# Patient Record
Sex: Female | Born: 1975 | Race: White | Hispanic: No | Marital: Married | Smoking: Current every day smoker
Health system: Southern US, Community
[De-identification: ages and names within clinical notes are randomized; demographics above are authoritative.]

## PROBLEM LIST (undated history)

## (undated) ENCOUNTER — Ambulatory Visit: Admission: EM | Payer: Medicaid Other | Source: Home / Self Care

## (undated) DIAGNOSIS — F329 Major depressive disorder, single episode, unspecified: Secondary | ICD-10-CM

## (undated) DIAGNOSIS — F32A Depression, unspecified: Secondary | ICD-10-CM

## (undated) DIAGNOSIS — D72829 Elevated white blood cell count, unspecified: Secondary | ICD-10-CM

## (undated) DIAGNOSIS — Z720 Tobacco use: Secondary | ICD-10-CM

## (undated) DIAGNOSIS — K7689 Other specified diseases of liver: Secondary | ICD-10-CM

## (undated) HISTORY — DX: Elevated white blood cell count, unspecified: D72.829

## (undated) HISTORY — PX: LAPAROSCOPIC TUBAL LIGATION: SUR803

## (undated) HISTORY — PX: ABDOMINAL HYSTERECTOMY: SHX81

## (undated) HISTORY — PX: INTRAUTERINE DEVICE INSERTION: SHX323

## (undated) NOTE — *Deleted (*Deleted)
Pt. is a 3 year old female with R knee pain. Pt. states she was getting out of the bathtub about 2 weeks ago and felt a snapping sensation in the knee 8/10 knee pain felt. Pt. states the pain has gotten steadily worse, but the brace has helped with pain. Pt. states pain on average 6-7/10 pain. Pain can get down to a 3/10 after prolonged resting. Pt. states that ice makes it worse, has not tried heat. IcyHot does help pain. Pt. has 5 stairs that she needs to use at home, with brace it helps (which was a baseline problem prior after last surgery, slightly more painful now). Pt. states that she noticed swelling in the R knee next day.    Pt. is a 50 y.o. woman with R knee pain after standing up from the ground and feeling a snapping sensation. Pt. went to MD and was given a knee brace that she wears at work and sometimes at home. Pt. was unable to have strength fully formally assessed on R LE due to pain felt in R knee with holding positions, strength tests on R LE are as follows: hip abd: 4/5, knee ext: 3/5, knee flex: 3+/5. Dorsiflexion, plantar flexion, and hip adduction are 5/5 bilat. L LE strength scores are as follows: hip flex: 4+/5, hip abd: 4/5, knee ext: 5/5, knee flex: 4+/5. Pt. demonstrates antalgic gait on R with ambulation with knee brace off. Pt. able to demonstrate full PROM and AROM of bilat LE except demonstrates pain limitation in R knee flex to 95 deg AROM. Pt. tender to palpation around entire knee joint, PT felt palpable snapping at R lateral knee. PT unable to fully assess structural stability of knee due to significant muscle guarding and pt's inability to relax. Pt. positive to R knee Thessalay's in both positions. Pt. given basic motion HEP to begin improving strength and maintain motion in R knee. Pt. will benefit from skilled PT to decrease pain and improve pain free AROM of R knee and to return to work with no pain.    [id number] Supine Heel Slides - 1 x daily - 7 x weekly - 2 sets -  10 reps Seated Isometric Knee Flexion - 1 x daily - 7 x weekly - 2 sets - 10 reps - 10 sec hold Seated Hamstring Set - 1 x daily - 7 x weekly - 2 sets - 10 reps - 10 sec hold Seated Long Arc Quad - 1 x daily - 7 x weekly - 2 sets - 10 reps

---

## 2004-10-21 ENCOUNTER — Emergency Department: Payer: Self-pay | Admitting: Emergency Medicine

## 2004-12-27 ENCOUNTER — Emergency Department: Payer: Self-pay | Admitting: Emergency Medicine

## 2005-03-08 ENCOUNTER — Emergency Department: Payer: Self-pay | Admitting: Unknown Physician Specialty

## 2005-04-09 ENCOUNTER — Emergency Department: Payer: Self-pay | Admitting: Emergency Medicine

## 2005-08-04 ENCOUNTER — Emergency Department: Payer: Self-pay | Admitting: Unknown Physician Specialty

## 2005-09-23 ENCOUNTER — Emergency Department: Payer: Self-pay | Admitting: Emergency Medicine

## 2006-11-20 ENCOUNTER — Emergency Department: Payer: Self-pay | Admitting: Emergency Medicine

## 2007-07-26 ENCOUNTER — Emergency Department: Payer: Self-pay | Admitting: Emergency Medicine

## 2008-02-07 ENCOUNTER — Emergency Department: Payer: Self-pay | Admitting: Internal Medicine

## 2008-03-09 ENCOUNTER — Emergency Department: Payer: Self-pay | Admitting: Emergency Medicine

## 2008-05-06 ENCOUNTER — Emergency Department: Payer: Self-pay | Admitting: Emergency Medicine

## 2008-07-12 ENCOUNTER — Inpatient Hospital Stay: Payer: Self-pay | Admitting: Internal Medicine

## 2008-10-03 DIAGNOSIS — D72829 Elevated white blood cell count, unspecified: Secondary | ICD-10-CM | POA: Insufficient documentation

## 2008-10-03 DIAGNOSIS — J309 Allergic rhinitis, unspecified: Secondary | ICD-10-CM | POA: Insufficient documentation

## 2009-03-03 ENCOUNTER — Emergency Department: Payer: Self-pay | Admitting: Unknown Physician Specialty

## 2009-04-02 ENCOUNTER — Emergency Department: Payer: Self-pay | Admitting: Emergency Medicine

## 2009-07-25 ENCOUNTER — Emergency Department: Payer: Self-pay | Admitting: Emergency Medicine

## 2010-01-08 DIAGNOSIS — Z0000 Encounter for general adult medical examination without abnormal findings: Secondary | ICD-10-CM | POA: Insufficient documentation

## 2010-05-06 ENCOUNTER — Emergency Department: Payer: Self-pay | Admitting: Unknown Physician Specialty

## 2010-08-06 ENCOUNTER — Emergency Department: Payer: Self-pay | Admitting: Emergency Medicine

## 2010-08-22 ENCOUNTER — Emergency Department: Payer: Self-pay | Admitting: Emergency Medicine

## 2011-01-17 ENCOUNTER — Emergency Department: Payer: Self-pay | Admitting: Emergency Medicine

## 2012-06-23 ENCOUNTER — Emergency Department: Payer: Self-pay | Admitting: Emergency Medicine

## 2012-06-23 LAB — URINALYSIS, COMPLETE
Bilirubin,UR: NEGATIVE
Blood: NEGATIVE
Glucose,UR: NEGATIVE mg/dL (ref 0–75)
Ketone: NEGATIVE
Ph: 8 (ref 4.5–8.0)
Squamous Epithelial: 5

## 2012-08-24 ENCOUNTER — Emergency Department: Payer: Self-pay | Admitting: Emergency Medicine

## 2012-09-01 ENCOUNTER — Emergency Department: Payer: Self-pay | Admitting: Emergency Medicine

## 2012-09-03 ENCOUNTER — Ambulatory Visit: Payer: Self-pay | Admitting: Internal Medicine

## 2013-04-25 ENCOUNTER — Emergency Department: Payer: Self-pay | Admitting: Unknown Physician Specialty

## 2013-07-12 ENCOUNTER — Emergency Department: Payer: Self-pay | Admitting: Emergency Medicine

## 2013-10-14 ENCOUNTER — Emergency Department: Payer: Self-pay | Admitting: Emergency Medicine

## 2013-10-14 LAB — COMPREHENSIVE METABOLIC PANEL
ALT: 38 U/L (ref 12–78)
AST: 24 U/L (ref 15–37)
Albumin: 4.2 g/dL (ref 3.4–5.0)
Alkaline Phosphatase: 94 U/L
Anion Gap: 5 — ABNORMAL LOW (ref 7–16)
BILIRUBIN TOTAL: 1.2 mg/dL — AB (ref 0.2–1.0)
BUN: 6 mg/dL — AB (ref 7–18)
CALCIUM: 9 mg/dL (ref 8.5–10.1)
Chloride: 106 mmol/L (ref 98–107)
Co2: 28 mmol/L (ref 21–32)
Creatinine: 0.66 mg/dL (ref 0.60–1.30)
EGFR (Non-African Amer.): >60
GLUCOSE: 86 mg/dL (ref 65–99)
Osmolality: 274 (ref 275–301)
Potassium: 3.9 mmol/L (ref 3.5–5.1)
SODIUM: 139 mmol/L (ref 136–145)
Total Protein: 7.8 g/dL (ref 6.4–8.2)

## 2013-10-14 LAB — URINALYSIS, COMPLETE
BACTERIA: NONE SEEN
BILIRUBIN, UR: NEGATIVE
Glucose,UR: NEGATIVE mg/dL (ref 0–75)
Ketone: NEGATIVE
LEUKOCYTE ESTERASE: NEGATIVE
Nitrite: NEGATIVE
PH: 7 (ref 4.5–8.0)
Protein: NEGATIVE
RBC,UR: 1 /HPF (ref 0–5)
SPECIFIC GRAVITY: 1.004 (ref 1.003–1.030)
Squamous Epithelial: 2

## 2013-10-14 LAB — CBC WITH DIFFERENTIAL/PLATELET
Basophil #: 0.1 10*3/uL (ref 0.0–0.1)
Basophil %: 0.4 %
Eosinophil #: 0.4 10*3/uL (ref 0.0–0.7)
Eosinophil %: 2.5 %
HCT: 46.2 % (ref 35.0–47.0)
HGB: 15.5 g/dL (ref 12.0–16.0)
Lymphocyte #: 2.8 10*3/uL (ref 1.0–3.6)
Lymphocyte %: 17.1 %
MCH: 30.4 pg (ref 26.0–34.0)
MCHC: 33.5 g/dL (ref 32.0–36.0)
MCV: 91 fL (ref 80–100)
Monocyte #: 0.8 x10 3/mm (ref 0.2–0.9)
Monocyte %: 4.8 %
Neutrophil #: 12.4 10*3/uL — ABNORMAL HIGH (ref 1.4–6.5)
Neutrophil %: 75.2 %
Platelet: 404 10*3/uL (ref 150–440)
RBC: 5.1 10*6/uL (ref 3.80–5.20)
RDW: 13.1 % (ref 11.5–14.5)
WBC: 16.5 10*3/uL — ABNORMAL HIGH (ref 3.6–11.0)

## 2013-10-14 LAB — LIPASE, BLOOD: Lipase: 180 U/L (ref 73–393)

## 2013-10-17 DIAGNOSIS — D135 Benign neoplasm of extrahepatic bile ducts: Secondary | ICD-10-CM | POA: Insufficient documentation

## 2013-11-01 ENCOUNTER — Ambulatory Visit: Payer: Self-pay | Admitting: Internal Medicine

## 2014-03-31 ENCOUNTER — Emergency Department: Payer: Self-pay | Admitting: Emergency Medicine

## 2014-03-31 DIAGNOSIS — F17200 Nicotine dependence, unspecified, uncomplicated: Secondary | ICD-10-CM | POA: Insufficient documentation

## 2014-04-28 ENCOUNTER — Emergency Department: Payer: Self-pay | Admitting: Emergency Medicine

## 2014-04-28 LAB — URINALYSIS, COMPLETE
BILIRUBIN, UR: NEGATIVE
GLUCOSE, UR: NEGATIVE mg/dL (ref 0–75)
Ketone: NEGATIVE
Leukocyte Esterase: NEGATIVE
Nitrite: NEGATIVE
PH: 8 (ref 4.5–8.0)
PROTEIN: NEGATIVE
RBC,UR: 1 /HPF (ref 0–5)
Specific Gravity: 1.001 (ref 1.003–1.030)
Squamous Epithelial: 1
WBC UR: NONE SEEN /HPF (ref 0–5)

## 2014-04-28 LAB — CBC
HCT: 45.7 % (ref 35.0–47.0)
HGB: 15.5 g/dL (ref 12.0–16.0)
MCH: 30.9 pg (ref 26.0–34.0)
MCHC: 33.9 g/dL (ref 32.0–36.0)
MCV: 91 fL (ref 80–100)
Platelet: 437 10*3/uL (ref 150–440)
RBC: 5.02 10*6/uL (ref 3.80–5.20)
RDW: 13.2 % (ref 11.5–14.5)
WBC: 19.5 10*3/uL — ABNORMAL HIGH (ref 3.6–11.0)

## 2014-04-28 LAB — DRUG SCREEN, URINE

## 2014-04-29 LAB — COMPREHENSIVE METABOLIC PANEL
AST: 18 U/L (ref 15–37)
Albumin: 4.2 g/dL (ref 3.4–5.0)
Alkaline Phosphatase: 89 U/L
Anion Gap: 6 — ABNORMAL LOW (ref 7–16)
BILIRUBIN TOTAL: 0.7 mg/dL (ref 0.2–1.0)
BUN: 5 mg/dL — ABNORMAL LOW (ref 7–18)
CALCIUM: 8.9 mg/dL (ref 8.5–10.1)
CO2: 29 mmol/L (ref 21–32)
CREATININE: 0.74 mg/dL (ref 0.60–1.30)
Chloride: 105 mmol/L (ref 98–107)
EGFR (African American): >60
EGFR (Non-African Amer.): >60
GLUCOSE: 94 mg/dL (ref 65–99)
Osmolality: 276 (ref 275–301)
POTASSIUM: 3.7 mmol/L (ref 3.5–5.1)
SGPT (ALT): 40 U/L
SODIUM: 140 mmol/L (ref 136–145)
TOTAL PROTEIN: 8 g/dL (ref 6.4–8.2)

## 2014-04-29 LAB — ETHANOL
Ethanol %: <0.003 % (ref 0.000–0.080)
Ethanol: <3 mg/dL

## 2014-04-29 LAB — SALICYLATE LEVEL: Salicylates, Serum: 2.6 mg/dL

## 2014-04-29 LAB — ACETAMINOPHEN LEVEL: Acetaminophen: <2 ug/mL

## 2014-04-29 LAB — SEDIMENTATION RATE: Erythrocyte Sed Rate: 2 mm/hr (ref 0–20)

## 2014-05-26 ENCOUNTER — Emergency Department: Payer: Self-pay | Admitting: Emergency Medicine

## 2014-05-26 LAB — COMPREHENSIVE METABOLIC PANEL
ANION GAP: 6 — AB (ref 7–16)
Albumin: 4 g/dL (ref 3.4–5.0)
Alkaline Phosphatase: 85 U/L
BILIRUBIN TOTAL: 0.5 mg/dL (ref 0.2–1.0)
BUN: 5 mg/dL — ABNORMAL LOW (ref 7–18)
CALCIUM: 9 mg/dL (ref 8.5–10.1)
CREATININE: 0.79 mg/dL (ref 0.60–1.30)
Chloride: 105 mmol/L (ref 98–107)
Co2: 27 mmol/L (ref 21–32)
EGFR (African American): >60
EGFR (Non-African Amer.): >60
GLUCOSE: 90 mg/dL (ref 65–99)
Osmolality: 272 (ref 275–301)
Potassium: 3.5 mmol/L (ref 3.5–5.1)
SGOT(AST): 12 U/L — ABNORMAL LOW (ref 15–37)
SGPT (ALT): 22 U/L
Sodium: 138 mmol/L (ref 136–145)
TOTAL PROTEIN: 7.5 g/dL (ref 6.4–8.2)

## 2014-05-26 LAB — URINALYSIS, COMPLETE
BACTERIA: NONE SEEN
Bilirubin,UR: NEGATIVE
GLUCOSE, UR: NEGATIVE mg/dL (ref 0–75)
KETONE: NEGATIVE
Leukocyte Esterase: NEGATIVE
Nitrite: NEGATIVE
PH: 6 (ref 4.5–8.0)
Protein: NEGATIVE
SPECIFIC GRAVITY: 1.001 (ref 1.003–1.030)
WBC UR: NONE SEEN /HPF (ref 0–5)

## 2014-05-26 LAB — CBC WITH DIFFERENTIAL/PLATELET
Basophil #: 0 10*3/uL (ref 0.0–0.1)
Basophil %: 0.3 %
EOS ABS: 0.1 10*3/uL (ref 0.0–0.7)
Eosinophil %: 0.9 %
HCT: 43.2 % (ref 35.0–47.0)
HGB: 14.1 g/dL (ref 12.0–16.0)
Lymphocyte #: 1.1 10*3/uL (ref 1.0–3.6)
Lymphocyte %: 7.6 %
MCH: 30.3 pg (ref 26.0–34.0)
MCHC: 32.7 g/dL (ref 32.0–36.0)
MCV: 93 fL (ref 80–100)
Monocyte #: 0.8 x10 3/mm (ref 0.2–0.9)
Monocyte %: 5.3 %
NEUTROS ABS: 12.2 10*3/uL — AB (ref 1.4–6.5)
Neutrophil %: 85.9 %
PLATELETS: 343 10*3/uL (ref 150–440)
RBC: 4.65 10*6/uL (ref 3.80–5.20)
RDW: 13.4 % (ref 11.5–14.5)
WBC: 14.2 10*3/uL — ABNORMAL HIGH (ref 3.6–11.0)

## 2014-05-26 LAB — LIPASE, BLOOD: Lipase: 199 U/L (ref 73–393)

## 2014-07-13 ENCOUNTER — Emergency Department: Payer: Self-pay | Admitting: Emergency Medicine

## 2014-09-22 HISTORY — PX: BREAST BIOPSY: SHX20

## 2014-09-24 ENCOUNTER — Ambulatory Visit: Payer: Self-pay | Admitting: Physician Assistant

## 2014-11-01 ENCOUNTER — Ambulatory Visit: Payer: Self-pay | Admitting: Emergency Medicine

## 2014-11-11 ENCOUNTER — Emergency Department: Payer: Self-pay | Admitting: Emergency Medicine

## 2014-11-25 ENCOUNTER — Emergency Department: Payer: Self-pay | Admitting: Emergency Medicine

## 2014-11-29 ENCOUNTER — Ambulatory Visit: Payer: Self-pay | Admitting: Internal Medicine
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
## 2015-02-14 ENCOUNTER — Other Ambulatory Visit: Payer: Self-pay | Admitting: Primary Care

## 2015-02-14 DIAGNOSIS — D134 Benign neoplasm of liver: Secondary | ICD-10-CM

## 2015-02-14 DIAGNOSIS — D135 Benign neoplasm of extrahepatic bile ducts: Principal | ICD-10-CM

## 2015-02-15 ENCOUNTER — Other Ambulatory Visit: Payer: Self-pay | Admitting: Primary Care

## 2015-02-15 DIAGNOSIS — N63 Unspecified lump in unspecified breast: Secondary | ICD-10-CM

## 2015-02-21 ENCOUNTER — Ambulatory Visit
Admission: RE | Admit: 2015-02-21 | Discharge: 2015-02-21 | Disposition: A | Discharge status: Home / Self Care | Payer: Medicaid Other | Source: Ambulatory Visit | Attending: Primary Care | Admitting: Primary Care

## 2015-02-21 DIAGNOSIS — D134 Benign neoplasm of liver: Secondary | ICD-10-CM

## 2015-02-21 DIAGNOSIS — R160 Hepatomegaly, not elsewhere classified: Secondary | ICD-10-CM | POA: Insufficient documentation

## 2015-02-21 DIAGNOSIS — D135 Benign neoplasm of extrahepatic bile ducts: Secondary | ICD-10-CM

## 2015-02-21 HISTORY — PX: KNEE CARTILAGE SURGERY: SHX688

## 2015-02-22 ENCOUNTER — Ambulatory Visit
Admission: RE | Admit: 2015-02-22 | Discharge: 2015-02-22 | Disposition: A | Discharge status: Home / Self Care | Payer: Medicaid Other | Source: Ambulatory Visit | Attending: Primary Care | Admitting: Primary Care

## 2015-02-22 DIAGNOSIS — N63 Unspecified lump in unspecified breast: Secondary | ICD-10-CM

## 2015-02-22 DIAGNOSIS — N641 Fat necrosis of breast: Secondary | ICD-10-CM | POA: Diagnosis not present

## 2015-02-26 ENCOUNTER — Other Ambulatory Visit: Payer: Self-pay | Admitting: Primary Care

## 2015-02-26 DIAGNOSIS — N63 Unspecified lump in unspecified breast: Secondary | ICD-10-CM

## 2015-02-26 DIAGNOSIS — R928 Other abnormal and inconclusive findings on diagnostic imaging of breast: Secondary | ICD-10-CM

## 2015-02-27 ENCOUNTER — Other Ambulatory Visit: Payer: Self-pay | Admitting: Primary Care

## 2015-02-27 DIAGNOSIS — R928 Other abnormal and inconclusive findings on diagnostic imaging of breast: Secondary | ICD-10-CM

## 2015-02-27 DIAGNOSIS — N63 Unspecified lump in unspecified breast: Secondary | ICD-10-CM

## 2015-02-28 ENCOUNTER — Ambulatory Visit
Admission: RE | Admit: 2015-02-28 | Discharge: 2015-02-28 | Disposition: A | Discharge status: Home / Self Care | Payer: Medicaid Other | Source: Ambulatory Visit | Attending: Primary Care | Admitting: Primary Care

## 2015-02-28 ENCOUNTER — Other Ambulatory Visit: Payer: Self-pay | Admitting: Diagnostic Radiology

## 2015-02-28 ENCOUNTER — Ambulatory Visit: Payer: Medicaid Other

## 2015-02-28 DIAGNOSIS — R921 Mammographic calcification found on diagnostic imaging of breast: Secondary | ICD-10-CM | POA: Diagnosis not present

## 2015-02-28 DIAGNOSIS — R928 Other abnormal and inconclusive findings on diagnostic imaging of breast: Secondary | ICD-10-CM

## 2015-02-28 DIAGNOSIS — N63 Unspecified lump in unspecified breast: Secondary | ICD-10-CM

## 2015-02-28 DIAGNOSIS — N641 Fat necrosis of breast: Secondary | ICD-10-CM | POA: Insufficient documentation

## 2015-02-28 DIAGNOSIS — N6031 Fibrosclerosis of right breast: Secondary | ICD-10-CM | POA: Diagnosis not present

## 2015-03-01 LAB — SURGICAL PATHOLOGY

## 2015-05-30 ENCOUNTER — Ambulatory Visit
Admission: EM | Admit: 2015-05-30 | Discharge: 2015-05-30 | Disposition: A | Discharge status: Home / Self Care | Payer: Medicaid Other | Attending: Family Medicine | Admitting: Family Medicine

## 2015-05-30 DIAGNOSIS — B349 Viral infection, unspecified: Secondary | ICD-10-CM | POA: Insufficient documentation

## 2015-05-30 DIAGNOSIS — Z79899 Other long term (current) drug therapy: Secondary | ICD-10-CM | POA: Diagnosis not present

## 2015-05-30 DIAGNOSIS — F17210 Nicotine dependence, cigarettes, uncomplicated: Secondary | ICD-10-CM | POA: Insufficient documentation

## 2015-05-30 DIAGNOSIS — J029 Acute pharyngitis, unspecified: Secondary | ICD-10-CM | POA: Diagnosis present

## 2015-05-30 LAB — RAPID STREP SCREEN (MED CTR MEBANE ONLY): Streptococcus, Group A Screen (Direct): NEGATIVE

## 2015-05-30 MED ORDER — LORATADINE 10 MG PO TABS: 10.0000 mg | ORAL_TABLET | Freq: Every day | ORAL | Status: DC

## 2015-05-30 MED ORDER — FLUTICASONE PROPIONATE 50 MCG/ACT NA SUSP: 2.0000 | Freq: Every day | NASAL | Status: DC

## 2015-05-30 MED ORDER — KETOROLAC TROMETHAMINE 60 MG/2ML IM SOLN
60.0000 mg | Freq: Once | INTRAMUSCULAR | Status: AC
  Administered 2015-05-30: 60 mg via INTRAMUSCULAR

## 2015-05-30 MED ORDER — MAGIC MOUTHWASH W/LIDOCAINE: 5.0000 mL | Freq: Three times a day (TID) | ORAL | Status: DC | PRN

## 2015-05-30 NOTE — ED Notes (Signed)
Patient states that she is having mouth pain that started Monday. She has redness in gums. She states that she has redness around one tooth so she is not sure if she could be having an abscess. She states that she was also exposed to hand foot and mouth. She states that she has been having a headache, facial pain, side of neck and ear pain on right side. She states that she is having a hard time drinking something that is cold in temp.

## 2015-05-30 NOTE — Discharge Instructions (Signed)
Ibuprofen 600 mg up to 3 times a day with meals as needed for pain Sore throat lozenges Salt water gargles Claritin  1 tablet daily  Flonase 1 spray per nostril  per day  Oral Ulcers Oral ulcers are painful, shallow sores around the lining of the mouth. They can affect the gums, the inside of the lips, and the cheeks. (Sores on the outside of the lips and on the face are different.) They typically first occur in school-aged children and teenagers. Oral ulcers may also be called canker sores or cold sores. CAUSES  Canker sores and cold sores can be caused by many factors including:  Infection.  Injury.  Sun exposure.  Medications.  Emotional stress.  Food allergies.  Vitamin deficiencies.  Toothpastes containing sodium lauryl sulfate. The herpes virus can be the cause of mouth ulcers. The first infection can be severe and cause 10 or more ulcers on the gums, tongue, and lips with fever and difficulty in swallowing. This infection usually occurs between the ages of 31 and 3 years.  SYMPTOMS  The typical sore is about  inch (6 mm) in size and is an oval or round ulcer with red borders. DIAGNOSIS  Your caregiver can diagnose simple oral ulcers by examination. Additional testing is usually not required.  TREATMENT  Treatment is aimed at pain relief. Generally, oral ulcers resolve by themselves within 1 to 2 weeks without medication and are not contagious unless caused by herpes (and other viruses). Antibiotics are not effective with mouth sores. Avoid direct contact with others until the ulcer is completely healed. See your caregiver for follow-up care as recommended. Also:  Offer a soft diet.  Encourage plenty of fluids to prevent dehydration. Popsicles and milk shakes can be helpful.  Avoid acidic and salty foods and drinks such as orange juice.  Infants and young children will often refuse to drink because of pain. Using a teaspoon, cup, or syringe to give small amounts of  fluids frequently can help prevent dehydration.  Cold compresses on the face may help reduce pain.  Pain medication can help control soreness.  A solution of diphenhydramine mixed with a liquid antacid can be useful to decrease the soreness of ulcers. Consult a caregiver for the dosing.  Liquids or ointments with a numbing ingredient may be helpful when used as recommended.  Older children and teenagers can rinse their mouth with a salt-water mixture (1/2 teaspoon of salt in 8 ounces of water) four times a day. This treatment is uncomfortable but may reduce the time the ulcers are present.  There are many over-the-counter throat lozenges and medications available for oral ulcers. Their effectiveness has not been studied.  Consult your medical caregiver prior to using homeopathic treatments for oral ulcers. SEEK MEDICAL CARE IF:   You think your child needs to be seen.  The pain worsens and you cannot control it.  There are 4 or more ulcers.  The lips and gums begin to bleed and crust.  A single mouth ulcer is near a tooth that is causing a toothache or pain.  Your child has a fever, swollen face, or swollen glands.  The ulcers began after starting a medication.  Mouth ulcers keep reoccurring or last more than 2 weeks.  You think your child is not taking adequate fluids. SEEK IMMEDIATE MEDICAL CARE IF:   Your child has a high fever.  Your child is unable to swallow or becomes dehydrated.  Your child looks or acts very ill.  An ulcer caused by a chemical your child accidentally put in their mouth. Document Released: 10/16/2004 Document Revised: 01/23/2014 Document Reviewed: 05/31/2009 [facility] Patient Information 2015 [place], [place]. This information is not intended to replace advice given to you by your health care provider. Make sure you discuss any questions you have with your health care provider.

## 2015-05-30 NOTE — ED Provider Notes (Signed)
CSN: [id number]     Arrival date & time 05/30/15  1748 History   First MD Initiated Contact with Patient 05/30/15 1900     Chief Complaint  Patient presents with  . Oral Pain   (Consider location/radiation/quality/duration/timing/severity/associated sxs/prior Treatment) HPI  39 yo F with 3 day history of sore throat, right ear fulness and now raw sores in mouth.Neighbors have had  Hand foot mouth. She has had post nasal drip and scratchy throat-now ears feel "clogged". Low grade temp.  Aching muscles.Smoker-continues- discouraged- irritating to mouth lesions No past medical history on file. Past Surgical History  Procedure Laterality Date  . Knee cartilage surgery Right 02/2015   Family History  Problem Relation Age of Onset  . Breast cancer Maternal Grandmother    Social History  Substance Use Topics  . Smoking status: Smoker, Current Status Unknown -- 1.00 packs/day for 7 years    Types: Cigarettes  . Smokeless tobacco: None  . Alcohol Use: No   OB History    No data available     Review of Systems Review of 10 systems negative for acute change except as referenced in HPI Allergies  Review of patient's allergies indicates no known allergies.  Home Medications   Prior to Admission medications   Medication Sig Start Date End Date Taking? Authorizing Provider  fluticasone (FLONASE) 50 MCG/ACT nasal spray Place 2 sprays into both nostrils daily. 05/30/15   Jan Fireman, PA-C  loratadine (CLARITIN) 10 MG tablet Take 1 tablet (10 mg total) by mouth daily. 05/30/15   Jan Fireman, PA-C  magic mouthwash w/lidocaine SOLN Take 5 mLs by mouth 3 (three) times daily as needed for mouth pain. Hold, swish ,spit - rinse with water and spit out 05/30/15   Jan Fireman, PA-C   Meds Ordered and Administered this Visit   Medications  ketorolac (TORADOL) injection 60 mg (60 mg Intramuscular Given 05/30/15 1918)  Feels much better !!  LMP 05/30/2015 No data found.   Physical Exam     Constitutional -alert and oriented,ill appearing, mild distress Head-atraumatic, normocephalic, transient mild headache last few days-not now Eyes- conjunctiva normal, EOMI ,conjugate gaze Ears- right TM retracted, dull -left grossly wnl Nose- no congestion, clear rhinorrhea Mouth/throat- mucous membranes moist ,oropharynx erythematous, Rapid Strep neg-,oral ulcers on soft palate, right upper gumline and tongue Neck- supple with 1-2+ glandular enlargement bilat CV- regular rate, grossly normal heart sounds,  Resp-no distress, normal respiratory effort,clear to auscultation bilaterally GI- soft,non-tender,no distention GU-  not examined MSK- no tender, normal ROM, all extremities, ambulatory, self-care- no evidence of lesions on palms/hands or soles/feet at this time; Well healed surgical sites right knee from recent laparoscopic exploratory procedure Neuro- normal speech and language, no gross focal neurological deficit appreciated,  Skin-warm,dry ,intact; no rash noted Psych-mood and affect grossly normal; speech and behavior grossly normal  ED Course  Procedures (including critical care time)  Labs Review Labs Reviewed  RAPID STREP SCREEN (NOT AT [facility])  CULTURE, GROUP A STREP ([place])   Results for orders placed or performed during the hospital encounter of 05/30/15  Rapid strep screen  Result Value Ref Range   Streptococcus, Group A Screen (Direct) NEGATIVE NEGATIVE  Culture, group A strep ([facility] only)  Result Value Ref Range   Specimen Description THROAT    Special Requests NONE    Culture NO BETA HEMOLYTIC STREPTOCOCCI ISOLATED    Report Status 06/01/2015 FINAL    Imaging Review No results found.  Patient familiar  with [facility] and reports the local neighborhood is having an outbreak. Discussed virus transmission. Tincture of time for recovery- treat symptoms. Ibuprofen works for pain and inflammation if OK with her Ortho to use it. Mouthwash ordered-keep away from  kids. Have encouraged her to start seasonal allergy Rx trial with Claritin and Flonase-- for ETD and she has some general  Symptoms c/w allergy diagnosis. If feel improved continue until [place] has a hard freeze.    MDM   1. Viral syndrome    Diagnosis and treatment discussed. Viral syndrome- Hand,Foot ,Mouth common right now.  Treat symptoms-drink fluids-get rest.Mouthwash ordered- keep away from children. Should pass in 7-10 days .  Questions fielded, expectations and recommendations reviewed. Patient expresses understanding.  Will return to [facility] with questions, concern or exacerbation or prolonged course.   Discharge Medication List as of 05/30/2015  7:54 PM    START taking these medications   Details  fluticasone (FLONASE) 50 MCG/ACT nasal spray Place 2 sprays into both nostrils daily., Starting 05/30/2015, Until Discontinued, Normal    loratadine (CLARITIN) 10 MG tablet Take 1 tablet (10 mg total) by mouth daily., Starting 05/30/2015, Until Discontinued, Normal    !! magic mouthwash w/lidocaine SOLN Take 5 mLs by mouth 3 (three) times daily as needed for mouth pain. Hold, swish ,spit - rinse with water and spit out, Starting 05/30/2015, Until Discontinued, Print    !! magic mouthwash w/lidocaine SOLN Take 5 mLs by mouth 3 (three) times daily as needed for mouth pain. Hold swish and spit  - rinse mouth with water and spit again., Starting 05/30/2015, Until Discontinued, Print     !! - Potential duplicate medications found. Please discuss with provider.      Jan Fireman, PA-C 06/02/15 1256

## 2015-06-01 LAB — CULTURE, GROUP A STREP (THRC)

## 2015-06-02 ENCOUNTER — Encounter: Payer: Self-pay | Admitting: Physician Assistant

## 2015-09-17 ENCOUNTER — Encounter: Payer: Self-pay | Admitting: Emergency Medicine

## 2015-09-17 ENCOUNTER — Emergency Department
Admission: EM | Admit: 2015-09-17 | Discharge: 2015-09-17 | Disposition: A | Discharge status: Home / Self Care | Payer: Medicaid Other | Attending: Emergency Medicine | Admitting: Emergency Medicine

## 2015-09-17 ENCOUNTER — Emergency Department: Payer: Medicaid Other

## 2015-09-17 DIAGNOSIS — F17210 Nicotine dependence, cigarettes, uncomplicated: Secondary | ICD-10-CM | POA: Insufficient documentation

## 2015-09-17 DIAGNOSIS — J40 Bronchitis, not specified as acute or chronic: Secondary | ICD-10-CM | POA: Insufficient documentation

## 2015-09-17 DIAGNOSIS — R05 Cough: Secondary | ICD-10-CM | POA: Diagnosis present

## 2015-09-17 MED ORDER — IPRATROPIUM-ALBUTEROL 0.5-2.5 (3) MG/3ML IN SOLN
3.0000 mL | Freq: Once | RESPIRATORY_TRACT | Status: AC
  Administered 2015-09-17: 3 mL via RESPIRATORY_TRACT
  Filled 2015-09-17 (×2): qty 3

## 2015-09-17 MED ORDER — PREDNISONE 10 MG (21) PO TBPK: 10.0000 mg | ORAL_TABLET | Freq: Every day | ORAL | Status: DC

## 2015-09-17 MED ORDER — ALBUTEROL SULFATE HFA 108 (90 BASE) MCG/ACT IN AERS: 2.0000 | INHALATION_SPRAY | Freq: Four times a day (QID) | RESPIRATORY_TRACT | Status: DC | PRN

## 2015-09-17 MED ORDER — BENZONATATE 200 MG PO CAPS: 200.0000 mg | ORAL_CAPSULE | Freq: Three times a day (TID) | ORAL | Status: DC | PRN

## 2015-09-17 MED ORDER — PREDNISONE 20 MG PO TABS
60.0000 mg | ORAL_TABLET | Freq: Once | ORAL | Status: AC
  Administered 2015-09-17: 60 mg via ORAL
  Filled 2015-09-17: qty 3

## 2015-09-17 NOTE — ED Provider Notes (Signed)
[facility] Emergency Department Provider Note     Time seen: ----------------------------------------- 7:10 AM on 09/17/2015 -----------------------------------------    I have reviewed the triage vital signs and the nursing notes.   HISTORY  Chief Complaint Cough and Sinus Problem    HPI [name] is a 39 y.o. female presents ER with intermittent reactive cough of green and yellow sinus drainage. Patient states she's daily smoker but has had cigarette days she's been feeling bad. Her main complaint is feeling like she can't breathe very well. Coughing seems to make her symptoms worse. She states she's had intermittent fever. History reviewed. No pertinent past medical history.  There are no active problems to display for this patient.   Past Surgical History  Procedure Laterality Date  . Knee cartilage surgery Right 02/2015    Allergies Review of patient's allergies indicates no known allergies.  Social History Social History  Substance Use Topics  . Smoking status: Current Every Day Smoker -- 1.00 packs/day for 7 years    Types: Cigarettes  . Smokeless tobacco: None  . Alcohol Use: No    Review of Systems Constitutional: Positive for fever Eyes: Negative for visual changes. ENT: Negative for sore throat. Cardiovascular: Negative for chest pain. Respiratory: Positive for shortness of breath and cough Gastrointestinal: Negative for abdominal pain, vomiting and diarrhea. Genitourinary: Negative for dysuria. Musculoskeletal: Negative for back pain. Skin: Negative for rash. Neurological: Negative for headaches, focal weakness or numbness.  10-point ROS otherwise negative.  ____________________________________________   PHYSICAL EXAM:  VITAL SIGNS: ED Triage Vitals  Enc Vitals Group     BP 09/17/15 0536 142/84 mmHg     Pulse Rate 09/17/15 0536 110     Resp 09/17/15 0536 18     Temp 09/17/15 0536 99 F (37.2 C)   Temp Source 09/17/15 0536 Oral     SpO2 09/17/15 0536 96 %     Weight 09/17/15 0536 154 lb (69.854 kg)     Height 09/17/15 0536 5\' 2"  (1.575 m)     Head Cir --      Peak Flow --      Pain Score 09/17/15 0537 0     Pain Loc --      Pain Edu? --      Excl. in [place]? --     Constitutional: Alert and oriented. Well appearing and in no distress. Eyes: Conjunctivae are normal. PERRL. Normal extraocular movements. ENT   Head: Normocephalic and atraumatic.   Nose: No congestion/rhinnorhea.   Mouth/Throat: Mucous membranes are moist.   Neck: No stridor. Cardiovascular: Normal rate, regular rhythm. Normal and symmetric distal pulses are present in all extremities. No murmurs, rubs, or gallops. Respiratory: Normal respiratory effort without tachypnea nor retractions. Bilateral rhonchi Gastrointestinal: Soft and nontender. No distention. No abdominal bruits.  Musculoskeletal: Nontender with normal range of motion in all extremities. No joint effusions.  No lower extremity tenderness nor edema. Neurologic:  Normal speech and language. No gross focal neurologic deficits are appreciated. Speech is normal. No gait instability. Skin:  Skin is warm, dry and intact. No rash noted. Psychiatric: Mood and affect are normal. Speech and behavior are normal. Patient exhibits appropriate insight and judgment.  ____________________________________________  ED COURSE:  Pertinent labs & imaging results that were available during my care of the patient were reviewed by me and considered in my medical decision making (see chart for details). Patient is in no acute distress, did have some wheezing and was given a DuoNeb  here. Chest x-ray will be ordered ____________________________________________  RADIOLOGY  Chest x-ray is unremarkable  ____________________________________________  FINAL ASSESSMENT AND PLAN  Bronchitis  Plan: Patient with labs and imaging as dictated above. Patient with some  bronchitis, she'll be given albuterol and prednisone as well cough medication. This point she does not need antibiotics but will need close follow-up with her primary care doctor.   Earleen Newport, MD   Earleen Newport, MD 09/17/15 [phone]

## 2015-09-17 NOTE — ED Notes (Addendum)
Pt c/o 3 days of intermittent productive cough with green and yellow sinus drainage; ambulatory with steady gait; talking in complete coherent sentences; pt says she is a daily smoker but hasn't had a cigarette in several days as she's been feeling bad

## 2015-09-17 NOTE — ED Notes (Signed)
Pharmacy called for meds.  

## 2015-09-17 NOTE — Discharge Instructions (Signed)

## 2015-10-11 ENCOUNTER — Ambulatory Visit: Payer: Medicaid Other

## 2015-10-16 ENCOUNTER — Encounter: Payer: Self-pay | Admitting: Internal Medicine

## 2015-10-16 ENCOUNTER — Inpatient Hospital Stay: Payer: Medicaid Other

## 2015-10-16 ENCOUNTER — Inpatient Hospital Stay: Payer: Medicaid Other | Attending: Internal Medicine | Admitting: Internal Medicine

## 2015-10-16 VITALS — BP 122/82 | HR 94 | Temp 98.4°F | Ht 62.0 in | Wt 153.0 lb

## 2015-10-16 DIAGNOSIS — D72829 Elevated white blood cell count, unspecified: Secondary | ICD-10-CM

## 2015-10-16 DIAGNOSIS — Z79899 Other long term (current) drug therapy: Secondary | ICD-10-CM | POA: Diagnosis not present

## 2015-10-16 DIAGNOSIS — F17210 Nicotine dependence, cigarettes, uncomplicated: Secondary | ICD-10-CM | POA: Diagnosis not present

## 2015-10-16 LAB — COMPREHENSIVE METABOLIC PANEL
ALT: 20 U/L (ref 14–54)
AST: 19 U/L (ref 15–41)
Albumin: 4.9 g/dL (ref 3.5–5.0)
Alkaline Phosphatase: 91 U/L (ref 38–126)
Anion gap: 11 (ref 5–15)
BILIRUBIN TOTAL: 1.1 mg/dL (ref 0.3–1.2)
BUN: 10 mg/dL (ref 6–20)
CO2: 27 mmol/L (ref 22–32)
CREATININE: 0.58 mg/dL (ref 0.44–1.00)
Calcium: 9.7 mg/dL (ref 8.9–10.3)
Chloride: 99 mmol/L — ABNORMAL LOW (ref 101–111)
GFR calc Af Amer: >60 mL/min (ref >60)
GFR calc non Af Amer: >60 mL/min (ref >60)
GLUCOSE: 75 mg/dL (ref 65–99)
Potassium: 3.7 mmol/L (ref 3.5–5.1)
Sodium: 137 mmol/L (ref 135–145)
TOTAL PROTEIN: 8.4 g/dL — AB (ref 6.5–8.1)

## 2015-10-16 LAB — CBC WITH DIFFERENTIAL/PLATELET
BASOS ABS: 0.1 10*3/uL (ref 0–0.1)
Basophils Relative: 1 %
Eosinophils Absolute: 0.5 10*3/uL (ref 0–0.7)
Eosinophils Relative: 4 %
HEMATOCRIT: 47.6 % — AB (ref 35.0–47.0)
Hemoglobin: 16.1 g/dL — ABNORMAL HIGH (ref 12.0–16.0)
LYMPHS PCT: 23 %
Lymphs Abs: 3.2 10*3/uL (ref 1.0–3.6)
MCH: 30.5 pg (ref 26.0–34.0)
MCHC: 33.9 g/dL (ref 32.0–36.0)
MCV: 90.1 fL (ref 80.0–100.0)
MONO ABS: 0.7 10*3/uL (ref 0.2–0.9)
Monocytes Relative: 5 %
NEUTROS ABS: 9.7 10*3/uL — AB (ref 1.4–6.5)
NEUTROS PCT: 69 %
Platelets: 494 10*3/uL — ABNORMAL HIGH (ref 150–440)
RBC: 5.28 MIL/uL — AB (ref 3.80–5.20)
RDW: 13.7 % (ref 11.5–14.5)
WBC: 14.2 10*3/uL — ABNORMAL HIGH (ref 3.6–11.0)

## 2015-10-16 LAB — LACTATE DEHYDROGENASE: LDH: 111 U/L (ref 98–192)

## 2015-10-16 NOTE — Progress Notes (Signed)
[facility] NOTE  Patient Care Team: [name], NP as PCP - General (Nurse Practitioner)  CHIEF COMPLAINTS/PURPOSE OF CONSULTATION:   # Leucocytosis/ mild 14-19k   # ? Right lobe of liver lesion- 10.2 x 5.3x 5.7 cm echogenic lesion [US- Jan 2015; increase from 4.3cm from 2009]; worked at [employer]- neg per pt- No bx.] HISTORY OF PRESENTING ILLNESS:  [name] 40 y.o.  female long-standing history of smoking has been referred was for evaluation of leukocytosis. Patient states that she had elevated white count on the way back 2004 when she was admitted to the hospital for shortness of breath.   Intermittently she continues to have elevated white count; but she never had any hematology referral. Patient denies any significant weight loss. Denies any significant night sweats. No continuous fevers. She had episode of bronchitis in December 2016 when she was on steroid taper. Otherwise she is not on any long-term steroids. She does not take any long-term medications.    ROS: A complete 10 point review of system is done which is negative except mentioned above in history of present illness  MEDICAL HISTORY:  No past medical history on file. reflux disease  SURGICAL HISTORY: Breast biopsy negative. Past Surgical History  Procedure Laterality Date  . Knee cartilage surgery Right 02/2015    SOCIAL HISTORY: Smokes pack of cigarettes a day; occasional alcohol. She works at [employer] History  . Marital Status: Married    Spouse Name: N/A  . Number of Children: N/A  . Years of Education: N/A   Occupational History  . Not on file.   Social History Main Topics  . Smoking status: Current Every Day Smoker -- 1.00 packs/day for 7 years    Types: Cigarettes  . Smokeless tobacco: Not on file  . Alcohol Use: No  . Drug Use: No  . Sexual Activity: Not on file   Other Topics Concern  . Not on file   Social History Narrative     FAMILY HISTORY: Family History  Problem Relation Age of Onset  . Breast cancer Maternal Grandmother     ALLERGIES:  has No Known Allergies.  MEDICATIONS:  Current Outpatient Prescriptions  Medication Sig Dispense Refill  . albuterol (PROVENTIL HFA;VENTOLIN HFA) 108 (90 BASE) MCG/ACT inhaler Inhale 2 puffs into the lungs every 6 (six) hours as needed for wheezing or shortness of breath. 1 Inhaler 2  . benzonatate (TESSALON) 200 MG capsule Take 1 capsule (200 mg total) by mouth 3 (three) times daily as needed for cough. 20 capsule 0  . omeprazole (PRILOSEC) 20 MG capsule Take 20 mg by mouth daily.    . predniSONE (STERAPRED UNI-PAK 21 TAB) 10 MG (21) TBPK tablet Take 1 tablet (10 mg total) by mouth daily. Take one steroid taper pak as directed 21 tablet 0   No current facility-administered medications for this visit.      Marland Kitchen  PHYSICAL EXAMINATION:   Filed Vitals:   10/16/15 1352  BP: 122/82  Pulse: 94  Temp: 98.4 F (36.9 C)   Filed Weights   10/16/15 1352  Weight: 153 lb (69.4 kg)    GENERAL: Well-nourished well-developed; Alert, no distress and accompanied by family. ES: no pallor or icterus OROPHARYNX: no thrush or ulceration; good dentition  NECK: supple, no masses felt LYMPH:  no palpable lymphadenopathy in the cervical, axillary or inguinal regions LUNGS: clear to auscultation and  No wheeze or crackles HEART/CVS: regular rate &  rhythm and no murmurs; No lower extremity edema ABDOMEN: abdomen soft, non-tender and normal bowel sounds Musculoskeletal:no cyanosis of digits and no clubbing  PSYCH: alert & oriented x 3 with fluent speech NEURO: no focal motor/sensory deficits SKIN:  no rashes or significant lesions  LABORATORY DATA:  I have reviewed the data as listed Lab Results  Component Value Date   WBC 14.2* 05/26/2014   HGB 14.1 05/26/2014   HCT 43.2 05/26/2014   MCV 93 05/26/2014   PLT 343 05/26/2014   No results for input(s): NA, K, CL, CO2,  GLUCOSE, BUN, CREATININE, CALCIUM, GFRNONAA, GFRAA, PROT, ALBUMIN, AST, ALT, ALKPHOS, BILITOT, BILIDIR, IBILI in the last 8760 hours.  RADIOGRAPHIC STUDIES: I have personally reviewed the radiological images as listed and agreed with the findings in the report. Dg Chest 2 View  09/17/2015  CLINICAL DATA:  Productive cough with sinus drainage for 3 days. Smoker. EXAM: CHEST  2 VIEW COMPARISON:  07/26/2007 radiographs. FINDINGS: The heart size and mediastinal contours are stable. There is mild central airway thickening, but no hyperinflation, confluent airspace opacity or pleural effusion. The bones appear unchanged. IMPRESSION: No acute cardiopulmonary process. Mild chronic central airway thickening. Electronically Signed   By: Richardean Sale M.D.   On: 09/17/2015 07:08    ASSESSMENT & PLAN:   # Mild Leukocytosis- predominant neutrophilia; likely secondary from smoking/intermittent infections.  Recommend checking for malignancies - flow cytometry of peripheral blood; also BCR ABL. Check peripheral smear.   Patient follow-up with me in approximately 3 weeks or so to discuss the above blood work.   All questions were answered. The patient knows to call the clinic with any problems, questions or concerns.  # 30 minutes face-to-face with the patient discussing the above plan of care; more than 50% of time spent on counseling and coordination of care.      Cammie Sickle, MD 10/16/2015 2:01 PM

## 2015-10-16 NOTE — Patient Instructions (Addendum)
You Can Quit Smoking If you are ready to quit smoking or are thinking about it, congratulations! You have chosen to help yourself be healthier and live longer! There are lots of different ways to quit smoking. Nicotine gum, nicotine patches, a nicotine inhaler, or nicotine nasal spray can help with physical craving. Hypnosis, support groups, and medicines help break the habit of smoking. TIPS TO GET OFF AND STAY OFF CIGARETTES  Learn to predict your moods. Do not let a bad situation be your excuse to have a cigarette. Some situations in your life might tempt you to have a cigarette.  Ask friends and co-workers not to smoke around you.  Make your home smoke-free.  Never have "just one" cigarette. It leads to wanting another and another. Remind yourself of your decision to quit.  On a card, make a list of your reasons for not smoking. Read it at least the same number of times a day as you have a cigarette. Tell yourself everyday, "I do not want to smoke. I choose not to smoke."  Ask someone at home or work to help you with your plan to quit smoking.  Have something planned after you eat or have a cup of coffee. Take a walk or get other exercise to perk you up. This will help to keep you from overeating.  Try a relaxation exercise to calm you down and decrease your stress. Remember, you may be tense and nervous the first two weeks after you quit. This will pass.  Find new activities to keep your hands busy. Play with a pen, coin, or rubber band. Doodle or draw things on paper.  Brush your teeth right after eating. This will help cut down the craving for the taste of tobacco after meals. You can try mouthwash too.  Try gum, breath mints, or diet candy to keep something in your mouth. IF YOU SMOKE AND WANT TO QUIT:  Do not stock up on cigarettes. Never buy a carton. Wait until one pack is finished before you buy another.  Never carry cigarettes with you at work or at home.  Keep cigarettes  as far away from you as possible. Leave them with someone else.  Never carry matches or a lighter with you.  Ask yourself, "Do I need this cigarette or is this just a reflex?"  Bet with someone that you can quit. Put cigarette money in a piggy bank every morning. If you smoke, you give up the money. If you do not smoke, by the end of the week, you keep the money.  Keep trying. It takes 21 days to change a habit!  Talk to your doctor about using medicines to help you quit. These include nicotine replacement gum, lozenges, or skin patches.   This information is not intended to replace advice given to you by your health care provider. Make sure you discuss any questions you have with your health care provider.   Document Released: 07/05/2009 Document Revised: 12/01/2011 Document Reviewed: 07/05/2009 Elsevier Interactive Patient Education 2016 Elsevier Inc.     Leukocytosis Leukocytosis means you have more white blood cells than normal. White blood cells are made in your bone marrow. The main job of white blood cells is to fight infection. Having too many white blood cells is a common condition. It can develop as a result of many types of medical problems. CAUSES  In some cases, your bone marrow may be normal, but it is still making too many white blood cells. This  could be the result of:  Infection.  Injury.  Physical stress.  Emotional stress.  Surgery.  Allergic reactions.  Tumors that do not start in the blood or bone marrow.  An inherited disease.  Certain medicines.  Pregnancy and labor. In other cases, you may have a bone marrow disorder that is causing your body to make too many white blood cells. Bone marrow disorders include:  Leukemia. This is a type of blood cancer.  Myeloproliferative disorders. These disorders cause blood cells to grow abnormally. SYMPTOMS  Some people have no symptoms. Others have symptoms due to the medical problem that is causing their  leukocytosis. These symptoms may include:  Bleeding.  Bruising.  Fever.  Night sweats.  Repeated infections.  Weakness.  Weight loss. DIAGNOSIS  Leukocytosis is often found during blood tests that are done as part of a normal physical exam. Your caregiver will probably order other tests to help determine why you have too many white blood cells. These tests may include:  A complete blood count (CBC). This test measures all the types of blood cells in your body.  Chest X-rays, urine tests (urinalysis), or other tests to look for signs of infection.  Bone marrow aspiration. For this test, a needle is put into your bone. Cells from the bone marrow are removed through the needle. The cells are then examined under a microscope. TREATMENT  Treatment is usually not needed for leukocytosis. However, if a disorder is causing your leukocytosis, it will need to be treated. Treatment may include:  Antibiotic medicines if you have a bacterial infection.  Bone marrow transplant. Your diseased bone marrow is replaced with healthy cells that will grow new bone marrow.  Chemotherapy. This is the use of drugs to kill cancer cells. HOME CARE INSTRUCTIONS  Only take over-the-counter or prescription medicines as directed by your caregiver.  Maintain a healthy weight. Ask your caregiver what weight is best for you.  Eat foods that are low in saturated fats and high in fiber. Eat plenty of fruits and vegetables.  Drink enough fluids to keep your urine clear or pale yellow.  Get 30 minutes of exercise at least 5 times a week. Check with your caregiver before starting a new exercise routine.  Limit caffeine and alcohol.  Do not smoke.  Keep all follow-up appointments as directed by your caregiver. SEEK MEDICAL CARE IF:  You feel weak or more tired than usual.  You develop chills, a cough, or nasal congestion.  You lose weight without trying.  You have night sweats.  You bruise  easily. SEEK IMMEDIATE MEDICAL CARE IF:  You bleed more than normal.  You have chest pain.  You have trouble breathing.  You have a fever.  You have uncontrolled nausea or vomiting.  You feel dizzy or lightheaded. MAKE SURE YOU:  Understand these instructions.  Will watch your condition.  Will get help right away if you are not doing well or get worse.   This information is not intended to replace advice given to you by your health care provider. Make sure you discuss any questions you have with your health care provider.   Document Released: 08/28/2011 Document Revised: 12/01/2011 Document Reviewed: 03/12/2015 Elsevier Interactive Patient Education [employer].

## 2015-10-17 DIAGNOSIS — F32A Depression, unspecified: Secondary | ICD-10-CM | POA: Insufficient documentation

## 2015-10-17 LAB — C-REACTIVE PROTEIN: CRP: 1.9 mg/dL — ABNORMAL HIGH (ref <1.0)

## 2015-10-19 LAB — COMP PANEL: LEUKEMIA/LYMPHOMA

## 2015-10-22 LAB — BCR-ABL1 FISH
Cells Analyzed: 200
Cells Counted: 200

## 2015-11-06 ENCOUNTER — Encounter: Payer: Self-pay | Admitting: Internal Medicine

## 2015-11-06 ENCOUNTER — Inpatient Hospital Stay: Payer: Medicaid Other | Attending: Internal Medicine | Admitting: Internal Medicine

## 2015-11-06 VITALS — BP 151/81 | HR 56 | Temp 98.7°F | Wt 149.6 lb

## 2015-11-06 DIAGNOSIS — D72829 Elevated white blood cell count, unspecified: Secondary | ICD-10-CM | POA: Diagnosis not present

## 2015-11-06 DIAGNOSIS — F17210 Nicotine dependence, cigarettes, uncomplicated: Secondary | ICD-10-CM

## 2015-11-06 DIAGNOSIS — K219 Gastro-esophageal reflux disease without esophagitis: Secondary | ICD-10-CM | POA: Diagnosis not present

## 2015-11-06 DIAGNOSIS — Z79899 Other long term (current) drug therapy: Secondary | ICD-10-CM

## 2015-11-06 NOTE — Patient Instructions (Signed)

## 2015-11-06 NOTE — Progress Notes (Signed)
[facility] NOTE  Patient Care Team: [name], NP as PCP - General (Nurse Practitioner)  CHIEF COMPLAINTS/PURPOSE OF CONSULTATION:   #2004 Leucocytosis/ mild 14-19k- likely secondary to smoking; bcr-abl-NEG; flowcytometry-NEG.    # ? Right lobe of liver lesion- 10.2 x 5.3x 5.7 cm echogenic lesion [US- Jan 2015; increase from 4.3cm from 2009]; worked at [employer]- neg per pt- No bx.] HISTORY OF PRESENTING ILLNESS:  [name] 40 y.o.  female long-standing history of smoking and history of leukocytosis is here to review the results of her blood work.  Patient unfortunately continues to smoke; however she is trying to cut down on smoking. No new fevers or chills or weight loss.   ROS: A complete 10 point review of system is done which is negative except mentioned above in history of present illness  MEDICAL HISTORY:  Past Medical History  Diagnosis Date  . Leukocytosis    reflux disease  SURGICAL HISTORY: Breast biopsy negative. Past Surgical History  Procedure Laterality Date  . Knee cartilage surgery Right 02/2015    SOCIAL HISTORY: Smokes pack of cigarettes a day; occasional alcohol. She works at [employer] History  . Marital Status: Married    Spouse Name: N/A  . Number of Children: N/A  . Years of Education: N/A   Occupational History  . Not on file.   Social History Main Topics  . Smoking status: Current Every Day Smoker -- 1.00 packs/day for 7 years    Types: Cigarettes  . Smokeless tobacco: Never Used  . Alcohol Use: No  . Drug Use: No  . Sexual Activity: Not on file   Other Topics Concern  . Not on file   Social History Narrative    FAMILY HISTORY: Family History  Problem Relation Age of Onset  . Breast cancer Maternal Grandmother     ALLERGIES:  has No Known Allergies.  MEDICATIONS:  Current Outpatient Prescriptions  Medication Sig Dispense Refill  . albuterol (PROVENTIL  HFA;VENTOLIN HFA) 108 (90 BASE) MCG/ACT inhaler Inhale 2 puffs into the lungs every 6 (six) hours as needed for wheezing or shortness of breath. 1 Inhaler 2  . benzonatate (TESSALON) 200 MG capsule Take 1 capsule (200 mg total) by mouth 3 (three) times daily as needed for cough. 20 capsule 0  . omeprazole (PRILOSEC) 20 MG capsule Take 20 mg by mouth daily.    . predniSONE (STERAPRED UNI-PAK 21 TAB) 10 MG (21) TBPK tablet Take 1 tablet (10 mg total) by mouth daily. Take one steroid taper pak as directed (Patient not taking: Reported on 11/06/2015) 21 tablet 0   No current facility-administered medications for this visit.      Marland Kitchen  PHYSICAL EXAMINATION:   Filed Vitals:   11/06/15 0945  BP: 151/81  Pulse: 56  Temp: 98.7 F (37.1 C)   Filed Weights   11/06/15 0945  Weight: 149 lb 9.3 oz (67.85 kg)    GENERAL: Well-nourished well-developed; Alert, no distress and she is alone.    LABORATORY DATA:  I have reviewed the data as listed Lab Results  Component Value Date   WBC 14.2* 10/16/2015   HGB 16.1* 10/16/2015   HCT 47.6* 10/16/2015   MCV 90.1 10/16/2015   PLT 494* 10/16/2015    Recent Labs  10/16/15 1422  NA 137  K 3.7  CL 99*  CO2 27  GLUCOSE 75  BUN 10  CREATININE 0.58  CALCIUM 9.7  GFRNONAA >  60  GFRAA >60  PROT 8.4*  ALBUMIN 4.9  AST 19  ALT 20  ALKPHOS 91  BILITOT 1.1    RADIOGRAPHIC STUDIES: I have personally reviewed the radiological images as listed and agreed with the findings in the report. No results found.  ASSESSMENT & PLAN:   # Mild Leukocytosis- predominant neutrophilia; likely secondary from smoking/intermittent infections. Neg flowcyometry/ neg Bcr-abl. Crp=1.9. Lab work shows white count is 14 predominant neutrophilia; hemoglobin 16 MCV 47 platelet count 494. I discussed about a bone marrow biopsy; however I think this is not necessary- unless her counts continue to get worse/abnormal.  # recommend follow-up in 6 months/CBC- we will check  jack 2 mutation. If negative- no further follow-ups would be recommended. Patient agrees with the plan.  # again recommended quitting smoking.   # 15  minutes face-to-face with the patient discussing the above plan of care; more than 50% of time spent on counseling and coordination of care.      Cammie Sickle, MD 11/06/2015 10:02 AM

## 2016-01-08 ENCOUNTER — Ambulatory Visit
Admission: EM | Admit: 2016-01-08 | Discharge: 2016-01-08 | Disposition: A | Discharge status: Home / Self Care | Payer: Medicaid Other | Attending: Family Medicine | Admitting: Family Medicine

## 2016-01-08 DIAGNOSIS — H9202 Otalgia, left ear: Secondary | ICD-10-CM

## 2016-01-08 DIAGNOSIS — H6982 Other specified disorders of Eustachian tube, left ear: Secondary | ICD-10-CM

## 2016-01-08 DIAGNOSIS — M6248 Contracture of muscle, other site: Secondary | ICD-10-CM | POA: Diagnosis not present

## 2016-01-08 DIAGNOSIS — M62838 Other muscle spasm: Secondary | ICD-10-CM

## 2016-01-08 MED ORDER — FEXOFENADINE-PSEUDOEPHED ER 180-240 MG PO TB24: 1.0000 | ORAL_TABLET | Freq: Every day | ORAL | Status: DC

## 2016-01-08 MED ORDER — PREDNISONE 10 MG (21) PO TBPK: ORAL_TABLET | ORAL | Status: DC

## 2016-01-08 MED ORDER — TIZANIDINE HCL 4 MG PO TABS: 4.0000 mg | ORAL_TABLET | Freq: Three times a day (TID) | ORAL | Status: DC | PRN

## 2016-01-08 MED ORDER — FLUTICASONE PROPIONATE 50 MCG/ACT NA SUSP: 2.0000 | Freq: Every day | NASAL | Status: DC

## 2016-01-08 MED ORDER — MELOXICAM 15 MG PO TABS: 15.0000 mg | ORAL_TABLET | Freq: Every day | ORAL | Status: DC

## 2016-01-08 NOTE — Discharge Instructions (Signed)
Earache An earache, also called otalgia, can be caused by many things. Pain from an earache can be sharp, dull, or burning. The pain may be temporary or constant. Earaches can be caused by problems with the ear, such as infection in either the middle ear or the ear canal, injury, impacted ear wax, middle ear pressure, or a foreign body in the ear. Ear pain can also result from problems in other areas. This is called referred pain. For example, pain can come from a sore throat, a tooth infection, or problems with the jaw or the joint between the jaw and the skull (temporomandibular joint, or TMJ). The cause of an earache is not always easy to identify. Watchful waiting may be appropriate for some earaches until a clear cause of the pain can be found. HOME CARE INSTRUCTIONS Watch your condition for any changes. The following actions may help to lessen any discomfort that you are feeling:  Take medicines only as directed by your health care provider. This includes ear drops.  Apply ice to your outer ear to help reduce pain.  Put ice in a plastic bag.  Place a towel between your skin and the bag.  Leave the ice on for 20 minutes, 2-3 times per day.  Do not put anything in your ear other than medicine that is prescribed by your health care provider.  Try resting in an upright position instead of lying down. This may help to reduce pressure in the middle ear and relieve pain.  Chew gum if it helps to relieve your ear pain.  Control any allergies that you have.  Keep all follow-up visits as directed by your health care provider. This is important. SEEK MEDICAL CARE IF:  Your pain does not improve within 2 days.  You have a fever.  You have new or worsening symptoms. SEEK IMMEDIATE MEDICAL CARE IF:  You have a severe headache.  You have a stiff neck.  You have difficulty swallowing.  You have redness or swelling behind your ear.  You have drainage from your ear.  You have hearing  loss.  You feel dizzy.   This information is not intended to replace advice given to you by your health care provider. Make sure you discuss any questions you have with your health care provider.   Document Released: 04/25/2004 Document Revised: 09/29/2014 Document Reviewed: 04/09/2014 Elsevier Interactive Patient Education 2016 Elsevier Inc. Muscle Cramps and Spasms Muscle cramps and spasms are when muscles tighten by themselves. They usually get better within minutes. Muscle cramps are painful. They are usually stronger and last longer than muscle spasms. Muscle spasms may or may not be painful. They can last a few seconds or much longer. HOME CARE  Drink enough fluid to keep your pee (urine) clear or pale yellow.  Massage, stretch, and relax the muscle.  Use a warm towel, heating pad, or warm shower water on tight muscles.  Place ice on the muscle if it is tender or in pain.  Put ice in a plastic bag.  Place a towel between your skin and the bag.  Leave the ice on for 15-20 minutes, 03-04 times a day.  Only take medicine as told by your doctor. GET HELP RIGHT AWAY IF:  Your cramps or spasms get worse, happen more often, or do not get better with time. MAKE SURE YOU:  Understand these instructions.  Will watch your condition.  Will get help right away if you are not doing well or get worse.  This information is not intended to replace advice given to you by your health care provider. Make sure you discuss any questions you have with your health care provider.   Document Released: 08/21/2008 Document Revised: 01/03/2013 Document Reviewed: 08/25/2012 Elsevier Interactive Patient Education 2016 [place] media is inflammation of your middle ear. This occurs when the auditory tube (eustachian tube) leading from the back of your nose (nasopharynx) to your eardrum is blocked. This blockage may result from a cold, environmental allergies, or an  upper respiratory infection. Unresolved barotitis media may lead to damage or hearing loss (barotrauma), which may become permanent. HOME CARE INSTRUCTIONS   Use medicines as recommended by your health care provider. Over-the-counter medicines will help unblock the canal and can help during times of air travel.  Do not put anything into your ears to clean or unplug them. Eardrops will not be helpful.  Do not swim, dive, or fly until your health care provider says it is all right to do so. If these activities are necessary, chewing gum with frequent, forceful swallowing may help. It is also helpful to hold your nose and gently blow to pop your ears for equalizing pressure changes. This forces air into the eustachian tube.  Only take over-the-counter or prescription medicines for pain, discomfort, or fever as directed by your health care provider.  A decongestant may be helpful in decongesting the middle ear and make pressure equalization easier. SEEK MEDICAL CARE IF:  You experience a serious form of dizziness in which you feel as if the room is spinning and you feel nauseated (vertigo).  Your symptoms only involve one ear. SEEK IMMEDIATE MEDICAL CARE IF:   You develop a severe headache, dizziness, or severe ear pain.  You have bloody or pus-like drainage from your ears.  You develop a fever.  Your problems do not improve or become worse. MAKE SURE YOU:   Understand these instructions.  Will watch your condition.  Will get help right away if you are not doing well or get worse.   This information is not intended to replace advice given to you by your health care provider. Make sure you discuss any questions you have with your health care provider.   Document Released: 09/05/2000 Document Revised: 06/29/2013 Document Reviewed: 04/05/2013 Elsevier Interactive Patient Education [employer].

## 2016-01-08 NOTE — ED Provider Notes (Signed)
CSN: [id number]     Arrival date & time 01/08/16  1155 History   First MD Initiated Contact with Patient 01/08/16 1332     Nurses notes were reviewed. Chief Complaint  Patient presents with  . Otalgia    Left ear pain and itching, popping. Generalized headache and neck feels sore. Pain 6/10   Patient is here because of pain in the left ear. The pain in the left ear started today she's had some generalized headaches and neck pain since for about 3 days. She states that the neck pain is a 6 out of 10 and is keeping up at night. She denies any fever.  She still smokes.She said trouble with her allergies. She has a history of leukocytosis. Maternal grandmother had breast cancer. No known drug allergies. (Consider location/radiation/quality/duration/timing/severity/associated sxs/prior Treatment) Patient is a 40 y.o. female presenting with ear pain and musculoskeletal pain. The history is provided by the patient. No language interpreter was used.  Otalgia Location:  Left Behind ear:  Swelling Quality:  Pressure and shooting Severity:  Moderate Onset quality:  Sudden Duration:  1 day Timing:  Constant Progression:  Waxing and waning Chronicity:  New Relieved by:  Nothing Worsened by:  Nothing tried Ineffective treatments:  None tried Associated symptoms: congestion and rhinorrhea   Associated symptoms: no abdominal pain, no fever, no headaches and no rash   Muscle Pain The current episode started more than 2 days ago. The problem occurs constantly. The problem has been gradually worsening. Pertinent negatives include no chest pain, no abdominal pain, no headaches and no shortness of breath. The symptoms are aggravated by twisting. Nothing relieves the symptoms. She has tried acetaminophen (Motrin) for the symptoms.    Past Medical History  Diagnosis Date  . Leukocytosis    Past Surgical History  Procedure Laterality Date  . Knee cartilage surgery Right 02/2015  . Laparoscopic tubal  ligation     Family History  Problem Relation Age of Onset  . Breast cancer Maternal Grandmother    Social History  Substance Use Topics  . Smoking status: Current Every Day Smoker -- 1.00 packs/day for 7 years    Types: Cigarettes  . Smokeless tobacco: Never Used  . Alcohol Use: No   OB History    No data available     Review of Systems  Constitutional: Negative for fever.  HENT: Positive for congestion, ear pain and rhinorrhea.   Respiratory: Negative for shortness of breath.   Cardiovascular: Negative for chest pain.  Gastrointestinal: Negative for abdominal pain.  Skin: Negative for rash.  Neurological: Negative for headaches.  All other systems reviewed and are negative.   Allergies  Review of patient's allergies indicates no known allergies.  Home Medications   Prior to Admission medications   Medication Sig Start Date End Date Taking? Authorizing Provider  albuterol (PROVENTIL HFA;VENTOLIN HFA) 108 (90 BASE) MCG/ACT inhaler Inhale 2 puffs into the lungs every 6 (six) hours as needed for wheezing or shortness of breath. 09/17/15   Earleen Newport, MD  benzonatate (TESSALON) 200 MG capsule Take 1 capsule (200 mg total) by mouth 3 (three) times daily as needed for cough. 09/17/15   Earleen Newport, MD  fexofenadine-pseudoephedrine (ALLEGRA-D ALLERGY & CONGESTION) 180-240 MG 24 hr tablet Take 1 tablet by mouth daily. 01/08/16   Frederich Cha, MD  fluticasone (FLONASE) 50 MCG/ACT nasal spray Place 2 sprays into both nostrils daily. 01/08/16   Frederich Cha, MD  meloxicam (MOBIC) 15 MG tablet Take  1 tablet (15 mg total) by mouth daily. 01/08/16   Frederich Cha, MD  omeprazole (PRILOSEC) 20 MG capsule Take 20 mg by mouth daily.    Historical Provider, MD  predniSONE (STERAPRED UNI-PAK 21 TAB) 10 MG (21) TBPK tablet Take 1 tablet (10 mg total) by mouth daily. Take one steroid taper pak as directed Patient not taking: Reported on 11/06/2015 09/17/15   Earleen Newport, MD    predniSONE (STERAPRED UNI-PAK 21 TAB) 10 MG (21) TBPK tablet Sig 6 tablet day 1, 5 tablets day 2, 4 tablets day 3,,3tablets day 4, 2 tablets day 5, 1 tablet day 6 take all tablets orally 01/08/16   Frederich Cha, MD  tiZANidine (ZANAFLEX) 4 MG tablet Take 1 tablet (4 mg total) by mouth every 8 (eight) hours as needed for muscle spasms. May cause sedation 01/08/16   Frederich Cha, MD   Meds Ordered and Administered this Visit  Medications - No data to display  BP 129/84 mmHg  Pulse 87  Temp(Src) 98.3 F (36.8 C) (Oral)  Resp 18  Ht 5\' 2"  (1.575 m)  Wt 155 lb (70.308 kg)  BMI 28.34 kg/m2  SpO2 100%  LMP 12/22/2015 No data found.   Physical Exam  Constitutional: She is oriented to person, place, and time. She appears well-developed and well-nourished.  HENT:  Head: Normocephalic and atraumatic.  Right Ear: Hearing, tympanic membrane, external ear and ear canal normal.  Left Ear: Hearing, tympanic membrane and external ear normal. Tympanic membrane is not erythematous and not bulging.  Nose: Mucosal edema and sinus tenderness present. Right sinus exhibits maxillary sinus tenderness. Left sinus exhibits maxillary sinus tenderness.  Mouth/Throat: Uvula is midline. Posterior oropharyngeal erythema present.  She had pain with either evaluated the left ear. The ear speculum hurt the outside of the left ear and ear canal but no signs of any lesions or abnormalities noted. She did have pain going down the left area of her neck by the outer ear.  Eyes: Conjunctivae are normal. Pupils are equal, round, and reactive to light.  Neck: Neck supple. Muscular tenderness present.    She has some tenderness over the right pedis muscle but most the pain is overwhelming the left along both the neck belly and the shoulder belly of the trapezius muscle.  Musculoskeletal: Normal range of motion. She exhibits tenderness.  Lymphadenopathy:    She has cervical adenopathy.  Neurological: She is alert and oriented  to person, place, and time.  Skin: Skin is warm.  Psychiatric: She has a normal mood and affect.  Vitals reviewed.   ED Course  Procedures (including critical care time)  Labs Review Labs Reviewed - No data to display  Imaging Review No results found.   Visual Acuity Review  Right Eye Distance:   Left Eye Distance:   Bilateral Distance:    Right Eye Near:   Left Eye Near:    Bilateral Near:         MDM   1. Eustachian tube dysfunction, left   2. Muscle spasms of head or neck   3. Otalgia of left ear    Services no signs of ear infection and ear has only been bothering her for 1 day today we'll place her on Sterapred dose pack for 6 days and Flonase nasal spray 2 puffs each nostril and Allegra-D. This hopefully will improve the eustachian tube dysfunction. For the muscle spasms of the neck will place on Mobic 15 mg she also headache and because she  is on Medicaid will use Zanaflex 4 mg 1 tablet 3 times a day. Follow-up with PCP if not better in a week work note given to her and per her request to her husband who drove her here today as well.    Note: This dictation was prepared with Dragon dictation along with smaller phrase technology. Any transcriptional errors that result from this process are unintentional.    Frederich Cha, MD 01/08/16 1422

## 2016-01-15 ENCOUNTER — Emergency Department
Admission: EM | Admit: 2016-01-15 | Discharge: 2016-01-16 | Disposition: A | Discharge status: Home / Self Care | Payer: Medicaid Other | Attending: Emergency Medicine | Admitting: Emergency Medicine

## 2016-01-15 ENCOUNTER — Encounter: Payer: Self-pay | Admitting: Emergency Medicine

## 2016-01-15 DIAGNOSIS — K625 Hemorrhage of anus and rectum: Secondary | ICD-10-CM | POA: Diagnosis present

## 2016-01-15 DIAGNOSIS — M545 Low back pain: Secondary | ICD-10-CM | POA: Diagnosis not present

## 2016-01-15 DIAGNOSIS — F17210 Nicotine dependence, cigarettes, uncomplicated: Secondary | ICD-10-CM | POA: Insufficient documentation

## 2016-01-15 DIAGNOSIS — R1030 Lower abdominal pain, unspecified: Secondary | ICD-10-CM | POA: Diagnosis not present

## 2016-01-15 LAB — COMPREHENSIVE METABOLIC PANEL
ALT: 31 U/L (ref 14–54)
ANION GAP: 7 (ref 5–15)
AST: 22 U/L (ref 15–41)
Albumin: 4.5 g/dL (ref 3.5–5.0)
Alkaline Phosphatase: 68 U/L (ref 38–126)
BUN: 13 mg/dL (ref 6–20)
CHLORIDE: 100 mmol/L — AB (ref 101–111)
CO2: 28 mmol/L (ref 22–32)
CREATININE: 0.66 mg/dL (ref 0.44–1.00)
Calcium: 9.5 mg/dL (ref 8.9–10.3)
Glucose, Bld: 95 mg/dL (ref 65–99)
POTASSIUM: 3.9 mmol/L (ref 3.5–5.1)
SODIUM: 135 mmol/L (ref 135–145)
Total Bilirubin: 1.1 mg/dL (ref 0.3–1.2)
Total Protein: 7.6 g/dL (ref 6.5–8.1)

## 2016-01-15 LAB — URINALYSIS COMPLETE WITH MICROSCOPIC (ARMC ONLY)
BACTERIA UA: NONE SEEN
BILIRUBIN URINE: NEGATIVE
GLUCOSE, UA: NEGATIVE mg/dL
KETONES UR: NEGATIVE mg/dL
Leukocytes, UA: NEGATIVE
NITRITE: NEGATIVE
Protein, ur: NEGATIVE mg/dL
RBC / HPF: NONE SEEN RBC/hpf (ref 0–5)
SPECIFIC GRAVITY, URINE: 1.014 (ref 1.005–1.030)
pH: 5 (ref 5.0–8.0)

## 2016-01-15 LAB — CBC
HCT: 45.2 % (ref 35.0–47.0)
HEMOGLOBIN: 15.5 g/dL (ref 12.0–16.0)
MCH: 30.5 pg (ref 26.0–34.0)
MCHC: 34.3 g/dL (ref 32.0–36.0)
MCV: 88.9 fL (ref 80.0–100.0)
Platelets: 440 10*3/uL (ref 150–440)
RBC: 5.09 MIL/uL (ref 3.80–5.20)
RDW: 13.9 % (ref 11.5–14.5)
WBC: 22.7 10*3/uL — AB (ref 3.6–11.0)

## 2016-01-15 NOTE — ED Notes (Signed)
Patient ambulatory to triage with steady gait, without difficulty or distress noted; pt reports lower back pain, nausea and blood in stools x 2 days; denies abd pain; denies hx of same

## 2016-01-15 NOTE — ED Notes (Signed)
Pt states she had 2 BM's today and noticed blood in her stool. Pt reports she has constipation issues but never needs to take medication to help relieve the issue. Pt denies abdominal pain, but reports 8/10 lower back pain.

## 2016-01-15 NOTE — ED Provider Notes (Signed)
[facility] Emergency Department Provider Note  ____________________________________________  Time seen: Approximately 11:46 PM  I have reviewed the triage vital signs and the nursing notes.   HISTORY  Chief Complaint Back Pain and Rectal Bleeding    HPI [name] is a 40 y.o. female with no significant past medical history who presents for gradual onset of lower back pain, lower abdominal pain, and several episodes of blood in her stool.  She reports that she awoke this morning with the discomfort which is actually gotten slightly better over the course the day but it is made worse by any movement or straining.  She denies nausea and vomiting as well as diarrhea.  She does have chronic constipation and takes medicine to help with it.  She does not have any history of back pain and currently reports her lower back pain is severe although she is in no acute distress at this time.  She has never had blood in her stool previously and states that the first time she had a bowel movement there is a small amount of blood that she did not think much of.  The second time there was more blood is seen to be mixed in with the stool and possibly small clots.  She wanted to make sure it was nothing serious so she came for evaluation.  She denies fever/chills, chest pain, shortness of breath, dysuria, hematuria.  She has had a tubal ligation.   Past Medical History  Diagnosis Date  . Leukocytosis     There are no active problems to display for this patient.   Past Surgical History  Procedure Laterality Date  . Knee cartilage surgery Right 02/2015  . Laparoscopic tubal ligation      No current outpatient prescriptions on file.  Allergies Review of patient's allergies indicates no known allergies.  Family History  Problem Relation Age of Onset  . Breast cancer Maternal Grandmother     Social History Social History  Substance Use Topics  . Smoking  status: Current Every Day Smoker -- 1.00 packs/day for 7 years    Types: Cigarettes  . Smokeless tobacco: Never Used  . Alcohol Use: No    Review of Systems Constitutional: No fever/chills Eyes: No visual changes. ENT: No sore throat. Cardiovascular: Denies chest pain. Respiratory: Denies shortness of breath. Gastrointestinal: +abd pain, +lower back pain.  Denies N/V/D.  +BRBPR Genitourinary: Negative for dysuria. Musculoskeletal: Negative for back pain. Skin: Negative for rash. Neurological: Negative for headaches, focal weakness or numbness.  10-point ROS otherwise negative.  ____________________________________________   PHYSICAL EXAM:  VITAL SIGNS: ED Triage Vitals  Enc Vitals Group     BP 01/15/16 2049 141/88 mmHg     Pulse Rate 01/15/16 2049 94     Resp 01/15/16 2049 18     Temp 01/15/16 2049 98.4 F (36.9 C)     Temp Source 01/15/16 2049 Oral     SpO2 01/15/16 2049 98 %     Weight 01/15/16 2049 150 lb (68.04 kg)     Height 01/15/16 2049 5\' 2"  (1.575 m)     Head Cir --      Peak Flow --      Pain Score 01/15/16 2052 6     Pain Loc --      Pain Edu? --      Excl. in [place]? --     Constitutional: Alert and oriented. Well appearing and in no acute distress. Eyes: Conjunctivae are normal.  PERRL. EOMI. Head: Atraumatic. Nose: No congestion/rhinnorhea. Mouth/Throat: Mucous membranes are moist.  Oropharynx non-erythematous. Neck: No stridor.  No meningeal signs.   Cardiovascular: Normal rate, regular rhythm. Good peripheral circulation. Grossly normal heart sounds. Respiratory: Normal respiratory effort.  No retractions. Lungs CTAB. Gastrointestinal: Soft with very mild lower abd tenderness to palpation. Rectal:  Multiple nonthrombosed external hemorrhoids.  Nontender exam.  No significant amount of stool and no gross blood in vault.  Hemoccult negative with quality control passed. Genitourinary: deferred Musculoskeletal: No lower extremity tenderness nor edema. No  gross deformities of extremities. No CVA tenderness Neurologic:  Normal speech and language. No gross focal neurologic deficits are appreciated.  Skin:  Skin is warm, dry and intact. No rash noted. Psychiatric: Mood and affect are normal. Speech and behavior are normal.  ____________________________________________   LABS (all labs ordered are listed, but only abnormal results are displayed)  Labs Reviewed  COMPREHENSIVE METABOLIC PANEL - Abnormal; Notable for the following:    Chloride 100 (*)    All other components within normal limits  CBC - Abnormal; Notable for the following:    WBC 22.7 (*)    All other components within normal limits  URINALYSIS COMPLETEWITH MICROSCOPIC ([facility] ONLY) - Abnormal; Notable for the following:    Color, Urine YELLOW (*)    APPearance CLEAR (*)    Hgb urine dipstick 1+ (*)    Squamous Epithelial / LPF 0-5 (*)    All other components within normal limits   ____________________________________________  EKG  None ____________________________________________  RADIOLOGY   Ct Abdomen Pelvis W Contrast  01/16/2016  CLINICAL DATA:  Acute onset of blood in stool. Lower back pain. Initial encounter. EXAM: CT ABDOMEN AND PELVIS WITH CONTRAST TECHNIQUE: Multidetector CT imaging of the abdomen and pelvis was performed using the standard protocol following bolus administration of intravenous contrast. CONTRAST:  142mL ISOVUE-300 IOPAMIDOL (ISOVUE-300) INJECTION 61% COMPARISON:  CT of the abdomen and pelvis performed 07/12/2008 FINDINGS: The visualized lung bases are clear. There appears to be a large focus of focal nodular hyperplasia at the right hepatic lobe, measuring approximately 7.1 cm. The known smaller focus of focal nodular hyperplasia within the right hepatic lobe is less well seen on this study. The spleen is unremarkable in appearance. The gallbladder is within normal limits. The pancreas and adrenal glands are unremarkable. The kidneys are  unremarkable in appearance. There is no evidence of hydronephrosis. No renal or ureteral stones are seen. No perinephric stranding is appreciated. No free fluid is identified. The small bowel is unremarkable in appearance. The stomach is within normal limits. No acute vascular abnormalities are seen. The appendix is normal in caliber, without evidence of appendicitis. The colon is grossly unremarkable in appearance. The bladder is mildly distended and grossly unremarkable. A 2.4 cm focus of fluid at the uterine fundus may reflect a degenerating fibroid, though pelvic ultrasound could be considered for further evaluation on an elective nonemergent basis. The ovaries are grossly symmetric. No suspicious adnexal masses are seen. No inguinal lymphadenopathy is seen. No acute osseous abnormalities are identified. IMPRESSION: 1. No acute abnormality seen to explain the patient's symptoms. 2. 2.4 cm focus of fluid at the uterine fundus may reflect a degenerating fibroid, though pelvic ultrasound could be considered for further evaluation on an elective nonemergent basis. 3. 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 This has increased only minimally in size from 2015. Electronically Signed   By: Francoise Schaumann.D.  On: 01/16/2016 01:25    ____________________________________________   PROCEDURES  Procedure(s) performed: None  Critical Care performed: No ____________________________________________   INITIAL IMPRESSION / ASSESSMENT AND PLAN / ED COURSE  Pertinent labs & imaging results that were available during my care of the patient were reviewed by me and considered in my medical decision making (see chart for details).  Reassuring exam and workup.  Nonspecific leukocytosis without obvious cause.  Patient told me to expect the liver hyperplasia seen on CT.  Told her verbally and in written discharge instructions about the probable degenerating fibroid and  need for outpatient follow up and ultrasound.  Patient is happy, NAD, and wants to go home.    I gave my usual and customary return precautions.     ____________________________________________  FINAL CLINICAL IMPRESSION(S) / ED DIAGNOSES  Final diagnoses:  Lower abdominal pain  Low back pain without sciatica, unspecified back pain laterality      NEW MEDICATIONS STARTED DURING THIS VISIT:  New Prescriptions   No medications on file      Note:  This document was prepared using Dragon voice recognition software and may include unintentional dictation errors.   Hinda Kehr, MD 01/16/16 [phone]

## 2016-01-16 ENCOUNTER — Emergency Department: Payer: Medicaid Other

## 2016-01-16 MED ORDER — IOPAMIDOL (ISOVUE-300) INJECTION 61%
100.0000 mL | Freq: Once | INTRAVENOUS | Status: AC | PRN
  Administered 2016-01-16: 100 mL via INTRAVENOUS

## 2016-01-16 MED ORDER — DIATRIZOATE MEGLUMINE & SODIUM 66-10 % PO SOLN
15.0000 mL | Freq: Once | ORAL | Status: AC
  Administered 2016-01-16: 15 mL via ORAL
  Filled 2016-01-16: qty 30

## 2016-01-16 NOTE — Discharge Instructions (Signed)
As we discussed, although your white blood cell count was elevated today, we found no obvious source of infection.  Your CT scan was reassuring; besides your known liver hyperplasia, it looks like you may have a degenerating fibroid in your uterus.  This is not dangerous nor an emergent medical condition, and the radiologist recommended that you follow up with your primary care doctor to schedule an outpatient transvaginal ultrasound.  Please return immediately to the Emergency Department if you develop any new or worsening symptoms that concern you.   Abdominal Pain, Adult Many things can cause abdominal pain. Usually, abdominal pain is not caused by a disease and will improve without treatment. It can often be observed and treated at home. Your health care provider will do a physical exam and possibly order blood tests and X-rays to help determine the seriousness of your pain. However, in many cases, more time must pass before a clear cause of the pain can be found. Before that point, your health care provider may not know if you need more testing or further treatment. HOME CARE INSTRUCTIONS Monitor your abdominal pain for any changes. The following actions may help to alleviate any discomfort you are experiencing:  Only take over-the-counter or prescription medicines as directed by your health care provider.  Do not take laxatives unless directed to do so by your health care provider.  Try a clear liquid diet (broth, tea, or water) as directed by your health care provider. Slowly move to a bland diet as tolerated. SEEK MEDICAL CARE IF:  You have unexplained abdominal pain.  You have abdominal pain associated with nausea or diarrhea.  You have pain when you urinate or have a bowel movement.  You experience abdominal pain that wakes you in the night.  You have abdominal pain that is worsened or improved by eating food.  You have abdominal pain that is worsened with eating fatty  foods.  You have a fever. SEEK IMMEDIATE MEDICAL CARE IF:  Your pain does not go away within 2 hours.  You keep throwing up (vomiting).  Your pain is felt only in portions of the abdomen, such as the right side or the left lower portion of the abdomen.  You pass bloody or black tarry stools. MAKE SURE YOU:  Understand these instructions.  Will watch your condition.  Will get help right away if you are not doing well or get worse.   This information is not intended to replace advice given to you by your health care provider. Make sure you discuss any questions you have with your health care provider.   Document Released: 06/18/2005 Document Revised: 05/30/2015 Document Reviewed: 05/18/2013 Elsevier Interactive Patient Education 2016 Else[employer] Inc[employer]  Back Pain, Adult Back pain is very common in adults.The cause of back pain is rarely dangerous and the pain often gets better over time.The cause of your back pain may not be known. Some common causes of back pain include:  Strain of the muscles or ligaments supporting the spine.  Wear and tear (degeneration) of the spinal disks.  Arthritis.  Direct injury to the back. For many people, back pain may return. Since back pain is rarely dangerous, most people can learn to manage this condition on their own. HOME CARE INSTRUCTIONS Watch your back pain for any changes. The following actions may help to lessen any discomfort you are feeling:  Remain active. It is stressful on your back to sit or stand in one place for long periods of time. Do  not sit, drive, or stand in one place for more than 30 minutes at a time. Take short walks on even surfaces as soon as you are able.Try to increase the length of time you walk each day.  Exercise regularly as directed by your health care provider. Exercise helps your back heal faster. It also helps avoid future injury by keeping your muscles strong and flexible.  Do not stay in bed.Resting more  than 1-2 days can delay your recovery.  Pay attention to your body when you bend and lift. The most comfortable positions are those that put less stress on your recovering back. Always use proper lifting techniques, including:  Bending your knees.  Keeping the load close to your body.  Avoiding twisting.  Find a comfortable position to sleep. Use a firm mattress and lie on your side with your knees slightly bent. If you lie on your back, put a pillow under your knees.  Avoid feeling anxious or stressed.Stress increases muscle tension and can worsen back pain.It is important to recognize when you are anxious or stressed and learn ways to manage it, such as with exercise.  Take medicines only as directed by your health care provider. Over-the-counter medicines to reduce pain and inflammation are often the most helpful.Your health care provider may prescribe muscle relaxant drugs.These medicines help dull your pain so you can more quickly return to your normal activities and healthy exercise.  Apply ice to the injured area:  Put ice in a plastic bag.  Place a towel between your skin and the bag.  Leave the ice on for 20 minutes, 2-3 times a day for the first 2-3 days. After that, ice and heat may be alternated to reduce pain and spasms.  Maintain a healthy weight. Excess weight puts extra stress on your back and makes it difficult to maintain good posture. SEEK MEDICAL CARE IF:  You have pain that is not relieved with rest or medicine.  You have increasing pain going down into the legs or buttocks.  You have pain that does not improve in one week.  You have night pain.  You lose weight.  You have a fever or chills. SEEK IMMEDIATE MEDICAL CARE IF:   You develop new bowel or bladder control problems.  You have unusual weakness or numbness in your arms or legs.  You develop nausea or vomiting.  You develop abdominal pain.  You feel faint.   This information is not  intended to replace advice given to you by your health care provider. Make sure you discuss any questions you have with your health care provider.   Document Released: 09/08/2005 Document Revised: 09/29/2014 Document Reviewed: 01/10/2014 Elsevier Interactive Patient Education [employer].

## 2016-01-16 NOTE — ED Notes (Signed)
Patient transported to CT 

## 2016-02-19 ENCOUNTER — Inpatient Hospital Stay: Payer: Medicaid Other | Admitting: Internal Medicine

## 2016-02-19 ENCOUNTER — Encounter: Payer: Self-pay | Admitting: *Deleted

## 2016-02-19 NOTE — Progress Notes (Signed)
No show with apt with Dr. Rogue Bussing today.

## 2016-02-22 ENCOUNTER — Other Ambulatory Visit: Payer: Self-pay | Admitting: Primary Care

## 2016-02-22 DIAGNOSIS — N63 Unspecified lump in unspecified breast: Secondary | ICD-10-CM

## 2016-02-26 ENCOUNTER — Ambulatory Visit: Payer: Medicaid Other | Admitting: Internal Medicine

## 2016-03-04 ENCOUNTER — Encounter: Payer: Self-pay | Admitting: *Deleted

## 2016-03-04 ENCOUNTER — Inpatient Hospital Stay: Payer: Medicaid Other | Attending: Internal Medicine | Admitting: Internal Medicine

## 2016-03-04 VITALS — BP 110/76 | HR 120 | Temp 98.9°F | Resp 18 | Wt 140.7 lb

## 2016-03-04 DIAGNOSIS — F17210 Nicotine dependence, cigarettes, uncomplicated: Secondary | ICD-10-CM

## 2016-03-04 DIAGNOSIS — D72829 Elevated white blood cell count, unspecified: Secondary | ICD-10-CM

## 2016-03-04 DIAGNOSIS — Z79899 Other long term (current) drug therapy: Secondary | ICD-10-CM

## 2016-03-04 DIAGNOSIS — K219 Gastro-esophageal reflux disease without esophagitis: Secondary | ICD-10-CM | POA: Insufficient documentation

## 2016-03-04 NOTE — Progress Notes (Signed)
[facility] NOTE  Patient Care Team: [name], NP as PCP - General (Nurse Practitioner)  CHIEF COMPLAINTS/PURPOSE OF CONSULTATION:   #2004 Leucocytosis/ mild 14-19k- likely secondary to smoking; bcr-abl-NEG; flowcytometry-NEG.    # ? Right lobe of liver lesion- 10.2 x 5.3x 5.7 cm echogenic lesion [US- Jan 2015; increase from 4.3cm from 2009]; worked at [employer]- neg per pt- No bx.]; April 2017 CT scan negative for any suspicion for malignancy.  HISTORY OF PRESENTING ILLNESS:  [name] 40 y.o.  female long-standing history of smoking and history of leukocytosis- likely secondary to reactive smoking- recently had blood work through PCP which was routine. Patient found to have elevated white count up to 22,000; normal hemoglobin and platelets. She has been referred back to [place] for further recommendation/evaluation.  No new fevers or chills or weight loss.  Patient denies any unusual night sweats or lumps or bumps.   ROS: A complete 10 point review of system is done which is negative except mentioned above in history of present illness  MEDICAL HISTORY:  Past Medical History  Diagnosis Date  . Leukocytosis    reflux disease  SURGICAL HISTORY: Breast biopsy negative. Past Surgical History  Procedure Laterality Date  . Knee cartilage surgery Right 02/2015  . Laparoscopic tubal ligation      SOCIAL HISTORY: Smokes pack of cigarettes a day; occasional alcohol. She works at [employer] History  . Marital Status: Married    Spouse Name: N/A  . Number of Children: N/A  . Years of Education: N/A   Occupational History  . Not on file.   Social History Main Topics  . Smoking status: Current Every Day Smoker -- 1.00 packs/day for 7 years    Types: Cigarettes  . Smokeless tobacco: Never Used  . Alcohol Use: No  . Drug Use: No  . Sexual Activity: Not on file   Other Topics Concern  . Not on file   Social History  Narrative    FAMILY HISTORY: Family History  Problem Relation Age of Onset  . Breast cancer Maternal Grandmother     ALLERGIES:  has No Known Allergies.  MEDICATIONS:  No current outpatient prescriptions on file.   No current facility-administered medications for this visit.      Marland Kitchen  PHYSICAL EXAMINATION:   Filed Vitals:   03/04/16 1504  BP: 110/76  Pulse: 120  Temp: 98.9 F (37.2 C)  Resp: 18   Filed Weights   03/04/16 1504  Weight: 140 lb 10.5 oz (63.8 kg)    GENERAL: Well-nourished well-developed; Alert, no distress and she is alone.    LABORATORY DATA:  I have reviewed the data as listed Lab Results  Component Value Date   WBC 22.7* 01/15/2016   HGB 15.5 01/15/2016   HCT 45.2 01/15/2016   MCV 88.9 01/15/2016   PLT 440 01/15/2016    Recent Labs  10/16/15 1422 01/15/16 2055  NA 137 135  K 3.7 3.9  CL 99* 100*  CO2 27 28  GLUCOSE 75 95  BUN 10 13  CREATININE 0.58 0.66  CALCIUM 9.7 9.5  GFRNONAA >60 >60  GFRAA >60 >60  PROT 8.4* 7.6  ALBUMIN 4.9 4.5  AST 19 22  ALT 20 31  ALKPHOS 91 68  BILITOT 1.1 1.1    RADIOGRAPHIC STUDIES: I have personally reviewed the radiological images as listed and agreed with the findings in the report. No results found.  ASSESSMENT & PLAN:   # Mild Leukocytosis- predominant neutrophilia; Unclear etiology- BCR ABL negative; negative on flow cytometry. Given the recent trend 22 thousand; recommend further evaluation with a bone marrow biopsy. If suggestive of myeloproliferative neoplasm- recommend jack 2 testing. I reviewed the bone marrow biopsy procedure/complications in detail. Patient is agreeable. Sh'll have it ordered through interventional radiology. Patient interested in having under anesthesia.  # 15  minutes face-to-face with the patient discussing the above plan of care; more than 50% of time spent on counseling and coordination of care.     Cammie Sickle, MD 03/04/2016 5:59 PM

## 2016-03-04 NOTE — Progress Notes (Signed)
Patient here for follow up, patient will need work note that she was here today.

## 2016-03-06 ENCOUNTER — Ambulatory Visit
Admission: RE | Admit: 2016-03-06 | Discharge: 2016-03-06 | Disposition: A | Discharge status: Home / Self Care | Payer: Medicaid Other | Source: Ambulatory Visit | Attending: Primary Care | Admitting: Primary Care

## 2016-03-06 DIAGNOSIS — N63 Unspecified lump in unspecified breast: Secondary | ICD-10-CM

## 2016-03-10 ENCOUNTER — Ambulatory Visit: Admission: RE | Admit: 2016-03-10 | Payer: Medicaid Other | Source: Ambulatory Visit

## 2016-03-11 ENCOUNTER — Ambulatory Visit: Payer: Medicaid Other | Admitting: Internal Medicine

## 2016-03-18 ENCOUNTER — Ambulatory Visit: Payer: Medicaid Other | Admitting: Internal Medicine

## 2016-03-19 ENCOUNTER — Encounter: Payer: Self-pay | Admitting: Emergency Medicine

## 2016-03-19 ENCOUNTER — Emergency Department
Admission: EM | Admit: 2016-03-19 | Discharge: 2016-03-19 | Disposition: A | Discharge status: Home / Self Care | Payer: Medicaid Other | Attending: Emergency Medicine | Admitting: Emergency Medicine

## 2016-03-19 DIAGNOSIS — G5601 Carpal tunnel syndrome, right upper limb: Secondary | ICD-10-CM | POA: Insufficient documentation

## 2016-03-19 DIAGNOSIS — M25531 Pain in right wrist: Secondary | ICD-10-CM

## 2016-03-19 DIAGNOSIS — F17210 Nicotine dependence, cigarettes, uncomplicated: Secondary | ICD-10-CM | POA: Insufficient documentation

## 2016-03-19 MED ORDER — HYDROCODONE-ACETAMINOPHEN 5-325 MG PO TABS: 1.0000 | ORAL_TABLET | Freq: Four times a day (QID) | ORAL | Status: DC | PRN

## 2016-03-19 MED ORDER — HYDROCODONE-ACETAMINOPHEN 5-325 MG PO TABS
1.0000 | ORAL_TABLET | Freq: Once | ORAL | Status: AC
  Administered 2016-03-19: 1 via ORAL
  Filled 2016-03-19: qty 1

## 2016-03-19 MED ORDER — IBUPROFEN 600 MG PO TABS
600.0000 mg | ORAL_TABLET | Freq: Once | ORAL | Status: AC
  Administered 2016-03-19: 600 mg via ORAL
  Filled 2016-03-19: qty 1

## 2016-03-19 MED ORDER — IBUPROFEN 600 MG PO TABS: 600.0000 mg | ORAL_TABLET | Freq: Three times a day (TID) | ORAL | Status: DC | PRN

## 2016-03-19 NOTE — Discharge Instructions (Signed)
1. You may take pain medicines as needed (Motrin/Norco #15). 2. Apply ice to affected area several times daily. 3. You may remove wrist splint to bathe or sleep. 4. Return to the ER for worsening symptoms, extremity weakness, persistent vomiting, difficulty breathing or other concerns.  Carpal Tunnel Syndrome Carpal tunnel syndrome is a condition that causes pain in your hand and arm. The carpal tunnel is a narrow area that is on the palm side of your wrist. Repeated wrist motion or certain diseases may cause swelling in the tunnel. This swelling can pinch the main nerve in the wrist (median nerve).  HOME CARE If You Have a Splint:  Wear it as told by your doctor. Remove it only as told by your doctor.  Loosen the splint if your fingers:  Become numb and tingle.  Turn blue and cold.  Keep the splint clean and dry. General Instructions  Take over-the-counter and prescription medicines only as told by your doctor.  Rest your wrist from any activity that may be causing your pain. If needed, talk to your employer about changes that can be made in your work, such as getting a wrist pad to use while typing.  If directed, apply ice to the painful area:  Put ice in a plastic bag.  Place a towel between your skin and the bag.  Leave the ice on for 20 minutes, 2-3 times per day.  Keep all follow-up visits as told by your doctor. This is important.  Do any exercises as told by your doctor, physical therapist, or occupational therapist. GET HELP IF:  You have new symptoms.  Medicine does not help your pain.  Your symptoms get worse.   This information is not intended to replace advice given to you by your health care provider. Make sure you discuss any questions you have with your health care provider.   Document Released: 08/28/2011 Document Revised: 05/30/2015 Document Reviewed: 01/24/2015 Elsevier Interactive Patient Education 2016 Elsevier Inc.  Cryotherapy Cryotherapy is  when you put ice on your injury. Ice helps lessen pain and puffiness (swelling) after an injury. Ice works the best when you start using it in the first 24 to 48 hours after an injury. HOME CARE  Put a dry or damp towel between the ice pack and your skin.  You may press gently on the ice pack.  Leave the ice on for no more than 10 to 20 minutes at a time.  Check your skin after 5 minutes to make sure your skin is okay.  Rest at least 20 minutes between ice pack uses.  Stop using ice when your skin loses feeling (numbness).  Do not use ice on someone who cannot tell you when it hurts. This includes small children and people with memory problems (dementia). GET HELP RIGHT AWAY IF:  You have white spots on your skin.  Your skin turns blue or pale.  Your skin feels waxy or hard.  Your puffiness gets worse. MAKE SURE YOU:   Understand these instructions.  Will watch your condition.  Will get help right away if you are not doing well or get worse.   This information is not intended to replace advice given to you by your health care provider. Make sure you discuss any questions you have with your health care provider.   Document Released: 02/25/2008 Document Revised: 12/01/2011 Document Reviewed: 05/01/2011 Elsevier Interactive Patient Education 2016 Elsevier Inc.  Wrist Pain There are many things that can cause wrist pain. Some  common causes include:  An injury to the wrist area.  Overuse of the joint.  A condition that causes too much pressure to be put on a nerve in the wrist (carpal tunnel syndrome).  Wear and tear of the joints that happens as a person gets older (osteoarthritis).  Other types of arthritis. Sometimes, the cause is not known. The pain often goes away when you follow instructions from your doctor about relieving pain at home. If your wrist pain does not go away, tests may need to be done to find the cause. HOME CARE Pay attention to any changes in  your symptoms. Take these actions to help with your pain:  Rest your wrist for at least 48 hours or as told by your doctor.  If your doctor tells you to, put ice on the injured area:  Put ice in a plastic bag.  Place a towel between your skin and the bag.  Leave the ice on for 20 minutes, 2-3 times per day.  Keep your arm raised (elevated) above the level of your heart while you are sitting or lying down.  If a splint or elastic bandage has been put on the injured area:  Wear it as told by your doctor.  Take the splint or bandage off only as told by your doctor.  Loosen the splint or bandage if your fingers lose feeling (are numb) or have a tingling feeling, or if they turn cold or blue.  Take over-the-counter and prescription medicines only as told by your doctor.  Keep all follow-up visits as told by your doctor. This is important. GET HELP IF:  Your pain is not helped by treatment.  Your pain gets worse. GET HELP RIGHT AWAY IF:   Your fingers swell.  Your fingers turn white, very red, or cold and blue.  Your fingers lose feeling or have a tingling feeling.  You have trouble moving your fingers.   This information is not intended to replace advice given to you by your health care provider. Make sure you discuss any questions you have with your health care provider.   Document Released: 02/25/2008 Document Revised: 05/30/2015 Document Reviewed: 01/24/2015 Elsevier Interactive Patient Education [employer].

## 2016-03-19 NOTE — ED Provider Notes (Signed)
[facility] Emergency Department Provider Note   ____________________________________________  Time seen: Approximately 6:35 AM  I have reviewed the triage vital signs and the nursing notes.   HISTORY  Chief Complaint Hand Pain and Numbness    HPI [name] is a 40 y.o. female presents to the ED from home with a chief complaint of right wrist pain and hand numbness. No symptoms times several weeks, worse in the past several days. Patient is right-hand dominant. 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 Complains of numbness mostly in the thumb, index and third fingers of her right hand. This morning she awoke with numbness in all fingers that radiated up to her elbow which she describes as "pins and needles sensation". States it took her 20 minutes of shaking her arm to resolve the sensation. Also notes swelling to her right wrist. Denies recent trauma or injury. Denies associated fever, chills, chest pain, shortness of breath, abdominal pain, nausea, vomiting, diarrhea. Nothing makes her symptoms better. Movement makes her symptoms worse.   Past Medical History  Diagnosis Date  . Leukocytosis     Patient Active Problem List   Diagnosis Date Noted  . Leucocytosis 03/04/2016    Past Surgical History  Procedure Laterality Date  . Knee cartilage surgery Right 02/2015  . Laparoscopic tubal ligation    . Breast biopsy Right 2016    [place] core    Current Outpatient Rx  Name  Route  Sig  Dispense  Refill  . HYDROcodone-acetaminophen (NORCO) 5-325 MG tablet   Oral   Take 1 tablet by mouth every 6 (six) hours as needed for moderate pain.   15 tablet   0   . ibuprofen (ADVIL,MOTRIN) 600 MG tablet   Oral   Take 1 tablet (600 mg total) by mouth every 8 (eight) hours as needed.   15 tablet   0     Allergies Review of patient's allergies indicates no known allergies.  Family History  Problem Relation Age of Onset   . Breast cancer Maternal Grandmother 82    Social History Social History  Substance Use Topics  . Smoking status: Current Every Day Smoker -- 1.00 packs/day for 7 years    Types: Cigarettes  . Smokeless tobacco: Never Used  . Alcohol Use: No    Review of Systems  Constitutional: No fever/chills. Eyes: No visual changes. ENT: No sore throat. Cardiovascular: Denies chest pain. Respiratory: Denies shortness of breath. Gastrointestinal: No abdominal pain.  No nausea, no vomiting.  No diarrhea.  No constipation. Genitourinary: Negative for dysuria. Musculoskeletal: Positive for right wrist pain. Negative for back pain. Skin: Negative for rash. Neurological: Negative for headaches, focal weakness. Positive for right hand numbness.  10-point ROS otherwise negative.  ____________________________________________   PHYSICAL EXAM:  VITAL SIGNS: ED Triage Vitals  Enc Vitals Group     BP 03/19/16 0531 153/83 mmHg     Pulse Rate 03/19/16 0531 109     Resp 03/19/16 0531 18     Temp 03/19/16 0531 98.2 F (36.8 C)     Temp Source 03/19/16 0531 Oral     SpO2 03/19/16 0531 100 %     Weight 03/19/16 0531 140 lb (63.504 kg)     Height 03/19/16 0531 5\' 2"  (1.575 m)     Head Cir --      Peak Flow --      Pain Score 03/19/16 0531 7     Pain Loc --  Pain Edu? --      Excl. in [place]? --     Constitutional: Alert and oriented. Well appearing and in no acute distress. Eyes: Conjunctivae are normal. PERRL. EOMI. Head: Atraumatic. Nose: No congestion/rhinnorhea. Mouth/Throat: Mucous membranes are moist.  Oropharynx non-erythematous. Neck: No stridor.  No cervical spine tenderness to palpation. Cardiovascular: Normal rate, regular rhythm. Grossly normal heart sounds.  Good peripheral circulation. Respiratory: Normal respiratory effort.  No retractions. Lungs CTAB. Gastrointestinal: Soft and nontender. No distention. No abdominal bruits. No CVA tenderness. Musculoskeletal:  RUE:  Volar aspect of wrist tender to palpation with mild swelling. + Tinel's sign. 2+ radial pulses. Brisk, less than 5 second capillary refill. Symmetrically warm limb compared to left side. Neurologic:  Normal speech and language. No gross focal neurologic deficits are appreciated. No gait instability. Skin:  Skin is warm, dry and intact. No rash noted. Psychiatric: Mood and affect are normal. Speech and behavior are normal.  ____________________________________________   LABS (all labs ordered are listed, but only abnormal results are displayed)  Labs Reviewed - No data to display ____________________________________________  EKG  None ____________________________________________  RADIOLOGY  None ____________________________________________   PROCEDURES  Procedure(s) performed: None  Critical Care performed: No  ____________________________________________   INITIAL IMPRESSION / ASSESSMENT AND PLAN / ED COURSE  Pertinent labs & imaging results that were available during my care of the patient were reviewed by me and considered in my medical decision making (see chart for details).  40 year old female who presents with right wrist pain and inflammation consistent with strain with likely component of carpal tunnel syndrome. Will start her on NSAIDs and analgesia, cockup wrist splint and orthopedics follow-up. Return precautions given. Patient verbalizes understanding and agrees with plan of care. ____________________________________________   FINAL CLINICAL IMPRESSION(S) / ED DIAGNOSES  Final diagnoses:  Carpal tunnel syndrome of right wrist  Right wrist pain      NEW MEDICATIONS STARTED DURING THIS VISIT:  New Prescriptions   HYDROCODONE-ACETAMINOPHEN (NORCO) 5-325 MG TABLET    Take 1 tablet by mouth every 6 (six) hours as needed for moderate pain.   IBUPROFEN (ADVIL,MOTRIN) 600 MG TABLET    Take 1 tablet (600 mg total) by mouth every 8 (eight) hours as needed.      Note:  This document was prepared using Dragon voice recognition software and may include unintentional dictation errors.    Paulette Blanch, MD 03/19/16 [phone]

## 2016-03-19 NOTE — ED Notes (Addendum)
Pt presents to ED with intermittent right hand pain and numbness that radiates up to her elbow. Pt states her right hand frequently feels like it is falling asleep. Denies injury to hand, arm, or back. Onset a couple of weeks ago but worsening the past several days.

## 2016-03-26 ENCOUNTER — Other Ambulatory Visit: Payer: Self-pay | Admitting: General Surgery

## 2016-03-27 ENCOUNTER — Ambulatory Visit
Admission: RE | Admit: 2016-03-27 | Discharge: 2016-03-27 | Disposition: A | Discharge status: Home / Self Care | Payer: Medicaid Other | Source: Ambulatory Visit | Attending: Internal Medicine | Admitting: Internal Medicine

## 2016-03-27 DIAGNOSIS — F17210 Nicotine dependence, cigarettes, uncomplicated: Secondary | ICD-10-CM | POA: Insufficient documentation

## 2016-03-27 DIAGNOSIS — D72829 Elevated white blood cell count, unspecified: Secondary | ICD-10-CM | POA: Diagnosis not present

## 2016-03-27 DIAGNOSIS — Z803 Family history of malignant neoplasm of breast: Secondary | ICD-10-CM | POA: Diagnosis not present

## 2016-03-27 DIAGNOSIS — Z79899 Other long term (current) drug therapy: Secondary | ICD-10-CM | POA: Diagnosis not present

## 2016-03-27 LAB — PROTIME-INR
INR: 0.96
Prothrombin Time: 13 seconds (ref 11.4–15.0)

## 2016-03-27 LAB — CBC
HCT: 44 % (ref 35.0–47.0)
Hemoglobin: 15 g/dL (ref 12.0–16.0)
MCH: 31.4 pg (ref 26.0–34.0)
MCHC: 34.2 g/dL (ref 32.0–36.0)
MCV: 91.9 fL (ref 80.0–100.0)
PLATELETS: 384 10*3/uL (ref 150–440)
RBC: 4.79 MIL/uL (ref 3.80–5.20)
RDW: 13.1 % (ref 11.5–14.5)
WBC: 15.2 10*3/uL — AB (ref 3.6–11.0)

## 2016-03-27 LAB — DIFFERENTIAL
BASOS ABS: 0.1 10*3/uL (ref 0–0.1)
BASOS PCT: 0 %
Eosinophils Absolute: 0.5 10*3/uL (ref 0–0.7)
Eosinophils Relative: 3 %
LYMPHS ABS: 3.9 10*3/uL — AB (ref 1.0–3.6)
LYMPHS PCT: 26 %
MONO ABS: 0.9 10*3/uL (ref 0.2–0.9)
MONOS PCT: 6 %
NEUTROS ABS: 9.9 10*3/uL — AB (ref 1.4–6.5)
Neutrophils Relative %: 65 %

## 2016-03-27 LAB — PREGNANCY, URINE: Preg Test, Ur: NEGATIVE

## 2016-03-27 LAB — APTT: APTT: 31 s (ref 24–36)

## 2016-03-27 MED ORDER — MIDAZOLAM HCL 5 MG/5ML IJ SOLN
INTRAMUSCULAR | Status: AC | PRN
  Administered 2016-03-27: 1 mg via INTRAVENOUS
  Administered 2016-03-27: 0.5 mg via INTRAVENOUS
  Administered 2016-03-27: 1 mg via INTRAVENOUS
  Administered 2016-03-27: 0.5 mg via INTRAVENOUS

## 2016-03-27 MED ORDER — FENTANYL CITRATE (PF) 100 MCG/2ML IJ SOLN
INTRAMUSCULAR | Status: AC | PRN
  Administered 2016-03-27: 25 ug via INTRAVENOUS
  Administered 2016-03-27: 50 ug via INTRAVENOUS

## 2016-03-27 MED ORDER — SODIUM CHLORIDE 0.9 % IV SOLN
INTRAVENOUS | Status: DC
  Administered 2016-03-27: 09:00:00 via INTRAVENOUS

## 2016-03-27 NOTE — Procedures (Signed)
Interventional Radiology Procedure Note  Procedure: CT guided aspirate and core biopsy of right posterior iliac bone Complications: None Recommendations: - Bedrest supine x 1 hrs - OTC's PRN  Pain - Follow biopsy results  Signed,  Swayzie Choate S. Clarrissa Shimkus, DO    

## 2016-03-27 NOTE — H&P (Signed)
Chief Complaint: Leukocytosis  Referring Physician(s): Cammie Sickle  Supervising Physician: Corrie Mckusick  Patient Status: Outpatient  History of Present Illness: [name] is a 40 y.o. female presenting for a bone marrow biopsy, as she has leukocytosis.   Past Medical History  Diagnosis Date  . Leukocytosis     Past Surgical History  Procedure Laterality Date  . Knee cartilage surgery Right 02/2015  . Laparoscopic tubal ligation    . Breast biopsy Right 2016    [place] core    Allergies: Review of patient's allergies indicates no known allergies.  Medications: Prior to Admission medications   Medication Sig Start Date End Date Taking? Authorizing Provider  HYDROcodone-acetaminophen (NORCO) 5-325 MG tablet Take 1 tablet by mouth every 6 (six) hours as needed for moderate pain. 03/19/16  Yes Paulette Blanch, MD  ibuprofen (ADVIL,MOTRIN) 600 MG tablet Take 1 tablet (600 mg total) by mouth every 8 (eight) hours as needed. 03/19/16  Yes Paulette Blanch, MD     Family History  Problem Relation Age of Onset  . Breast cancer Maternal Grandmother 89    Social History   Social History  . Marital Status: Married    Spouse Name: N/A  . Number of Children: N/A  . Years of Education: N/A   Social History Main Topics  . Smoking status: Current Every Day Smoker -- 1.00 packs/day for 7 years    Types: Cigarettes  . Smokeless tobacco: Never Used  . Alcohol Use: No  . Drug Use: No  . Sexual Activity: Not Asked   Other Topics Concern  . None   Social History Narrative      Review of Systems: A 12 point ROS discussed and pertinent positives are indicated in the HPI above.  All other systems are negative.  Review of Systems  Vital Signs: BP 117/74 mmHg  Pulse 72  Temp(Src) 98.4 F (36.9 C) (Oral)  Resp 14  SpO2 97%  LMP 02/21/2016  Physical Exam  Atraumatic, normocephalic.  Mucous membranes moist and pink.  Full ROM of cervical region.  CTA  bilateral.  RRR.  Neg third heart sound.  Abd S/NT/ND.  GU deferred.  Mx tatoo.  No wounds.  Normal affect and insight.   Mallampati Score:     Imaging: US Breast Ltd Uni Right Inc Axilla  03/06/2016  CLINICAL DATA:  Delayed right breast follow-up. The patient was asked to return for follow-up of a probably benign oval mass within the upper, outer right breast in addition to a probably benign mass seen on ultrasound at 11 o'clock, 4 cm from the nipple. The patient had a biopsy of a mass at 10 o'clock, 4 cm from the nipple within the right breast demonstrating fat necrosis. EXAM: 2D DIGITAL DIAGNOSTIC BILATERAL MAMMOGRAM WITH CAD AND ADJUNCT TOMO ULTRASOUND RIGHT BREAST COMPARISON:  Previous exam(s). ACR Breast Density Category b: There are scattered areas of fibroglandular density. FINDINGS: The previously noted 10 mm oval, circumscribed mass within the upper, outer right breast is unchanged from June 2016. A ribbon shaped biopsy marker is seen within the upper, outer right breast corresponding to the biopsy site demonstrating fat necrosis. No suspicious mass, calcifications, or other abnormality is identified within either breast. Mammographic images were processed with CAD. Targeted ultrasound is performed in the region of the previously seen mass at 11 o'clock, 4 cm from the nipple. The previously seen mass is no longer identified. Scattered oil cysts are seen throughout the imaged region. No  suspicious sonographic finding was identified. IMPRESSION: Probably benign right breast finding. RECOMMENDATION: Bilateral diagnostic mammogram and possible ultrasound in 1 year. I have discussed the findings and recommendations with the patient. Results were also provided in writing at the conclusion of the visit. If applicable, a reminder letter will be sent to the patient regarding the next appointment. BI-RADS CATEGORY  3: Probably benign. Electronically Signed   By: Pamelia Hoit M.D.   On: 03/06/2016 12:40    Mm Diag Breast Tomo Bilateral  03/06/2016  CLINICAL DATA:  Delayed right breast follow-up. The patient was asked to return for follow-up of a probably benign oval mass within the upper, outer right breast in addition to a probably benign mass seen on ultrasound at 11 o'clock, 4 cm from the nipple. The patient had a biopsy of a mass at 10 o'clock, 4 cm from the nipple within the right breast demonstrating fat necrosis. EXAM: 2D DIGITAL DIAGNOSTIC BILATERAL MAMMOGRAM WITH CAD AND ADJUNCT TOMO ULTRASOUND RIGHT BREAST COMPARISON:  Previous exam(s). ACR Breast Density Category b: There are scattered areas of fibroglandular density. FINDINGS: The previously noted 10 mm oval, circumscribed mass within the upper, outer right breast is unchanged from June 2016. A ribbon shaped biopsy marker is seen within the upper, outer right breast corresponding to the biopsy site demonstrating fat necrosis. No suspicious mass, calcifications, or other abnormality is identified within either breast. Mammographic images were processed with CAD. Targeted ultrasound is performed in the region of the previously seen mass at 11 o'clock, 4 cm from the nipple. The previously seen mass is no longer identified. Scattered oil cysts are seen throughout the imaged region. No suspicious sonographic finding was identified. IMPRESSION: Probably benign right breast finding. RECOMMENDATION: Bilateral diagnostic mammogram and possible ultrasound in 1 year. I have discussed the findings and recommendations with the patient. Results were also provided in writing at the conclusion of the visit. If applicable, a reminder letter will be sent to the patient regarding the next appointment. BI-RADS CATEGORY  3: Probably benign. Electronically Signed   By: Pamelia Hoit M.D.   On: 03/06/2016 12:40    Labs:  CBC:  Recent Labs  10/16/15 1422 01/15/16 2055 03/27/16 0756  WBC 14.2* 22.7* 15.2*  HGB 16.1* 15.5 15.0  HCT 47.6* 45.2 44.0  PLT 494* 440 384     COAGS:  Recent Labs  03/27/16 0756  INR 0.96  APTT 31    BMP:  Recent Labs  10/16/15 1422 01/15/16 2055  NA 137 135  K 3.7 3.9  CL 99* 100*  CO2 27 28  GLUCOSE 75 95  BUN 10 13  CALCIUM 9.7 9.5  CREATININE 0.58 0.66  GFRNONAA >60 >60  GFRAA >60 >60    LIVER FUNCTION TESTS:  Recent Labs  10/16/15 1422 01/15/16 2055  BILITOT 1.1 1.1  AST 19 22  ALT 20 31  ALKPHOS 91 68  PROT 8.4* 7.6  ALBUMIN 4.9 4.5    TUMOR MARKERS: No results for input(s): AFPTM, CEA, CA199, CHROMGRNA in the last 8760 hours.  Assessment and Plan:  40 year old female with leukocytosis, presents for bone marrow biopsy.   Risks and Benefits discussed with the patient including, but not limited to bleeding, infection, damage to adjacent structures or low yield requiring additional tests. All of the patient's questions were answered, patient is agreeable to proceed. Consent signed and in chart.  Plan is to proceed.    Thank you for this interesting consult.  I greatly enjoyed  meeting Corie Chiquito and look forward to participating in their care.  A copy of this report was sent to the requesting provider on this date.  Electronically Signed: Corrie Mckusick 03/27/2016, 8:58 AM   I spent a total of  15 Minutes   in face to face in clinical consultation, greater than 50% of which was counseling/coordinating care for leukocytosis, bone marrow biopsy.

## 2016-04-01 ENCOUNTER — Inpatient Hospital Stay: Payer: Medicaid Other | Attending: Internal Medicine | Admitting: Internal Medicine

## 2016-04-01 VITALS — BP 128/88 | HR 96 | Temp 98.6°F | Resp 18 | Wt 149.5 lb

## 2016-04-01 DIAGNOSIS — Z79899 Other long term (current) drug therapy: Secondary | ICD-10-CM | POA: Diagnosis not present

## 2016-04-01 DIAGNOSIS — K219 Gastro-esophageal reflux disease without esophagitis: Secondary | ICD-10-CM | POA: Diagnosis not present

## 2016-04-01 DIAGNOSIS — F17210 Nicotine dependence, cigarettes, uncomplicated: Secondary | ICD-10-CM | POA: Diagnosis not present

## 2016-04-01 DIAGNOSIS — D72829 Elevated white blood cell count, unspecified: Secondary | ICD-10-CM

## 2016-04-01 NOTE — Assessment & Plan Note (Addendum)
#  Mild Leukocytosis-~15 at baseline;  predominant neutrophilia;BCR ABL negative; negative on flow cytometry. Bone marrow biopsy July 2017- mild leukocytosis; not suggestive of any myeloproliferative disorder; likely reactive. Awaiting on cytogenetics. Most likely patient's mild leukocytosis is reactive/secondary; although the reason is unclear. I discussed the patient and family in detail. She was given a copy of the bone marrow biopsy. I would not recommend a further follow-ups unless needed.  # I recommend quitting smoking.  # 15  minutes face-to-face with the patient discussing the above plan of care; more than 50% of time spent on counseling and coordination of care.

## 2016-04-01 NOTE — Progress Notes (Signed)
Patient here today for test results. 

## 2016-04-01 NOTE — Progress Notes (Signed)
[place] NOTE  Patient Care Team: [name], NP as PCP - General (Nurse Practitioner)  CHIEF COMPLAINTS/PURPOSE OF CONSULTATION:   #2004 Leucocytosis/ mild 14-19k- likely secondary to smoking; bcr-abl-NEG; flowcytometry-NEG.  July 2017- BMBx- negative for malignancy/negative for myeloproliferative disorder  # ? Right lobe of liver lesion- 10.2 x 5.3x 5.7 cm echogenic lesion [US- Jan 2015; increase from 4.3cm from 2009]; worked at [employer]- neg per pt- No bx.]; April 2017 CT scan negative for any suspicion for malignancy.  HISTORY OF PRESENTING ILLNESS:  [name] 40 y.o.  female long-standing history of smoking and history of leukocytosis- likely secondary to reactive smoking- is here for follow-up today with the results of the bone marrow biopsy.  The bone marrow biopsy procedure was uneventful denies any pain or any bleeding.  No new fevers or chills or weight loss. Patient denies any unusual night sweats or lumps or bumps. Unfortunately continues to smoke.   ROS: A complete 10 point review of system is done which is negative except mentioned above in history of present illness  MEDICAL HISTORY:  Past Medical History  Diagnosis Date  . Leukocytosis    reflux disease  SURGICAL HISTORY: Breast biopsy negative. Past Surgical History  Procedure Laterality Date  . Knee cartilage surgery Right 02/2015  . Laparoscopic tubal ligation    . Breast biopsy Right 2016    [place] core    SOCIAL HISTORY: Smokes pack of cigarettes a day; occasional alcohol. She works at [employer] History  . Marital Status: Married    Spouse Name: N/A  . Number of Children: N/A  . Years of Education: N/A   Occupational History  . Not on file.   Social History Main Topics  . Smoking status: Current Every Day Smoker -- 1.00 packs/day for 7 years    Types: Cigarettes  . Smokeless tobacco: Never Used  . Alcohol Use: No  . Drug Use: No   . Sexual Activity: Not on file   Other Topics Concern  . Not on file   Social History Narrative    FAMILY HISTORY: Family History  Problem Relation Age of Onset  . Breast cancer Maternal Grandmother 40    ALLERGIES:  has No Known Allergies.  MEDICATIONS:  Current Outpatient Prescriptions  Medication Sig Dispense Refill  . HYDROcodone-acetaminophen (NORCO) 5-325 MG tablet Take 1 tablet by mouth every 6 (six) hours as needed for moderate pain. 15 tablet 0  . ibuprofen (ADVIL,MOTRIN) 600 MG tablet Take 1 tablet (600 mg total) by mouth every 8 (eight) hours as needed. 15 tablet 0   No current facility-administered medications for this visit.      Marland Kitchen  PHYSICAL EXAMINATION:   Filed Vitals:   04/01/16 0951  BP: 128/88  Pulse: 96  Temp: 98.6 F (37 C)  Resp: 18   Filed Weights   04/01/16 0951  Weight: 149 lb 7.6 oz (67.8 kg)    GENERAL: Well-nourished well-developed; Alert, no distress and she is alone.    LABORATORY DATA:  I have reviewed the data as listed Lab Results  Component Value Date   WBC 15.2* 03/27/2016   HGB 15.0 03/27/2016   HCT 44.0 03/27/2016   MCV 91.9 03/27/2016   PLT 384 03/27/2016    Recent Labs  10/16/15 1422 01/15/16 2055  NA 137 135  K 3.7 3.9  CL 99* 100*  CO2 27 28  GLUCOSE 75 95  BUN 10 13  CREATININE 0.58 0.66  CALCIUM 9.7 9.5  GFRNONAA >60 >60  GFRAA >60 >60  PROT 8.4* 7.6  ALBUMIN 4.9 4.5  AST 19 22  ALT 20 31  ALKPHOS 91 68  BILITOT 1.1 1.1    RADIOGRAPHIC STUDIES: I have personally reviewed the radiological images as listed and agreed with the findings in the report. Ct Biopsy  03/27/2016  INDICATION: 40 year old female with a history of leukocytosis EXAM: CT BIOPSY MEDICATIONS: None. ANESTHESIA/SEDATION: Moderate (conscious) sedation was employed during this procedure. A total of Versed 3.0 mg and Fentanyl 75 mcg was administered intravenously. Moderate Sedation Time: 13 minutes. The patient's level of  consciousness and vital signs were monitored continuously by radiology nursing throughout the procedure under my direct supervision. FLUOROSCOPY TIME:  CT COMPLICATIONS: None PROCEDURE: The procedure risks, benefits, and alternatives were explained to the patient. Questions regarding the procedure were encouraged and answered. The patient understands and consents to the procedure. Scout CT of the pelvis was performed for surgical planning purposes. The posterior pelvis was prepped with Betadinein a sterile fashion, and a sterile drape was applied covering the operative field. A sterile gown and sterile gloves were used for the procedure. Local anesthesia was provided with 1% Lidocaine. We targeted the right posterior iliac bone for biopsy. The skin and subcutaneous tissues were infiltrated with 1% lidocaine without epinephrine. A small stab incision was made with an 11 blade scalpel, and an 11 gauge Murphy needle was advanced with CT guidance to the posterior cortex. Manual forced was used to advance the needle through the posterior cortex and the stylet was removed. A bone marrow aspirate was retrieved and passed to a cytotechnologist in the room. The Murphy needle was then advanced without the stylet for a core biopsy. The core biopsy was retrieved and also passed to a cytotechnologist. Manual pressure was used for hemostasis and a sterile dressing was placed. No complications were encountered no significant blood loss was encountered. Patient tolerated the procedure well and remained hemodynamically stable throughout. IMPRESSION: Status post CT-guided bone marrow biopsy, with tissue specimen sent to pathology for complete histopathologic analysis Signed, Dulcy Fanny. Earleen Newport, DO [facility] Radiology Electronically Signed   By: Corrie Mckusick D.O.   On: 03/27/2016 09:42   US Breast Ltd Uni Right Inc Axilla  03/06/2016  CLINICAL DATA:  Delayed right breast follow-up.  The patient was asked to return for follow-up of a probably benign oval mass within the upper, outer right breast in addition to a probably benign mass seen on ultrasound at 11 o'clock, 4 cm from the nipple. The patient had a biopsy of a mass at 10 o'clock, 4 cm from the nipple within the right breast demonstrating fat necrosis. EXAM: 2D DIGITAL DIAGNOSTIC BILATERAL MAMMOGRAM WITH CAD AND ADJUNCT TOMO ULTRASOUND RIGHT BREAST COMPARISON:  Previous exam(s). ACR Breast Density Category b: There are scattered areas of fibroglandular density. FINDINGS: The previously noted 10 mm oval, circumscribed mass within the upper, outer right breast is unchanged from June 2016. A ribbon shaped biopsy marker is seen within the upper, outer right breast corresponding to the biopsy site demonstrating fat necrosis. No suspicious mass, calcifications, or other abnormality is identified within either breast. Mammographic images were processed with CAD. Targeted ultrasound is performed in the region of the previously seen mass at 11 o'clock, 4 cm from the nipple. The previously seen mass is no longer identified. Scattered oil cysts are seen throughout the imaged region. No suspicious sonographic finding was  identified. IMPRESSION: Probably benign right breast finding. RECOMMENDATION: Bilateral diagnostic mammogram and possible ultrasound in 1 year. I have discussed the findings and recommendations with the patient. Results were also provided in writing at the conclusion of the visit. If applicable, a reminder letter will be sent to the patient regarding the next appointment. BI-RADS CATEGORY  3: Probably benign. Electronically Signed   By: Pamelia Hoit M.D.   On: 03/06/2016 12:40   Mm Diag Breast Tomo Bilateral  03/06/2016  CLINICAL DATA:  Delayed right breast follow-up. The patient was asked to return for follow-up of a probably benign oval mass within the upper, outer right breast in addition to a probably benign mass seen on  ultrasound at 11 o'clock, 4 cm from the nipple. The patient had a biopsy of a mass at 10 o'clock, 4 cm from the nipple within the right breast demonstrating fat necrosis. EXAM: 2D DIGITAL DIAGNOSTIC BILATERAL MAMMOGRAM WITH CAD AND ADJUNCT TOMO ULTRASOUND RIGHT BREAST COMPARISON:  Previous exam(s). ACR Breast Density Category b: There are scattered areas of fibroglandular density. FINDINGS: The previously noted 10 mm oval, circumscribed mass within the upper, outer right breast is unchanged from June 2016. A ribbon shaped biopsy marker is seen within the upper, outer right breast corresponding to the biopsy site demonstrating fat necrosis. No suspicious mass, calcifications, or other abnormality is identified within either breast. Mammographic images were processed with CAD. Targeted ultrasound is performed in the region of the previously seen mass at 11 o'clock, 4 cm from the nipple. The previously seen mass is no longer identified. Scattered oil cysts are seen throughout the imaged region. No suspicious sonographic finding was identified. IMPRESSION: Probably benign right breast finding. RECOMMENDATION: Bilateral diagnostic mammogram and possible ultrasound in 1 year. I have discussed the findings and recommendations with the patient. Results were also provided in writing at the conclusion of the visit. If applicable, a reminder letter will be sent to the patient regarding the next appointment. BI-RADS CATEGORY  3: Probably benign. Electronically Signed   By: Pamelia Hoit M.D.   On: 03/06/2016 12:40    ASSESSMENT & PLAN:   Leucocytosis # Mild Leukocytosis-~15 at baseline;  predominant neutrophilia;BCR ABL negative; negative on flow cytometry. Bone marrow biopsy July 2017- mild leukocytosis; not suggestive of any myeloproliferative disorder; likely reactive. Awaiting on cytogenetics. Most likely patient's mild leukocytosis is reactive/secondary; although the reason is unclear. I discussed the patient and family  in detail. She was given a copy of the bone marrow biopsy. I would not recommend a further follow-ups unless needed.  # I recommend quitting smoking.  # 15  minutes face-to-face with the patient discussing the above plan of care; more than 50% of time spent on counseling and coordination of care.     Cammie Sickle, MD 04/01/2016 10:10 AM

## 2016-04-29 ENCOUNTER — Inpatient Hospital Stay: Payer: Medicaid Other | Attending: Internal Medicine

## 2016-05-06 ENCOUNTER — Inpatient Hospital Stay: Payer: Medicaid Other | Admitting: Internal Medicine

## 2016-05-20 ENCOUNTER — Encounter: Payer: Self-pay | Admitting: *Deleted

## 2016-05-20 ENCOUNTER — Inpatient Hospital Stay: Payer: Medicaid Other | Admitting: Internal Medicine

## 2016-05-30 IMAGING — US US  BREAST BX W/ LOC DEV 1ST LESION IMG BX SPEC US GUIDE*R*
2 series · 12 of 12 positions shown · non-contrast
Comparison: Previous exam(s).

ADDENDUM:
Pathology of the right breast biopsy revealed FAT NECROSIS WITH
FIBROSIS AND CALCIFICATION. Comment: There is no epithelial
proliferation, atypia, or malignancy in this specimen. This was
found to be concordant by Dr. Blessed Zollinger.

At the patient's request, pathology and recommendations were relayed
to the patient by phone. She stated she had pain yesterday but is
doing well today. She denies any bleeding, bruising or hematoma.
Post biopsy instructions were reviewed with the patient. She was
encouraged to call the [REDACTED]
Recommendations: Recommend three month follow-up diagnostic
mammogram and ultrasound of the right breast.
Pathology and recommendations relayed by Sheriff, Adee on
03/02/15.
CLINICAL DATA: Patient presents for ultrasound-guided core biopsy
of right breast mass.
EXAM:
ULTRASOUND GUIDED RIGHT BREAST CORE NEEDLE BIOPSY

[Series 1: us breast bx w/ loc dev 1st lesion img bx spec us  · 0.08mm/px · 11 of 11 slices shown (1 of 2)]
[im 1/11]
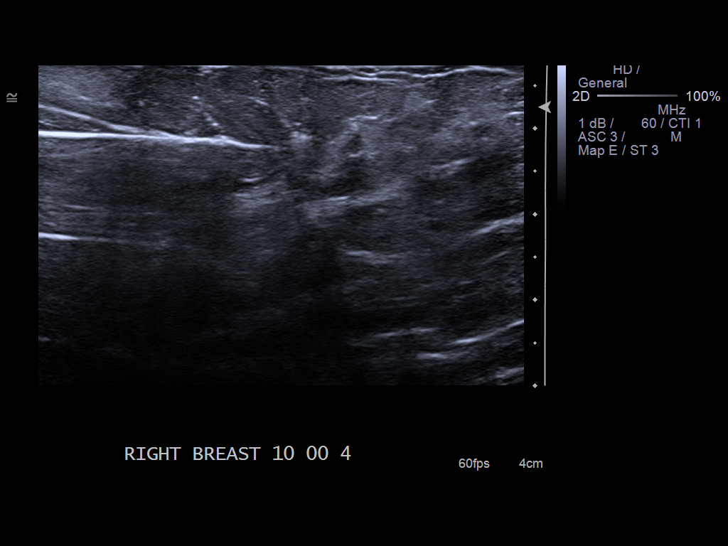
[im 2/11]
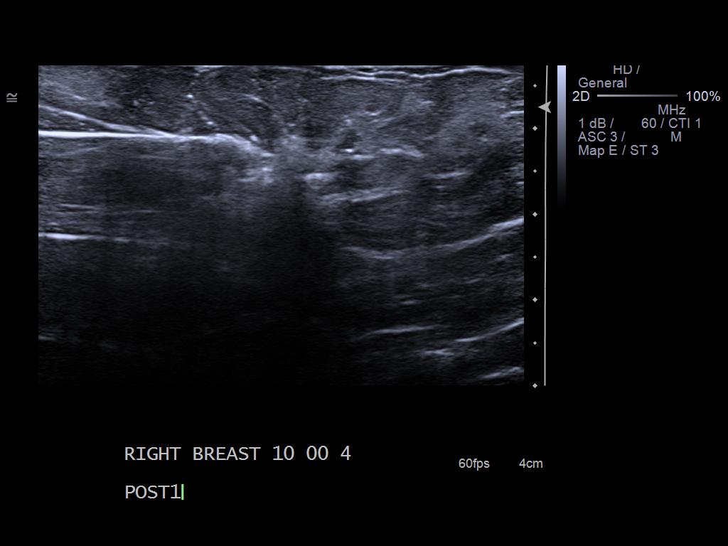
[im 3/11]
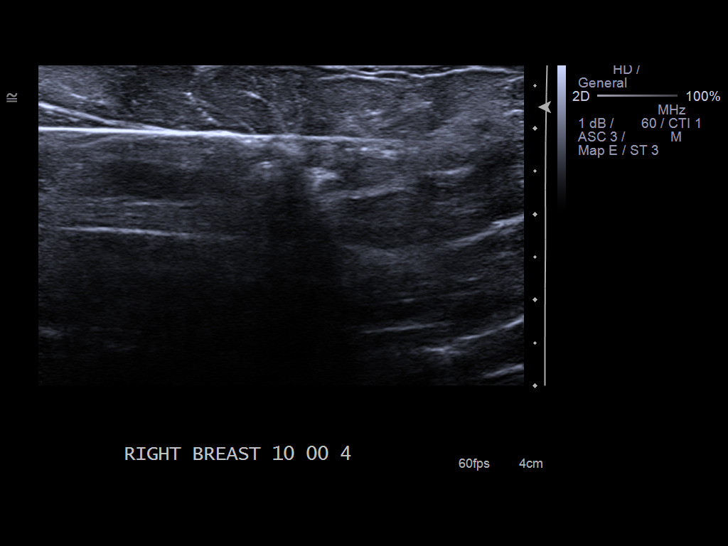
[im 4/11]
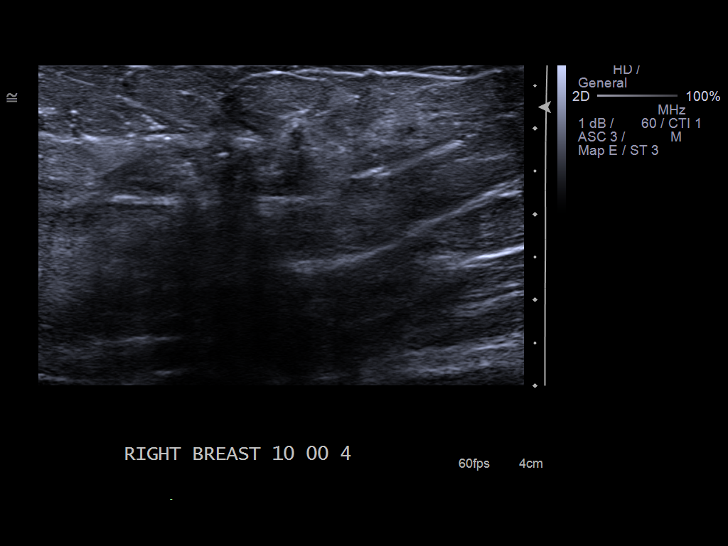
[im 5/11]
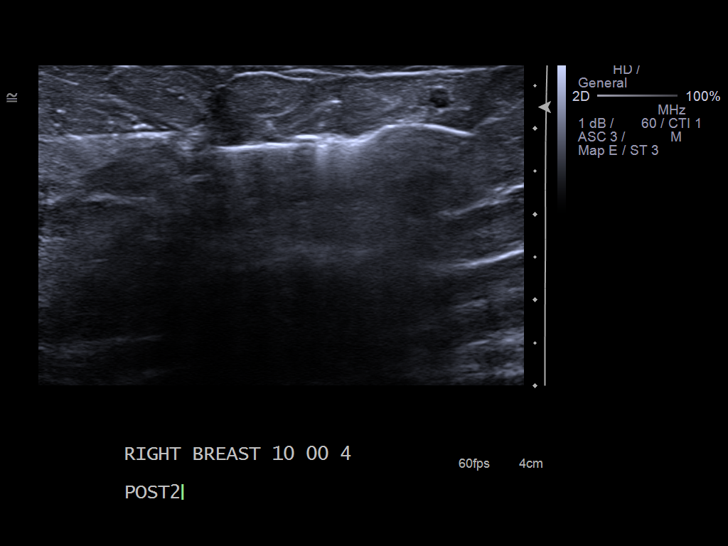
[im 6/11]
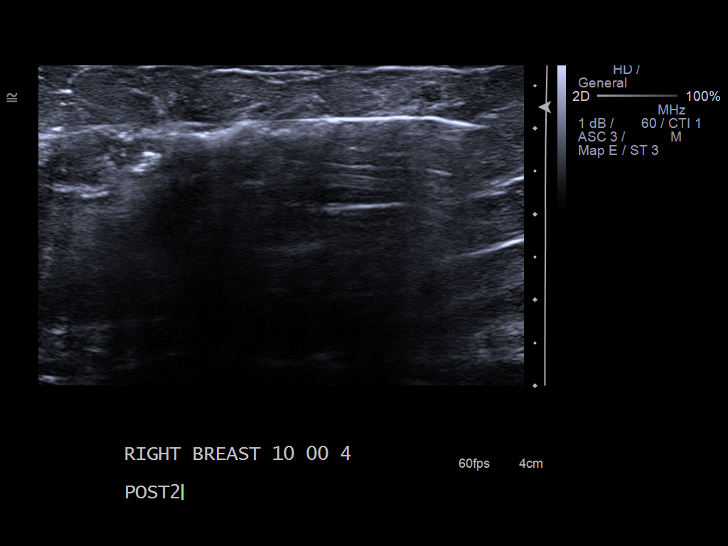
[im 7/11]
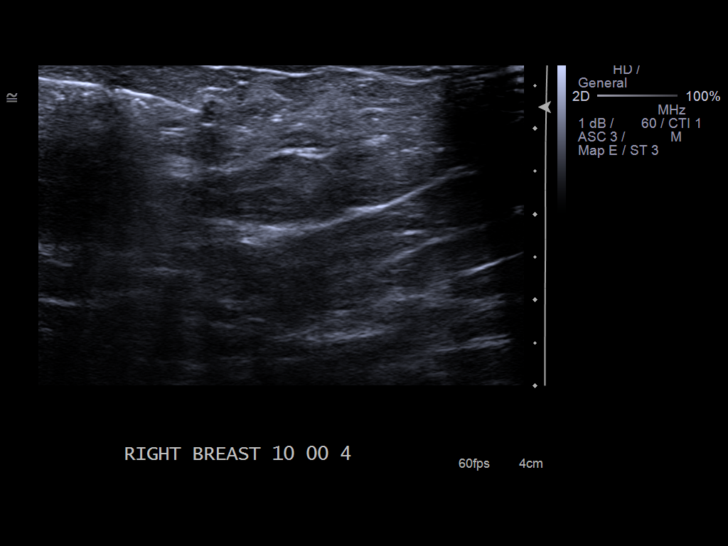
[im 8/11]
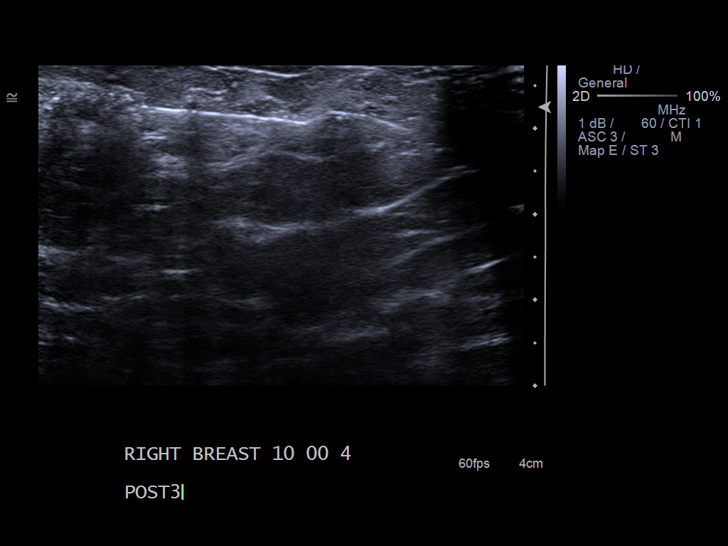
[im 9/11]
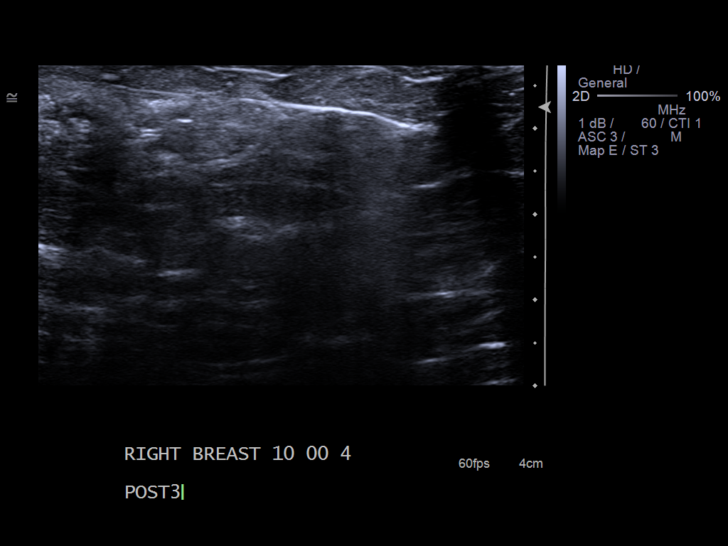
[im 10/11]
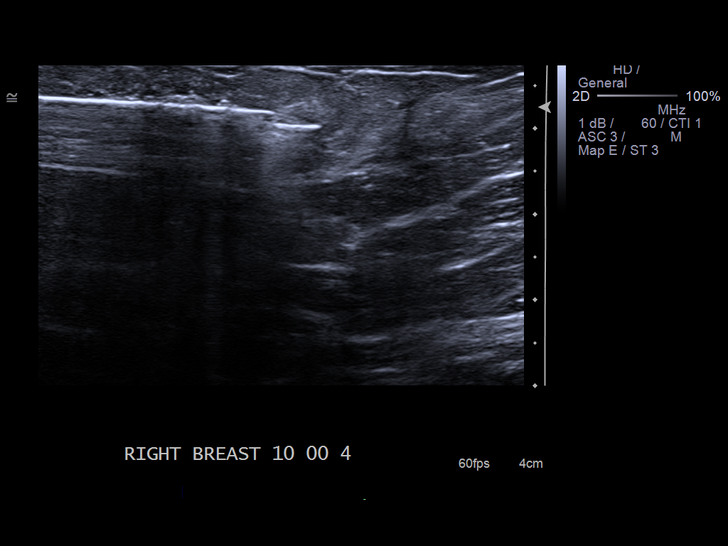
[im 11/11]
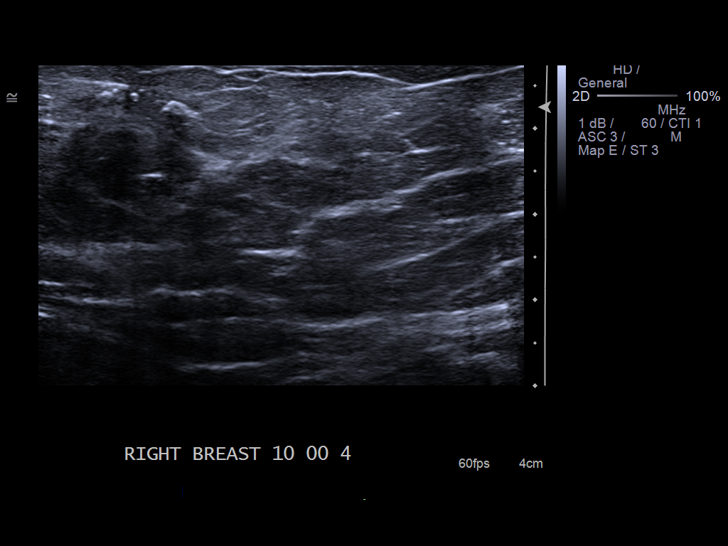

[Series 2: us breast bx w/ loc dev 1st lesion img bx spec us  · 0.09mm/px · 1 of 1 slices shown (2 of 2)]
[im 1/1]
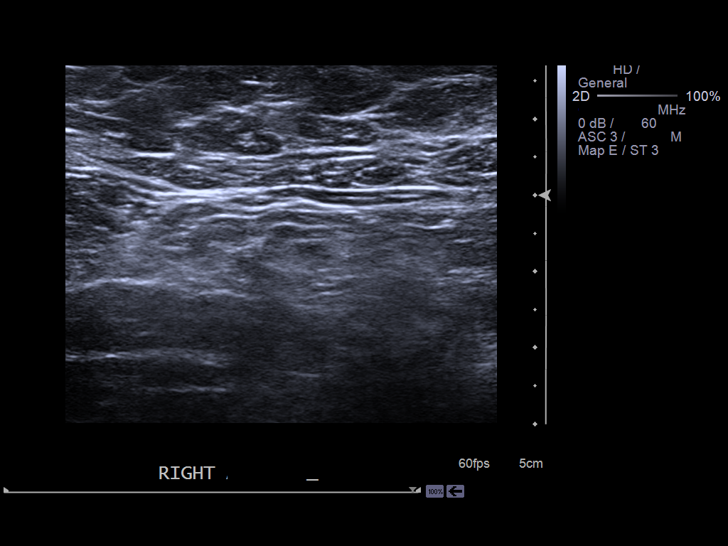

[12 of 12 positions shown; findings below may reference images not displayed]

PROCEDURE:
Initially ultrasound is performed, confirming presence of the
previously described abnormalities. Additionally sonographic search
is performed for a correlate for the hyperdense nodule seen in the
far upper-outer quadrant of the right breast, posterior in location.
A mixed echogenicity nodule is identified superficial in location in
the 930 o'clock location 12 cm from the nipple which measures 0.7 x
1.3 x 0.6 cm. A BB is placed over this area for subsequent
mammogram.

I met with the patient and we discussed the procedure of
ultrasound-guided biopsy, including benefits and alternatives. We
discussed the high likelihood of a successful procedure. We
discussed the risks of the procedure including infection, bleeding,
tissue injury, clip migration, and inadequate sampling. Informed
written consent was given. The usual time-out protocol was performed
immediately prior to the procedure.

Using sterile technique and 2% Lidocaine as local anesthetic, under
direct ultrasound visualization, a 12 gauge vacuum-assisted device
was used to perform biopsy of mass in the 10 o'clock location of the
right breast 4 cm from the nippleusing a caudal approach. At the
conclusion of the procedure, a ribbon shaped tissue marker clip was
deployed into the biopsy cavity. Follow-up 2-view mammogram was
performed and dictated separately.
IMPRESSION: Ultrasound-guided biopsy of right breast mass. No apparent
complications.

## 2016-06-19 ENCOUNTER — Encounter: Payer: Self-pay | Admitting: *Deleted

## 2016-06-19 ENCOUNTER — Ambulatory Visit
Admission: EM | Admit: 2016-06-19 | Discharge: 2016-06-19 | Disposition: A | Discharge status: Home / Self Care | Payer: Medicaid Other | Attending: Family Medicine | Admitting: Family Medicine

## 2016-06-19 DIAGNOSIS — R0981 Nasal congestion: Secondary | ICD-10-CM | POA: Diagnosis not present

## 2016-06-19 DIAGNOSIS — J069 Acute upper respiratory infection, unspecified: Secondary | ICD-10-CM | POA: Diagnosis not present

## 2016-06-19 DIAGNOSIS — J029 Acute pharyngitis, unspecified: Secondary | ICD-10-CM | POA: Insufficient documentation

## 2016-06-19 DIAGNOSIS — R05 Cough: Secondary | ICD-10-CM | POA: Insufficient documentation

## 2016-06-19 DIAGNOSIS — B9789 Other viral agents as the cause of diseases classified elsewhere: Secondary | ICD-10-CM

## 2016-06-19 DIAGNOSIS — F17210 Nicotine dependence, cigarettes, uncomplicated: Secondary | ICD-10-CM | POA: Diagnosis not present

## 2016-06-19 LAB — RAPID INFLUENZA A&B ANTIGENS (ARMC ONLY): INFLUENZA A (ARMC): NEGATIVE

## 2016-06-19 LAB — RAPID STREP SCREEN (MED CTR MEBANE ONLY): STREPTOCOCCUS, GROUP A SCREEN (DIRECT): NEGATIVE

## 2016-06-19 LAB — RAPID INFLUENZA A&B ANTIGENS: Influenza B (ARMC): NEGATIVE

## 2016-06-19 MED ORDER — BENZONATATE 100 MG PO CAPS: 200.0000 mg | ORAL_CAPSULE | Freq: Three times a day (TID) | ORAL | 0 refills | Status: DC | PRN

## 2016-06-19 MED ORDER — FEXOFENADINE-PSEUDOEPHED ER 180-240 MG PO TB24: 1.0000 | ORAL_TABLET | Freq: Every day | ORAL | 0 refills | Status: DC

## 2016-06-19 NOTE — ED Provider Notes (Addendum)
[facility]    CSN: [id number] Arrival date & time: 06/19/16  1049     History   Chief Complaint Chief Complaint  Patient presents with  . Cough  . Sore Throat  . Nasal Congestion    HPI [name] is a 40 y.o. female.    Patient reports having a sore throat started this morning. She states spent some time with a friend who was diagnosed with flu about 2 days ago. She went to work earlier this morning but didn't feel well and left. She states it feels as last throat scratching and irritating her throat past medical history she does smoke she has a history of leukocytosis and previous surgery of breast biopsies, had a laparoscopic tubal ligation and knee cartilage surgery. Maternal history positive for breast cancer.   The history is provided by the patient. No language interpreter was used.  Sore Throat  This is a new problem. The current episode started 6 to 12 hours ago. The problem occurs constantly. The problem has not changed since onset.Pertinent negatives include no chest pain, no abdominal pain, no headaches and no shortness of breath. Nothing aggravates the symptoms. Nothing relieves the symptoms. She has tried nothing for the symptoms. The treatment provided no relief.    Past Medical History:  Diagnosis Date  . Leukocytosis     Patient Active Problem List   Diagnosis Date Noted  . Leucocytosis 03/04/2016    Past Surgical History:  Procedure Laterality Date  . BREAST BIOPSY Right 2016   [place] core  . KNEE CARTILAGE SURGERY Right 02/2015  . LAPAROSCOPIC TUBAL LIGATION      OB History    No data available       Home Medications    Prior to Admission medications   Medication Sig Start Date End Date Taking? Authorizing Provider  HYDROcodone-acetaminophen (NORCO) 5-325 MG tablet Take 1 tablet by mouth every 6 (six) hours as needed for moderate pain. 03/19/16   Paulette Blanch, MD  ibuprofen (ADVIL,MOTRIN) 600 MG tablet Take 1 tablet (600 mg  total) by mouth every 8 (eight) hours as needed. 03/19/16   Paulette Blanch, MD    Family History Family History  Problem Relation Age of Onset  . Breast cancer Maternal Grandmother 86    Social History Social History  Substance Use Topics  . Smoking status: Current Every Day Smoker    Packs/day: 1.00    Years: 7.00    Types: Cigarettes  . Smokeless tobacco: Never Used  . Alcohol use No     Allergies   Review of patient's allergies indicates no known allergies.   Review of Systems Review of Systems  Constitutional: Negative for activity change and appetite change.  HENT: Positive for congestion, rhinorrhea and sore throat. Negative for trouble swallowing.   Respiratory: Positive for cough. Negative for shortness of breath.   Cardiovascular: Negative for chest pain.  Gastrointestinal: Negative for abdominal pain.  Neurological: Negative for headaches.  All other systems reviewed and are negative.    Physical Exam Triage Vital Signs ED Triage Vitals  Enc Vitals Group     BP 06/19/16 1121 132/83     Pulse Rate 06/19/16 1121 88     Resp 06/19/16 1121 16     Temp 06/19/16 1121 98.8 F (37.1 C)     Temp Source 06/19/16 1121 Oral     SpO2 06/19/16 1121 100 %     Weight 06/19/16 1122 160 lb (72.6 kg)  Height 06/19/16 1122 5\' 2"  (1.575 m)     Head Circumference --      Peak Flow --      Pain Score --      Pain Loc --      Pain Edu? --      Excl. in [place]? --    No data found.   Updated Vital Signs BP 132/83 (BP Location: Left Arm)   Pulse 88   Temp 98.8 F (37.1 C) (Oral)   Resp 16   Ht 5\' 2"  (1.575 m)   Wt 160 lb (72.6 kg)   LMP 05/23/2016 (Approximate)   SpO2 100%   BMI 29.26 kg/m   Visual Acuity Right Eye Distance:   Left Eye Distance:   Bilateral Distance:    Right Eye Near:   Left Eye Near:    Bilateral Near:     Physical Exam  Constitutional: She is oriented to person, place, and time. She appears well-developed and well-nourished.  HENT:    Head: Normocephalic and atraumatic.  Right Ear: Hearing, tympanic membrane, external ear and ear canal normal.  Left Ear: Hearing, tympanic membrane, external ear and ear canal normal.  Nose: Mucosal edema and rhinorrhea present. Right sinus exhibits no maxillary sinus tenderness and no frontal sinus tenderness. Left sinus exhibits no maxillary sinus tenderness and no frontal sinus tenderness.  Mouth/Throat: Uvula is midline. No oral lesions. No dental abscesses, uvula swelling or dental caries. Posterior oropharyngeal erythema present.  Eyes: Pupils are equal, round, and reactive to light.  Neck: Normal range of motion. Neck supple.  Pulmonary/Chest: Effort normal.  Musculoskeletal: Normal range of motion.  Lymphadenopathy:    She has cervical adenopathy.  Neurological: She is oriented to person, place, and time.  Skin: Skin is warm.  Psychiatric: She has a normal mood and affect.  Vitals reviewed.    UC Treatments / Results  Labs (all labs ordered are listed, but only abnormal results are displayed) Labs Reviewed - No data to display  EKG  EKG Interpretation None       Radiology No results found.  Procedures Procedures (including critical care time)  Medications Ordered in UC Medications - No data to display   Initial Impression / Assessment and Plan / UC Course  I have reviewed the triage vital signs and the nursing notes.  Pertinent labs & imaging results that were available during my care of the patient were reviewed by me and considered in my medical decision making (see chart for details).    Results for orders placed or performed during the hospital encounter of 06/19/16  Rapid strep screen  Result Value Ref Range   Streptococcus, Group A Screen (Direct) NEGATIVE NEGATIVE  Rapid Influenza A&B Antigens ([facility] only)  Result Value Ref Range   Influenza A ([facility]) NEGATIVE NEGATIVE   Influenza B ([facility]) NEGATIVE NEGATIVE   Clinical Course   Patient informed  strep test is negative flu test was negative strep culture is still pending also informed patient this of a viral illness. We'll place her on some Allegra-D Tessalon Perles for the cough. Initially patient request a note just to cover her for today at work but then later at the note was written wanted another note just in case she does not feel well tomorrow. We'll give her another work note for today and tomorrow as well follow-up with her PCP if not better in a week    Final Clinical Impressions(s) / UC Diagnoses   Final diagnoses:  None    New Prescriptions New Prescriptions   No medications on file     Frederich Cha, MD 06/19/16 [place], MD 06/19/16 2205

## 2016-06-19 NOTE — ED Triage Notes (Signed)
Non-productive cough, sore throat, congestion, headache, onset this morning. States has been working with a co-worker that was recently dx with flu and on Tamiflu.

## 2016-06-21 LAB — CULTURE, GROUP A STREP (THRC)

## 2016-07-15 ENCOUNTER — Emergency Department
Admission: EM | Admit: 2016-07-15 | Discharge: 2016-07-15 | Disposition: A | Discharge status: Home / Self Care | Payer: Medicaid Other | Attending: Emergency Medicine | Admitting: Emergency Medicine

## 2016-07-15 ENCOUNTER — Encounter: Payer: Self-pay | Admitting: Emergency Medicine

## 2016-07-15 DIAGNOSIS — M779 Enthesopathy, unspecified: Secondary | ICD-10-CM | POA: Diagnosis not present

## 2016-07-15 DIAGNOSIS — F17210 Nicotine dependence, cigarettes, uncomplicated: Secondary | ICD-10-CM | POA: Insufficient documentation

## 2016-07-15 DIAGNOSIS — M778 Other enthesopathies, not elsewhere classified: Secondary | ICD-10-CM

## 2016-07-15 DIAGNOSIS — M79642 Pain in left hand: Secondary | ICD-10-CM | POA: Diagnosis present

## 2016-07-15 MED ORDER — TRAMADOL HCL 50 MG PO TABS: 50.0000 mg | ORAL_TABLET | Freq: Four times a day (QID) | ORAL | 0 refills | Status: DC | PRN

## 2016-07-15 MED ORDER — NAPROXEN 500 MG PO TABS: 500.0000 mg | ORAL_TABLET | Freq: Two times a day (BID) | ORAL | 0 refills | Status: DC

## 2016-07-15 MED ORDER — IBUPROFEN 600 MG PO TABS
600.0000 mg | ORAL_TABLET | Freq: Once | ORAL | Status: AC
  Administered 2016-07-15: 600 mg via ORAL
  Filled 2016-07-15: qty 1

## 2016-07-15 MED ORDER — TRAMADOL HCL 50 MG PO TABS
50.0000 mg | ORAL_TABLET | Freq: Once | ORAL | Status: AC
  Administered 2016-07-15: 50 mg via ORAL
  Filled 2016-07-15: qty 1

## 2016-07-15 NOTE — ED Notes (Signed)
See triage note  States she woke up about 130 am with pain from left hand which is radiating into upper arm.. Denies any injury  No swelling   But area is tender to touch.Marland Kitchen

## 2016-07-15 NOTE — ED Triage Notes (Signed)
Reports woke up with pain in left hand, worse with movement.  Radiates to shoulder.  State she does repetitive movements with hands at work.

## 2016-07-15 NOTE — ED Provider Notes (Signed)
[facility] Emergency Department Provider Note   ____________________________________________   First MD Initiated Contact with Patient 07/15/16 [phone]     (approximate)  I have reviewed the triage vital signs and the nursing notes.   HISTORY  Chief Complaint Hand Pain    HPI [name] is a 40 y.o. female patient complain of left hand pain that radiates to the forearm medication to the shoulder. Patient states she awakened this morning with this complaint. Patient states she went to bed with no complaint of pain. Patient state no obvious provocative incident for her pain. Patient admits to repetitive hand motions at work.Patient rates the pain as 8/10. Patient described a pain as "achy". Patient is right-hand dominant.   Past Medical History:  Diagnosis Date  . Leukocytosis     Patient Active Problem List   Diagnosis Date Noted  . Leucocytosis 03/04/2016    Past Surgical History:  Procedure Laterality Date  . BREAST BIOPSY Right 2016   [place] core  . KNEE CARTILAGE SURGERY Right 02/2015  . LAPAROSCOPIC TUBAL LIGATION      Prior to Admission medications   Medication Sig Start Date End Date Taking? Authorizing Provider  benzonatate (TESSALON) 100 MG capsule Take 2 capsules (200 mg total) by mouth 3 (three) times daily as needed for cough. 06/19/16   Frederich Cha, MD  fexofenadine-pseudoephedrine (ALLEGRA-D ALLERGY & CONGESTION) 180-240 MG 24 hr tablet Take 1 tablet by mouth daily. 06/19/16   Frederich Cha, MD  HYDROcodone-acetaminophen (NORCO) 5-325 MG tablet Take 1 tablet by mouth every 6 (six) hours as needed for moderate pain. 03/19/16   Paulette Blanch, MD  ibuprofen (ADVIL,MOTRIN) 600 MG tablet Take 1 tablet (600 mg total) by mouth every 8 (eight) hours as needed. 03/19/16   Paulette Blanch, MD    Allergies Review of patient's allergies indicates no known allergies.  Family History  Problem Relation Age of Onset  . Breast cancer Maternal  Grandmother 79    Social History Social History  Substance Use Topics  . Smoking status: Current Every Day Smoker    Packs/day: 1.00    Years: 7.00    Types: Cigarettes  . Smokeless tobacco: Never Used  . Alcohol use No    Review of Systems Constitutional: No fever/chills Eyes: No visual changes. ENT: No sore throat. Cardiovascular: Denies chest pain. Respiratory: Denies shortness of breath. Gastrointestinal: No abdominal pain.  No nausea, no vomiting.  No diarrhea.  No constipation. Genitourinary: Negative for dysuria. Musculoskeletal:Left upper extremity pain Skin: Negative for rash. Neurological: Negative for headaches, focal weakness or numbness.    ____________________________________________   PHYSICAL EXAM:  VITAL SIGNS: ED Triage Vitals  Enc Vitals Group     BP 07/15/16 0724 (!) 151/83     Pulse Rate 07/15/16 0724 98     Resp 07/15/16 0724 18     Temp 07/15/16 0724 98.3 F (36.8 C)     Temp Source 07/15/16 0724 Oral     SpO2 07/15/16 0724 100 %     Weight 07/15/16 0719 150 lb (68 kg)     Height 07/15/16 0719 5\' 5"  (1.651 m)     Head Circumference --      Peak Flow --      Pain Score 07/15/16 0720 8     Pain Loc --      Pain Edu? --      Excl. in [place]? --     Constitutional: Alert and oriented. Well appearing  and in no acute distress. Eyes: Conjunctivae are normal. PERRL. EOMI. Head: Atraumatic. Nose: No congestion/rhinnorhea. Mouth/Throat: Mucous membranes are moist.  Oropharynx non-erythematous. Neck: No stridor.  No cervical spine tenderness to palpation. Hematological/Lymphatic/Immunilogical: No cervical lymphadenopathy. Cardiovascular: Normal rate, regular rhythm. Grossly normal heart sounds.  Good peripheral circulation. Respiratory: Normal respiratory effort.  No retractions. Lungs CTAB. Gastrointestinal: Soft and nontender. No distention. No abdominal bruits. No CVA tenderness. Musculoskeletal: No obvious deformity or edema to the left hand  wrist or forearm. Patient neurovascular intact. Patient has some moderate guarding palpation first MCP. Neurologic:  Normal speech and language. No gross focal neurologic deficits are appreciated. No gait instability. Skin:  Skin is warm, dry and intact. No rash noted. Psychiatric: Mood and affect are normal. Speech and behavior are normal.  ____________________________________________   LABS (all labs ordered are listed, but only abnormal results are displayed)  Labs Reviewed - No data to display ____________________________________________  EKG   ____________________________________________  RADIOLOGY   ____________________________________________   PROCEDURES  Procedure(s) performed: None  Procedures  Critical Care performed: No  ____________________________________________   INITIAL IMPRESSION / ASSESSMENT AND PLAN / ED COURSE  Pertinent labs & imaging results that were available during my care of the patient were reviewed by me and considered in my medical decision making (see chart for details).  [place] left thumb. Patient given discharge care instructions. Patient prescription for tramadol and naproxen. Patient advised follow-up orthopedics no improvement in one week.  Clinical Course     ____________________________________________   FINAL CLINICAL IMPRESSION(S) / ED DIAGNOSES  Final diagnoses:  Tendinitis of thumb      NEW MEDICATIONS STARTED DURING THIS VISIT:  New Prescriptions   No medications on file     Note:  This document was prepared using Dragon voice recognition software and may include unintentional dictation errors.    Sable Feil, PA-C 07/15/16 [place], MD 07/15/16 [phone]

## 2016-07-15 NOTE — Discharge Instructions (Signed)
Wear splint for 3-5 days. Follow-up orthopedic clinic if no improvement in 3 days.

## 2016-07-17 DIAGNOSIS — M65939 Unspecified synovitis and tenosynovitis, unspecified forearm: Secondary | ICD-10-CM | POA: Insufficient documentation

## 2016-09-08 IMAGING — CT CT BIOPSY
2 series · 13 of 32 positions shown, 19 images · non-contrast
Comparison: none

INDICATION: 40-year-old female with a history of leukocytosis

[Series 2: routine osteo · axial · 0.68mm/px · z∈[+902,+922]mm · 3 of 20 slices shown]
[im 3/20  soft-tissue]
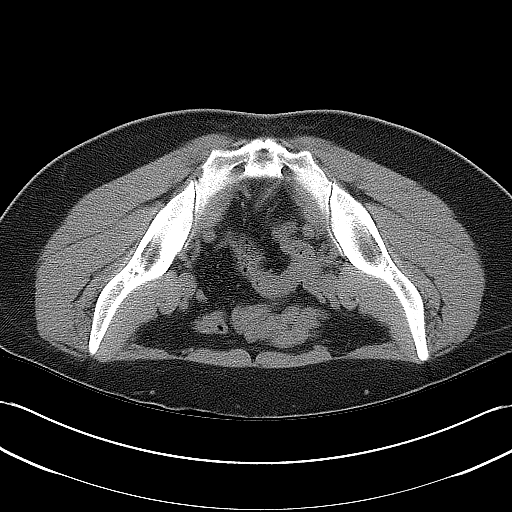
[im 5/20  soft-tissue]
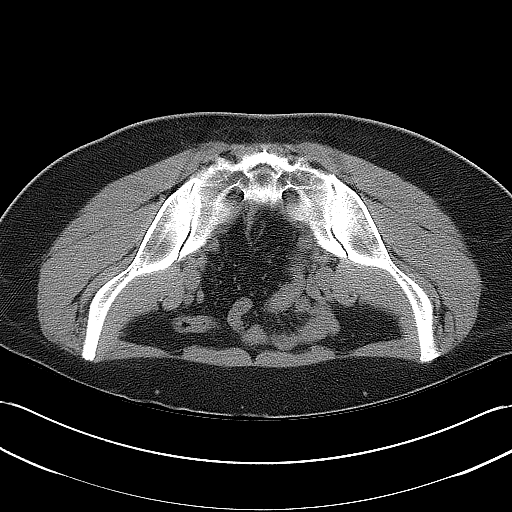
[im 7/20  soft-tissue]
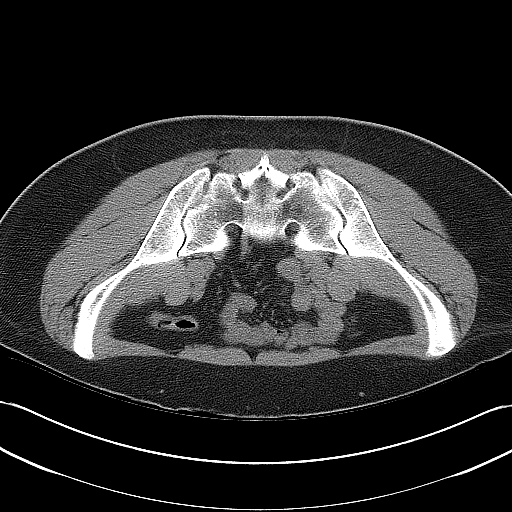

[Series 3: osteo bx 2.4 b70s · axial · 0.74mm/px · z∈[+927,+938]mm · 10 of 12 slices shown, 16 images]
[im 2/12  soft-tissue]
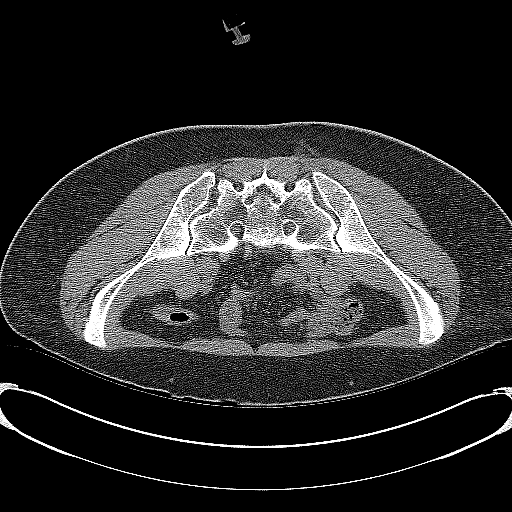
[im 2/12  bone]
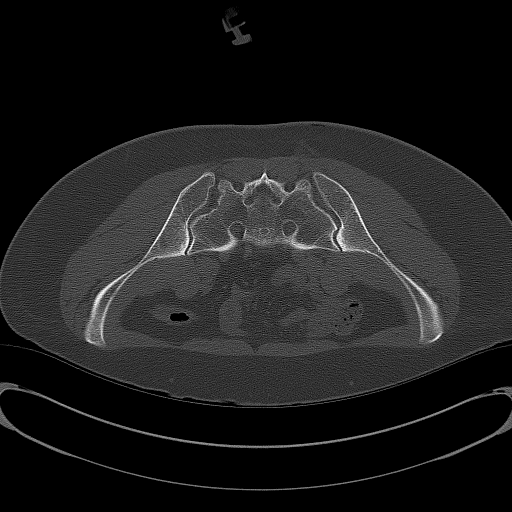
[im 3/12  soft-tissue]
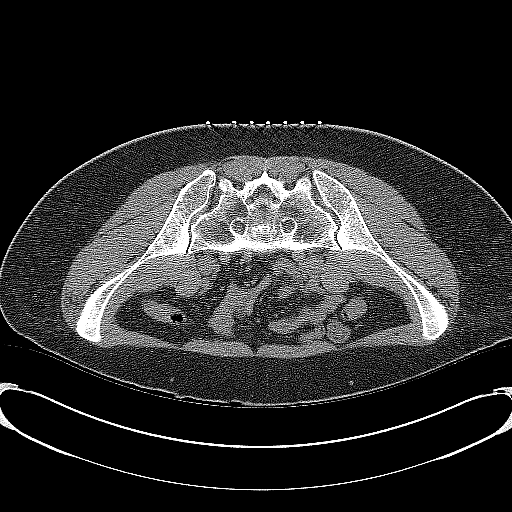
[im 4/12  soft-tissue]
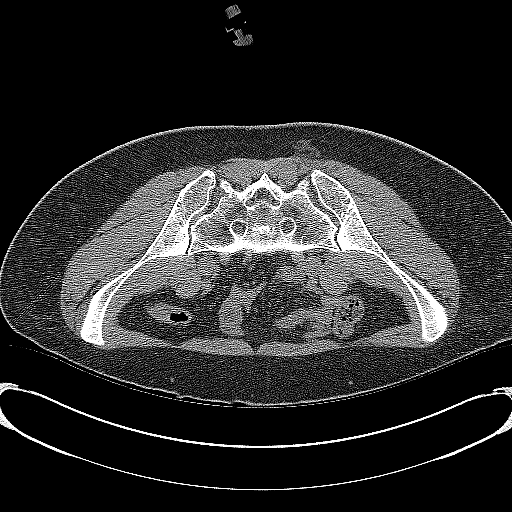
[im 5/12  soft-tissue]
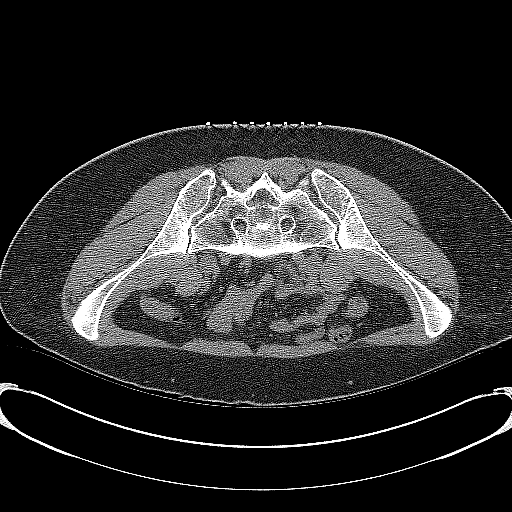
[im 6/12  soft-tissue]
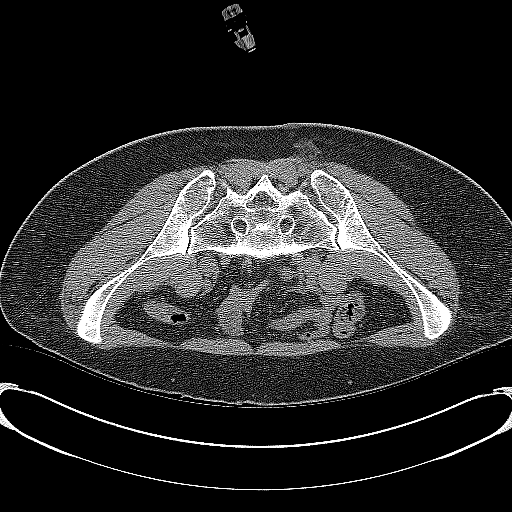
[im 7/12  soft-tissue]
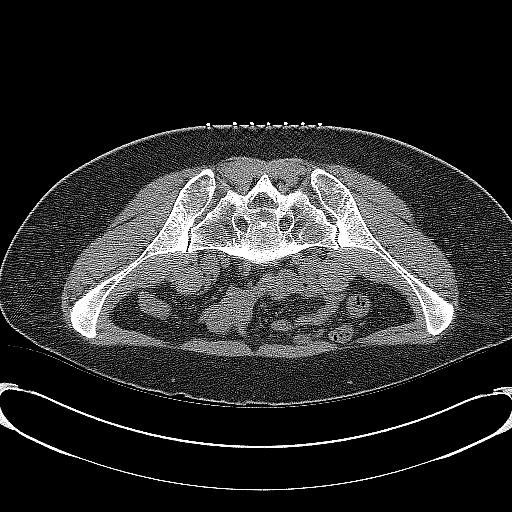
[im 8/12  soft-tissue]
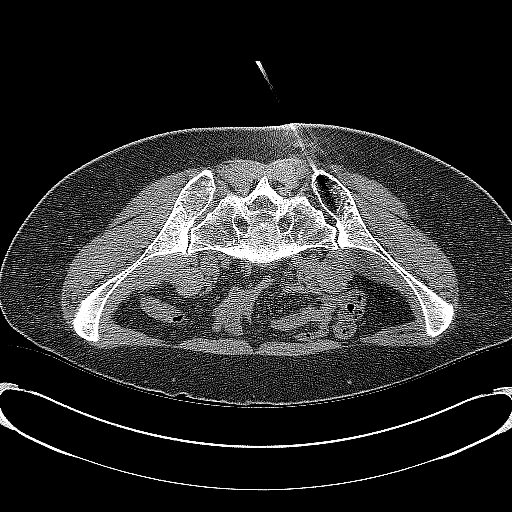
[im 8/12  lung]
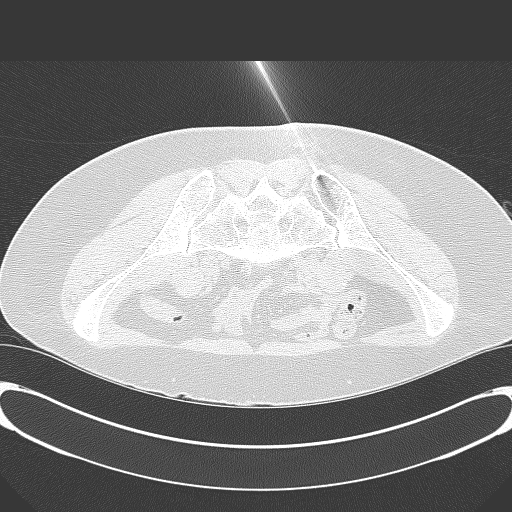
[im 9/12  soft-tissue]
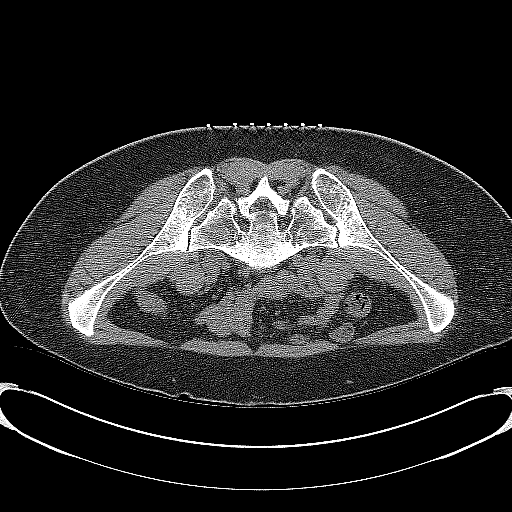
[im 9/12  lung]
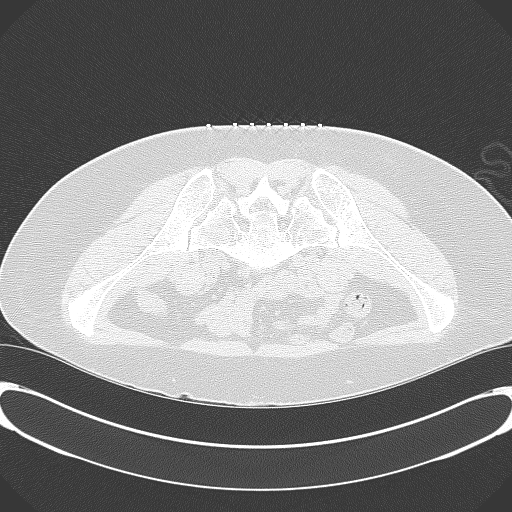
[im 10/12  soft-tissue]
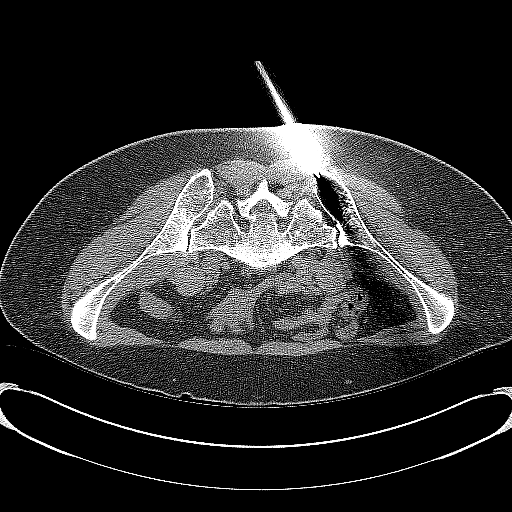
[im 10/12  lung]
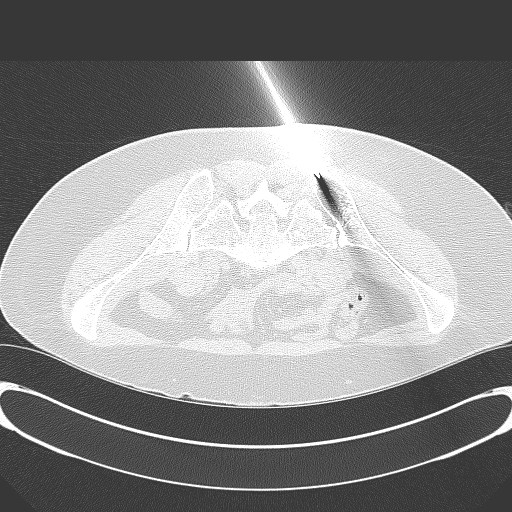
[im 10/12  bone]
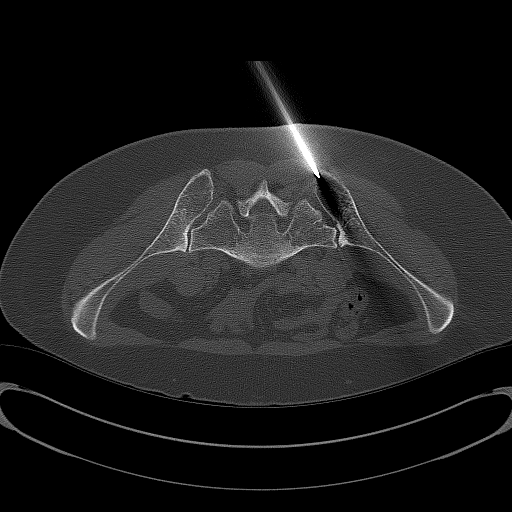
[im 11/12  soft-tissue]
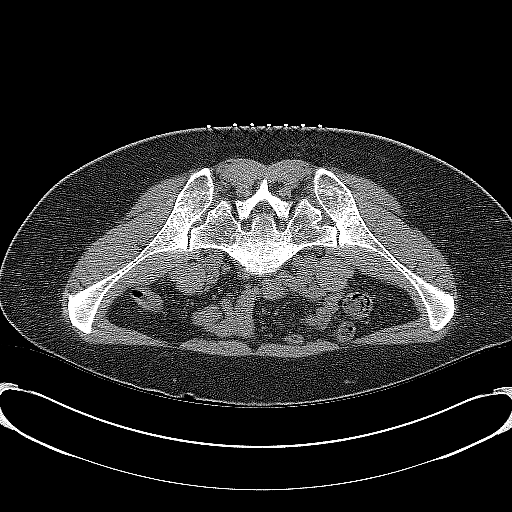
[im 11/12  lung]
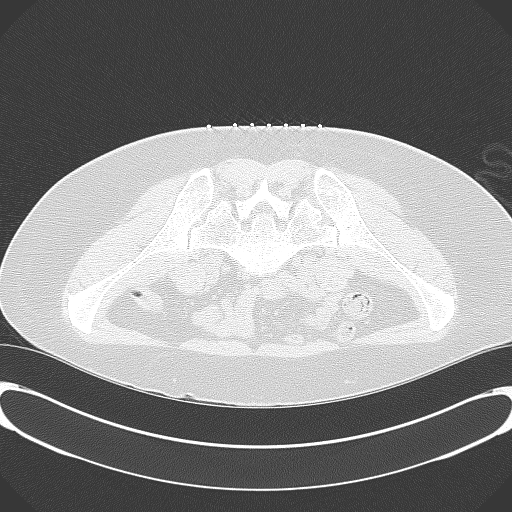

[13 of 32 positions shown; findings below may reference images not displayed]

EXAM:
CT BIOPSY

MEDICATIONS:
None.

ANESTHESIA/SEDATION:
Moderate (conscious) sedation was employed during this procedure. A
total of Versed 3.0 mg and Fentanyl 75 mcg was administered
intravenously.

Moderate Sedation Time: 13 minutes. The patient's level of
consciousness and vital signs were monitored continuously by
radiology nursing throughout the procedure under my direct
supervision.

FLUOROSCOPY TIME:  CT

COMPLICATIONS:
None

PROCEDURE:
The procedure risks, benefits, and alternatives were explained to
the patient. Questions regarding the procedure were encouraged and
answered. The patient understands and consents to the procedure.

Scout CT of the pelvis was performed for surgical planning purposes.

The posterior pelvis was prepped with Betadinein a sterile fashion,
and a sterile drape was applied covering the operative field. A
sterile gown and sterile gloves were used for the procedure. Local
anesthesia was provided with 1% Lidocaine.

We targeted the right posterior iliac bone for biopsy. The skin and
subcutaneous tissues were infiltrated with 1% lidocaine without
epinephrine. A small stab incision was made with an 11 blade
scalpel, and an 11 gauge Adrian Alejandro needle was advanced with CT guidance
to the posterior cortex. Manual forced was used to advance the
needle through the posterior cortex and the stylet was removed. A
bone marrow aspirate was retrieved and passed to a cytotechnologist
in the room. The Adrian Alejandro needle was then advanced without the stylet
for a core biopsy. The core biopsy was retrieved and also passed to
a cytotechnologist.

Manual pressure was used for hemostasis and a sterile dressing was
placed.

No complications were encountered no significant blood loss was
encountered.

Patient tolerated the procedure well and remained hemodynamically
stable throughout.
IMPRESSION: Status post CT-guided bone marrow biopsy, with tissue specimen sent
to pathology for complete histopathologic analysis

## 2016-09-22 ENCOUNTER — Ambulatory Visit
Admission: EM | Admit: 2016-09-22 | Discharge: 2016-09-22 | Disposition: A | Discharge status: Home / Self Care | Payer: Medicaid Other | Attending: Emergency Medicine | Admitting: Emergency Medicine

## 2016-09-22 DIAGNOSIS — J019 Acute sinusitis, unspecified: Secondary | ICD-10-CM | POA: Diagnosis not present

## 2016-09-22 MED ORDER — AMOXICILLIN-POT CLAVULANATE 875-125 MG PO TABS: 1.0000 | ORAL_TABLET | Freq: Two times a day (BID) | ORAL | 0 refills | Status: AC

## 2016-09-22 NOTE — ED Triage Notes (Signed)
Pt c/o sinus infection for about 2 weeks, she has a productive that has yellow phlegm, she has had a fever but hasnt had one the last 48 hours, but lots of pressure and headache.

## 2016-09-22 NOTE — ED Provider Notes (Signed)
CSN: [id number]     Arrival date & time 09/22/16  1517 History   First MD Initiated Contact with Patient 09/22/16 1624     Chief Complaint  Patient presents with  . Nasal Congestion   (Consider location/radiation/quality/duration/timing/severity/associated sxs/prior Treatment) Patient is a fairly healthy 41 year old female, believed that she has a sinus infection. Patient reports nasal congestion of 2 weeks duration with no improvement. Patient also reports fever, ear pain, runny nose, sneezing, sore throat and cough. Patient did not check temperature at home, but reports that she felt very hot, was sweating and had to open her front door to get cool. Patient denies any alleviating or aggravating factors. Patient have tried OTC sinus cold medicine with no improvement.       Past Medical History:  Diagnosis Date  . Leukocytosis    Past Surgical History:  Procedure Laterality Date  . BREAST BIOPSY Right 2016   [place] core  . KNEE CARTILAGE SURGERY Right 02/2015  . LAPAROSCOPIC TUBAL LIGATION     Family History  Problem Relation Age of Onset  . Breast cancer Maternal Grandmother 62   Social History  Substance Use Topics  . Smoking status: Current Every Day Smoker    Packs/day: 1.00    Years: 7.00    Types: Cigarettes  . Smokeless tobacco: Never Used  . Alcohol use No   OB History    No data available     Review of Systems  Constitutional: Positive for fever. Negative for fatigue.  HENT: Positive for congestion, ear pain, rhinorrhea, sneezing and sore throat. Negative for sinus pain and sinus pressure.   Respiratory: Positive for cough. Negative for shortness of breath and wheezing.   Cardiovascular: Negative for chest pain and palpitations.  Gastrointestinal: Negative for abdominal pain, nausea and vomiting.  Skin: Negative for rash.  Neurological: Negative for dizziness and headaches.    Allergies  Patient has no known allergies.  Home Medications   Prior to  Admission medications   Medication Sig Start Date End Date Taking? Authorizing Provider  ibuprofen (ADVIL,MOTRIN) 600 MG tablet Take 1 tablet (600 mg total) by mouth every 8 (eight) hours as needed. 03/19/16  Yes Paulette Blanch, MD  amoxicillin-clavulanate (AUGMENTIN) 875-125 MG tablet Take 1 tablet by mouth 2 (two) times daily. 09/22/16 09/29/16  Barry Dienes, NP   Meds Ordered and Administered this Visit  Medications - No data to display  Ht 5\' 2"  (1.575 m)   Wt 150 lb (68 kg)   LMP 09/15/2016   BMI 27.44 kg/m  No data found.   Physical Exam  Constitutional: She is oriented to person, place, and time. She appears well-developed and well-nourished.  HENT:  Head: Normocephalic and atraumatic.  Right Ear: External ear normal.  Left Ear: External ear normal.  Nose: Nose normal.  Mouth/Throat: Oropharynx is clear and moist. No oropharyngeal exudate.  TMs pearly gray with no erythema. Frontal and maxillary sinuses are very tender to percuss (left frontal and maxillary are worse than right).   Eyes: Conjunctivae are normal. Pupils are equal, round, and reactive to light.  Neck: Normal range of motion. Neck supple.  Cardiovascular: Normal rate, regular rhythm and normal heart sounds.   Pulmonary/Chest: Effort normal and breath sounds normal. No respiratory distress. She has no wheezes.  Abdominal: Soft. Bowel sounds are normal. She exhibits no distension. There is no tenderness.  Lymphadenopathy:    She has no cervical adenopathy.  Neurological: She is alert and oriented to person, place, and  time.  Skin: Skin is warm.  Nursing note and vitals reviewed.   Urgent Care Course   Clinical Course     Procedures (including critical care time)  Labs Review Labs Reviewed - No data to display  Imaging Review No results found.  MDM   1. Acute non-recurrent sinusitis, unspecified location    We'll treat empirically for sinusitis with Augmentin twice a day 7 days. Patient informed to also  get Flonase over-the-counter to do 2 sprays each nostril daily for the next 7 days. Informed to follow up with PCP if no improvement is noted despite treatment today. Discharge instructions given; All questions answered.    Barry Dienes, NP 09/22/16 [phone]

## 2017-05-25 ENCOUNTER — Ambulatory Visit (INDEPENDENT_AMBULATORY_CARE_PROVIDER_SITE_OTHER): Payer: Self-pay

## 2017-05-25 ENCOUNTER — Encounter: Payer: Self-pay | Admitting: Emergency Medicine

## 2017-05-25 ENCOUNTER — Ambulatory Visit
Admission: EM | Admit: 2017-05-25 | Discharge: 2017-05-25 | Disposition: A | Discharge status: Home / Self Care | Payer: Self-pay | Attending: Family Medicine | Admitting: Family Medicine

## 2017-05-25 DIAGNOSIS — S60011A Contusion of right thumb without damage to nail, initial encounter: Secondary | ICD-10-CM

## 2017-05-25 DIAGNOSIS — W231XXA Caught, crushed, jammed, or pinched between stationary objects, initial encounter: Secondary | ICD-10-CM

## 2017-05-25 MED ORDER — HYDROCODONE-ACETAMINOPHEN 5-325 MG PO TABS: ORAL_TABLET | ORAL | 0 refills | Status: DC

## 2017-05-25 NOTE — ED Provider Notes (Signed)
[facility]    CSN: [id number] Arrival date & time: 05/25/17  1633     History   Chief Complaint Chief Complaint  Patient presents with  . Thumb Pain    right    HPI [name] is a 41 y.o. female.   41 yo female with a c/o right thumb pain and swelling after son closed car door on her thumb. Incident occurred about 1 hour ago and patient has been applying ice.    The history is provided by the patient.    Past Medical History:  Diagnosis Date  . Leukocytosis     Patient Active Problem List   Diagnosis Date Noted  . Leucocytosis 03/04/2016    Past Surgical History:  Procedure Laterality Date  . BREAST BIOPSY Right 2016   [place] core  . KNEE CARTILAGE SURGERY Right 02/2015  . LAPAROSCOPIC TUBAL LIGATION      OB History    No data available       Home Medications    Prior to Admission medications   Medication Sig Start Date End Date Taking? Authorizing Provider  FLUoxetine (PROZAC) 20 MG tablet Take 20 mg by mouth daily.   Yes [provider]  HYDROcodone-acetaminophen (NORCO/VICODIN) 5-325 MG tablet 1-2 tabs po q 8 hours prn 05/25/17   Norval Gable, MD  ibuprofen (ADVIL,MOTRIN) 600 MG tablet Take 1 tablet (600 mg total) by mouth every 8 (eight) hours as needed. 03/19/16   Paulette Blanch, MD    Family History Family History  Problem Relation Age of Onset  . Breast cancer Maternal Grandmother 62    Social History Social History  Substance Use Topics  . Smoking status: Current Every Day Smoker    Packs/day: 1.00    Years: 7.00    Types: Cigarettes  . Smokeless tobacco: Never Used  . Alcohol use No     Allergies   Patient has no known allergies.   Review of Systems Review of Systems   Physical Exam Triage Vital Signs ED Triage Vitals  Enc Vitals Group     BP 05/25/17 1653 134/71     Pulse Rate 05/25/17 1653 76     Resp 05/25/17 1653 16     Temp 05/25/17 1653 98.3 F (36.8 C)     Temp Source 05/25/17 1653  Oral     SpO2 05/25/17 1653 99 %     Weight 05/25/17 1651 150 lb (68 kg)     Height 05/25/17 1651 5\' 2"  (1.575 m)     Head Circumference --      Peak Flow --      Pain Score 05/25/17 1651 6     Pain Loc --      Pain Edu? --      Excl. in [place]? --    No data found.   Updated Vital Signs BP 134/71 (BP Location: Left Arm)   Pulse 76   Temp 98.3 F (36.8 C) (Oral)   Resp 16   Ht 5\' 2"  (1.575 m)   Wt 150 lb (68 kg)   LMP 05/24/2017 (Exact Date) Comment: denies preg. tubaligation  SpO2 99%   BMI 27.44 kg/m   Visual Acuity Right Eye Distance:   Left Eye Distance:   Bilateral Distance:    Right Eye Near:   Left Eye Near:    Bilateral Near:     Physical Exam  Constitutional: She appears well-developed and well-nourished. No distress.  Musculoskeletal:  Right hand: She exhibits bony tenderness (over right thumb) and swelling (mild right thumb). Normal sensation noted. Normal strength noted.  Hand/thumb neurovascularly intact  Skin: She is not diaphoretic.  Nursing note and vitals reviewed.    UC Treatments / Results  Labs (all labs ordered are listed, but only abnormal results are displayed) Labs Reviewed - No data to display  EKG  EKG Interpretation None       Radiology Dg Finger Thumb Right  Result Date: 05/25/2017 CLINICAL DATA:  Slammed right thumb in a car door today. EXAM: RIGHT THUMB 2+V COMPARISON:  None. FINDINGS: There is no evidence of fracture or dislocation. There is no evidence of arthropathy or other focal bone abnormality. Soft tissues are unremarkable IMPRESSION: Negative. Electronically Signed   By: Abelardo Diesel M.D.   On: 05/25/2017 17:14    Procedures Procedures (including critical care time)  Medications Ordered in UC Medications - No data to display   Initial Impression / Assessment and Plan / UC Course  I have reviewed the triage vital signs and the nursing notes.  Pertinent labs & imaging results that were available during my  care of the patient were reviewed by me and considered in my medical decision making (see chart for details).       Final Clinical Impressions(s) / UC Diagnoses   Final diagnoses:  Contusion of right thumb without damage to nail, initial encounter    New Prescriptions Discharge Medication List as of 05/25/2017  5:25 PM    START taking these medications   Details  HYDROcodone-acetaminophen (NORCO/VICODIN) 5-325 MG tablet 1-2 tabs po q 8 hours prn, Print       1. x-ray result (negative for fracture)  and diagnosis reviewed with patient 2. rx as per orders above; reviewed possible side effects, interactions, risks and benefits  3. Recommend supportive treatment with rest, ice, elevation, otc analgesics/NSAIDS 4. Follow-up prn if symptoms worsen or don't improve  Controlled Substance Prescriptions [place] Controlled Substance Registry consulted? No   Norval Gable, MD 05/25/17 1743

## 2017-05-25 NOTE — ED Triage Notes (Signed)
Patient states that she slammed her right thumb in the car door today.  Patient c/o pain in her right thumb.

## 2017-05-25 NOTE — Discharge Instructions (Signed)
Rest, ice, elevation °

## 2017-11-27 ENCOUNTER — Ambulatory Visit
Admission: EM | Admit: 2017-11-27 | Discharge: 2017-11-27 | Disposition: A | Discharge status: Home / Self Care | Payer: Self-pay | Attending: Family Medicine | Admitting: Family Medicine

## 2017-11-27 ENCOUNTER — Ambulatory Visit (INDEPENDENT_AMBULATORY_CARE_PROVIDER_SITE_OTHER): Payer: Self-pay

## 2017-11-27 DIAGNOSIS — S8391XA Sprain of unspecified site of right knee, initial encounter: Secondary | ICD-10-CM

## 2017-11-27 DIAGNOSIS — M25561 Pain in right knee: Secondary | ICD-10-CM

## 2017-11-27 MED ORDER — HYDROCODONE-ACETAMINOPHEN 5-325 MG PO TABS: ORAL_TABLET | ORAL | 0 refills | Status: DC

## 2017-11-27 NOTE — ED Triage Notes (Signed)
Pt said she has had surgery on her right knee before so has a hx of knee pain but earlier today she fell twice, once because of her dog and the other time because of some laundry. Said the pain is radiating from the knee down to her ankle. Did not take any otc meds and hurts to drive and being still.

## 2017-11-27 NOTE — ED Provider Notes (Signed)
[facility]    CSN: [id number] Arrival date & time: 11/27/17  1439     History   Chief Complaint Chief Complaint  Patient presents with  . Knee Pain    HPI [name] is a 42 y.o. female.   42 yo female with a c/o right knee pain after falling today twice and hitting her knee. Has not taking any otc analgesics or applied ice. Denies numbness/tingling. Has been able to ambulate.    The history is provided by the patient.  Knee Pain    Past Medical History:  Diagnosis Date  . Leukocytosis     Patient Active Problem List   Diagnosis Date Noted  . Leucocytosis 03/04/2016    Past Surgical History:  Procedure Laterality Date  . BREAST BIOPSY Right 2016   [place] core  . KNEE CARTILAGE SURGERY Right 02/2015  . LAPAROSCOPIC TUBAL LIGATION      OB History    No data available       Home Medications    Prior to Admission medications   Medication Sig Start Date End Date Taking? Authorizing Provider  buPROPion (WELLBUTRIN SR) 150 MG 12 hr tablet Take 150 mg by mouth 2 (two) times daily.   Yes [provider]  FLUoxetine (PROZAC) 20 MG tablet Take 20 mg by mouth daily.    [provider]  HYDROcodone-acetaminophen (NORCO/VICODIN) 5-325 MG tablet 1-2 tabs po bid prn 11/27/17   Norval Gable, MD  ibuprofen (ADVIL,MOTRIN) 600 MG tablet Take 1 tablet (600 mg total) by mouth every 8 (eight) hours as needed. 03/19/16   Paulette Blanch, MD    Family History Family History  Problem Relation Age of Onset  . Breast cancer Maternal Grandmother 68    Social History Social History   Tobacco Use  . Smoking status: Current Every Day Smoker    Packs/day: 1.00    Years: 7.00    Pack years: 7.00    Types: Cigarettes  . Smokeless tobacco: Never Used  Substance Use Topics  . Alcohol use: No    Alcohol/week: 0.0 oz  . Drug use: No     Allergies   Patient has no known allergies.   Review of Systems Review of Systems   Physical  Exam Triage Vital Signs ED Triage Vitals  Enc Vitals Group     BP 11/27/17 1544 119/88     Pulse Rate 11/27/17 1544 (!) 119     Resp 11/27/17 1544 18     Temp 11/27/17 1544 99 F (37.2 C)     Temp Source 11/27/17 1544 Oral     SpO2 11/27/17 1544 98 %     Weight --      Height --      Head Circumference --      Peak Flow --      Pain Score 11/27/17 1545 6     Pain Loc --      Pain Edu? --      Excl. in [place]? --    No data found.  Updated Vital Signs BP 119/88 (BP Location: Left Arm)   Pulse (!) 119   Temp 99 F (37.2 C) (Oral)   Resp 18   LMP 11/22/2017 (Exact Date)   SpO2 98%   Visual Acuity Right Eye Distance:   Left Eye Distance:   Bilateral Distance:    Right Eye Near:   Left Eye Near:    Bilateral Near:     Physical Exam  Constitutional: She appears well-developed and well-nourished. No distress.  Musculoskeletal:       Right knee: She exhibits swelling (mild) and bony tenderness. She exhibits normal range of motion, no effusion, no ecchymosis, no deformity, no laceration, no erythema, normal alignment, no LCL laxity, normal patellar mobility, normal meniscus and no MCL laxity. Tenderness found. Medial joint line and MCL tenderness noted.  Skin: She is not diaphoretic.  Nursing note and vitals reviewed.    UC Treatments / Results  Labs (all labs ordered are listed, but only abnormal results are displayed) Labs Reviewed - No data to display  EKG  EKG Interpretation None       Radiology Dg Knee Complete 4 Views Right  Result Date: 11/27/2017 CLINICAL DATA:  42 y/o F; diffuse right knee pain after twisting injury. EXAM: RIGHT KNEE - COMPLETE 4+ VIEW COMPARISON:  11/25/2014 right knee radiographs FINDINGS: No evidence of fracture, dislocation, or joint effusion. Mild medial femorotibial compartment joint space narrowing and patellofemoral compartment osteophytosis. IMPRESSION: 1. No acute fracture or dislocation identified. 2. 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 Electronically Signed   By: Kristine Garbe M.D.   On: 11/27/2017 16:32    Procedures Procedures (including critical care time)  Medications Ordered in UC Medications - No data to display   Initial Impression / Assessment and Plan / UC Course  I have reviewed the triage vital signs and the nursing notes.  Pertinent labs & imaging results that were available during my care of the patient were reviewed by me and considered in my medical decision making (see chart for details).       Final Clinical Impressions(s) / UC Diagnoses   Final diagnoses:  Sprain of right knee, unspecified ligament, initial encounter    ED Discharge Orders        Ordered    HYDROcodone-acetaminophen (NORCO/VICODIN) 5-325 MG tablet     11/27/17 1642     1. x-ray results and diagnosis reviewed with patient 2. rx as per orders above; reviewed possible side effects, interactions, risks and benefits  3. Recommend supportive treatment with rest, ice, elevation 4. Follow-up prn if symptoms worsen or don't improve  Controlled Substance Prescriptions [place] Controlled Substance Registry consulted? Not Applicable   Norval Gable, MD 11/27/17 [phone]

## 2018-01-01 ENCOUNTER — Encounter: Payer: Self-pay | Admitting: Emergency Medicine

## 2018-01-01 ENCOUNTER — Other Ambulatory Visit: Payer: Self-pay

## 2018-01-01 ENCOUNTER — Emergency Department
Admission: EM | Admit: 2018-01-01 | Discharge: 2018-01-01 | Disposition: A | Discharge status: Home / Self Care | Payer: Self-pay | Attending: Emergency Medicine | Admitting: Emergency Medicine

## 2018-01-01 DIAGNOSIS — Z5321 Procedure and treatment not carried out due to patient leaving prior to being seen by health care provider: Secondary | ICD-10-CM | POA: Insufficient documentation

## 2018-01-01 DIAGNOSIS — Z87898 Personal history of other specified conditions: Secondary | ICD-10-CM | POA: Insufficient documentation

## 2018-01-01 DIAGNOSIS — M79602 Pain in left arm: Secondary | ICD-10-CM | POA: Insufficient documentation

## 2018-01-01 DIAGNOSIS — I8393 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: Secondary | ICD-10-CM | POA: Insufficient documentation

## 2018-01-01 DIAGNOSIS — M25562 Pain in left knee: Secondary | ICD-10-CM | POA: Insufficient documentation

## 2018-01-01 NOTE — ED Notes (Signed)
Called to room pt at 0710, with no response.

## 2018-01-01 NOTE — ED Triage Notes (Addendum)
Pt to STAT desk with c/o left arm and left knee pain after she fell yesterday. Pt states she was walking and tripped over her dog. Denies hitting her head. ambulatory to triage with steady gait. No distress noted. No swelling or obvious deformity. Pt moving arm without difficulty and states "it just feels bruised" pointing to arm just above her elbow.

## 2018-01-27 ENCOUNTER — Ambulatory Visit
Admission: RE | Admit: 2018-01-27 | Discharge: 2018-01-27 | Disposition: A | Discharge status: Home / Self Care | Payer: Self-pay | Source: Ambulatory Visit | Attending: Oncology | Admitting: Oncology

## 2018-01-27 ENCOUNTER — Ambulatory Visit: Payer: Self-pay | Attending: Oncology | Admitting: *Deleted

## 2018-01-27 VITALS — BP 139/84 | HR 80 | Temp 97.4°F | Resp 18 | Ht 62.0 in | Wt 152.9 lb

## 2018-01-27 DIAGNOSIS — N63 Unspecified lump in unspecified breast: Secondary | ICD-10-CM

## 2018-01-27 DIAGNOSIS — Z0000 Encounter for general adult medical examination without abnormal findings: Secondary | ICD-10-CM

## 2018-01-27 NOTE — Patient Instructions (Addendum)
HPV Test The human papillomavirus (HPV) test is used to look for high-risk types of HPV infection. HPV is a group of about 100 viruses. Many of these viruses cause growths on, in, or around the genitals. Most HPV viruses cause infections that usually go away without treatment. However, HPV types 6, 11, 16, and 18 are considered high-risk types of HPV that can increase your risk of cancer of the cervix or anus if the infection is left untreated. An HPV test identifies the DNA (genetic) strands of the HPV infection, so it is also referred to as the HPV DNA test. Although HPV is found in both males and females, the HPV test is only used to screen for increased cancer risk in females:  With an abnormal Pap test.  After treatment of an abnormal Pap test.  Between the ages of 30 and 65.  After treatment of a high-risk HPV infection.  The HPV test may be done at the same time as a pelvic exam and Pap test in females over the age of 30. Both the HPV test and Pap test require a sample of cells from the cervix. How do I prepare for this test?  Do not douche or take a bath for 24-48 hours before the test or as directed by your health care provider.  Do not have sex for 24-48 hours before the test or as directed by your health care provider.  You may be asked to reschedule the test if you are menstruating.  You will be asked to urinate before the test. What do the results mean? It is your responsibility to obtain your test results. Ask the lab or department performing the test when and how you will get your results. Talk with your health care provider if you have any questions about your results. Your result will be negative or positive. Meaning of Negative Test Results A negative HPV test result means that no HPV was found, and it is very likely that you do not have HPV. Meaning of Positive Test Results A positive HPV test result indicates that you have HPV.  If your test result shows the presence  of any high-risk HPV strains, you may have an increased risk of developing cancer of the cervix or anus if the infection is left untreated.  If any low-risk HPV strains are found, you are not likely to have an increased risk of cancer.  Discuss your test results with your health care provider. He or she will use the results to make a diagnosis and determine a treatment plan that is right for you. Talk with your health care provider to discuss your results, treatment options, and if necessary, the need for more tests. Talk with your health care provider if you have any questions about your results. This information is not intended to replace advice given to you by your health care provider. Make sure you discuss any questions you have with your health care provider. Document Released: 10/03/2004 Document Revised: 05/14/2016 Document Reviewed: 01/24/2014 Elsevier Interactive Patient Education  2018 Elsevier Inc.   Gave patient hand-out, Women Staying Healthy, Active and Well from [facility], with education on breast health, pap smears, heart and colon health.  

## 2018-01-27 NOTE — Progress Notes (Signed)
  Subjective:     Patient ID: [name], female   DOB: 27-May-1976, 42 y.o.   MRN: [id number]  HPI   Review of Systems     Objective:   Physical Exam  Pulmonary/Chest: Right breast exhibits tenderness. Right breast exhibits no inverted nipple, no mass, no nipple discharge and no skin change. Left breast exhibits no inverted nipple, no mass, no nipple discharge, no skin change and no tenderness. Breasts are asymmetrical.    Abdominal: There is no splenomegaly or hepatomegaly.  Genitourinary: No labial fusion. There is no rash, tenderness, lesion or injury on the right labia. There is no rash, tenderness, lesion or injury on the left labia. Cervix exhibits discharge and friability. Right adnexum displays no mass, no tenderness and no fullness. Left adnexum displays no mass, no tenderness and no fullness. No erythema, tenderness or bleeding in the vagina. No foreign body in the vagina. No signs of injury around the vagina. No vaginal discharge found.    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  Her husband was present during the interview and exam. Clinical breast exam unremarkable.  Patient states occasional right tenderness.  She cannot associate it with her menes.  States she does drink a lot of coffee.  Encouraged patient to decrease caffeine intake.  Taught self breast awareness.  Specimen collected for pap smear.  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  Patient has been screened for eligibility.  She does not have any insurance, Medicare or Medicaid.  She also meets financial eligibility.  Hand-out given on the Affordable Care Act.    Plan:     Bilateral diagnostic mammogram and ultrasound ordered for follow up of a birads 3 mammogram in 2017.  Specimen of pap smear sent to  the lab.  Will follow-up per [facility] protocol.  If no findings on pap smear will refer for further evaluation of the leukoplakia with gyn.

## 2018-01-30 LAB — PAP LB AND HPV HIGH-RISK
HPV, HIGH-RISK: POSITIVE — AB
PAP Smear Comment: 0

## 2018-02-05 ENCOUNTER — Encounter: Payer: Self-pay | Admitting: *Deleted

## 2018-02-05 NOTE — Progress Notes (Signed)
Talked to patient today and informed her of her normal mammogram, and HPV+ pap showing Trichomonas.  I have called in a prescription for Metronidazole 2 grams by mouth once to [facility] on [address].  Mailed letter with next appointment for pap and mammogram on 02/02/19 @ 9:00.  [facility] to [place].

## 2018-02-24 DIAGNOSIS — M25561 Pain in right knee: Secondary | ICD-10-CM | POA: Insufficient documentation

## 2018-03-23 ENCOUNTER — Emergency Department: Payer: Medicaid Other

## 2018-03-23 ENCOUNTER — Other Ambulatory Visit: Payer: Self-pay

## 2018-03-23 ENCOUNTER — Encounter: Payer: Self-pay | Admitting: Emergency Medicine

## 2018-03-23 ENCOUNTER — Inpatient Hospital Stay
Admission: EM | Admit: 2018-03-23 | Discharge: 2018-03-25 | DRG: 378 | Disposition: A | Discharge status: Home / Self Care | Payer: Medicaid Other | Attending: Family Medicine | Admitting: Family Medicine

## 2018-03-23 DIAGNOSIS — K922 Gastrointestinal hemorrhage, unspecified: Secondary | ICD-10-CM | POA: Diagnosis present

## 2018-03-23 DIAGNOSIS — Z8042 Family history of malignant neoplasm of prostate: Secondary | ICD-10-CM

## 2018-03-23 DIAGNOSIS — K219 Gastro-esophageal reflux disease without esophagitis: Secondary | ICD-10-CM | POA: Diagnosis present

## 2018-03-23 DIAGNOSIS — K644 Residual hemorrhoidal skin tags: Secondary | ICD-10-CM | POA: Diagnosis present

## 2018-03-23 DIAGNOSIS — M25569 Pain in unspecified knee: Secondary | ICD-10-CM | POA: Diagnosis present

## 2018-03-23 DIAGNOSIS — K625 Hemorrhage of anus and rectum: Secondary | ICD-10-CM

## 2018-03-23 DIAGNOSIS — T39395A Adverse effect of other nonsteroidal anti-inflammatory drugs [NSAID], initial encounter: Secondary | ICD-10-CM | POA: Diagnosis present

## 2018-03-23 DIAGNOSIS — F329 Major depressive disorder, single episode, unspecified: Secondary | ICD-10-CM | POA: Diagnosis present

## 2018-03-23 DIAGNOSIS — D72829 Elevated white blood cell count, unspecified: Secondary | ICD-10-CM | POA: Diagnosis present

## 2018-03-23 DIAGNOSIS — K640 First degree hemorrhoids: Secondary | ICD-10-CM | POA: Diagnosis present

## 2018-03-23 DIAGNOSIS — K921 Melena: Secondary | ICD-10-CM | POA: Diagnosis not present

## 2018-03-23 DIAGNOSIS — Z803 Family history of malignant neoplasm of breast: Secondary | ICD-10-CM | POA: Diagnosis not present

## 2018-03-23 DIAGNOSIS — F17210 Nicotine dependence, cigarettes, uncomplicated: Secondary | ICD-10-CM | POA: Diagnosis present

## 2018-03-23 DIAGNOSIS — Q447 Other congenital malformations of liver: Secondary | ICD-10-CM | POA: Diagnosis not present

## 2018-03-23 HISTORY — DX: Depression, unspecified: F32.A

## 2018-03-23 HISTORY — DX: Tobacco use: Z72.0

## 2018-03-23 HISTORY — DX: Major depressive disorder, single episode, unspecified: F32.9

## 2018-03-23 HISTORY — DX: Other specified diseases of liver: K76.89

## 2018-03-23 LAB — TYPE AND SCREEN
ABO/RH(D): A POS
Antibody Screen: NEGATIVE

## 2018-03-23 LAB — CBC
HCT: 43.5 % (ref 35.0–47.0)
Hemoglobin: 14.9 g/dL (ref 12.0–16.0)
MCH: 31.4 pg (ref 26.0–34.0)
MCHC: 34.3 g/dL (ref 32.0–36.0)
MCV: 91.7 fL (ref 80.0–100.0)
PLATELETS: 472 10*3/uL — AB (ref 150–440)
RBC: 4.74 MIL/uL (ref 3.80–5.20)
RDW: 13.3 % (ref 11.5–14.5)
WBC: 17.5 10*3/uL — ABNORMAL HIGH (ref 3.6–11.0)

## 2018-03-23 LAB — COMPREHENSIVE METABOLIC PANEL
ALBUMIN: 5.2 g/dL — AB (ref 3.5–5.0)
ALT: 20 U/L (ref 0–44)
AST: 22 U/L (ref 15–41)
Alkaline Phosphatase: 73 U/L (ref 38–126)
Anion gap: 9 (ref 5–15)
BILIRUBIN TOTAL: 1.7 mg/dL — AB (ref 0.3–1.2)
BUN: 7 mg/dL (ref 6–20)
CHLORIDE: 103 mmol/L (ref 98–111)
CO2: 26 mmol/L (ref 22–32)
CREATININE: 0.52 mg/dL (ref 0.44–1.00)
Calcium: 9.7 mg/dL (ref 8.9–10.3)
GFR calc Af Amer: >60 mL/min (ref >60)
GLUCOSE: 86 mg/dL (ref 70–99)
Potassium: 3.8 mmol/L (ref 3.5–5.1)
Sodium: 138 mmol/L (ref 135–145)
Total Protein: 8.7 g/dL — ABNORMAL HIGH (ref 6.5–8.1)

## 2018-03-23 LAB — TROPONIN I: Troponin I: <0.03 ng/mL (ref <0.03)

## 2018-03-23 LAB — HEMOGLOBIN: Hemoglobin: 13.9 g/dL (ref 12.0–16.0)

## 2018-03-23 LAB — POCT PREGNANCY, URINE: Preg Test, Ur: NEGATIVE

## 2018-03-23 MED ORDER — NICOTINE 21 MG/24HR TD PT24
21.0000 mg | MEDICATED_PATCH | Freq: Every day | TRANSDERMAL | Status: DC
  Administered 2018-03-23 – 2018-03-25 (×3): 21 mg via TRANSDERMAL
  Filled 2018-03-23 (×4): qty 1

## 2018-03-23 MED ORDER — SODIUM CHLORIDE 0.9 % IV SOLN
INTRAVENOUS | Status: DC
  Administered 2018-03-23 – 2018-03-25 (×3): via INTRAVENOUS

## 2018-03-23 MED ORDER — FAMOTIDINE IN NACL 20-0.9 MG/50ML-% IV SOLN
INTRAVENOUS | Status: AC
  Administered 2018-03-23: 20 mg via INTRAVENOUS
  Filled 2018-03-23: qty 50

## 2018-03-23 MED ORDER — GI COCKTAIL ~~LOC~~
30.0000 mL | Freq: Once | ORAL | Status: AC
  Administered 2018-03-23: 30 mL via ORAL
  Filled 2018-03-23: qty 30

## 2018-03-23 MED ORDER — IOHEXOL 300 MG/ML  SOLN
100.0000 mL | Freq: Once | INTRAMUSCULAR | Status: AC | PRN
  Administered 2018-03-23: 100 mL via INTRAVENOUS

## 2018-03-23 MED ORDER — SODIUM CHLORIDE 0.9 % IV BOLUS
1000.0000 mL | Freq: Once | INTRAVENOUS | Status: AC
  Administered 2018-03-23: 1000 mL via INTRAVENOUS

## 2018-03-23 MED ORDER — ACETAMINOPHEN 325 MG PO TABS: 650.0000 mg | ORAL_TABLET | Freq: Four times a day (QID) | ORAL | Status: DC | PRN

## 2018-03-23 MED ORDER — ONDANSETRON HCL 4 MG/2ML IJ SOLN
4.0000 mg | Freq: Once | INTRAMUSCULAR | Status: AC
  Administered 2018-03-23: 4 mg via INTRAVENOUS
  Filled 2018-03-23: qty 2

## 2018-03-23 MED ORDER — ONDANSETRON HCL 4 MG PO TABS: 4.0000 mg | ORAL_TABLET | Freq: Four times a day (QID) | ORAL | Status: DC | PRN

## 2018-03-23 MED ORDER — ADULT MULTIVITAMIN W/MINERALS CH
1.0000 | ORAL_TABLET | Freq: Every day | ORAL | Status: DC
  Administered 2018-03-23 – 2018-03-24 (×2): 1 via ORAL
  Filled 2018-03-23 (×2): qty 1

## 2018-03-23 MED ORDER — OMEGA-3-ACID ETHYL ESTERS 1 G PO CAPS
1.0000 g | ORAL_CAPSULE | Freq: Every day | ORAL | Status: DC
  Administered 2018-03-23 – 2018-03-24 (×2): 1 g via ORAL
  Filled 2018-03-23 (×2): qty 1

## 2018-03-23 MED ORDER — FLUOXETINE HCL 20 MG PO CAPS
40.0000 mg | ORAL_CAPSULE | Freq: Every day | ORAL | Status: DC
  Filled 2018-03-23 (×2): qty 2

## 2018-03-23 MED ORDER — ONDANSETRON HCL 4 MG/2ML IJ SOLN
4.0000 mg | Freq: Four times a day (QID) | INTRAMUSCULAR | Status: DC | PRN
  Administered 2018-03-24: 4 mg via INTRAVENOUS
  Filled 2018-03-23: qty 2

## 2018-03-23 MED ORDER — FAMOTIDINE IN NACL 20-0.9 MG/50ML-% IV SOLN
20.0000 mg | Freq: Two times a day (BID) | INTRAVENOUS | Status: DC
  Administered 2018-03-23: 20 mg via INTRAVENOUS

## 2018-03-23 MED ORDER — IOPAMIDOL (ISOVUE-300) INJECTION 61%
30.0000 mL | Freq: Once | INTRAVENOUS | Status: AC | PRN
  Administered 2018-03-23: 30 mL via ORAL

## 2018-03-23 MED ORDER — ACETAMINOPHEN 325 MG PO TABS
650.0000 mg | ORAL_TABLET | Freq: Once | ORAL | Status: AC
  Administered 2018-03-23: 650 mg via ORAL
  Filled 2018-03-23: qty 2

## 2018-03-23 MED ORDER — PANTOPRAZOLE SODIUM 40 MG IV SOLR
40.0000 mg | Freq: Two times a day (BID) | INTRAVENOUS | Status: DC
  Administered 2018-03-23 – 2018-03-24 (×3): 40 mg via INTRAVENOUS
  Filled 2018-03-23 (×3): qty 40

## 2018-03-23 MED ORDER — ACETAMINOPHEN 650 MG RE SUPP: 650.0000 mg | Freq: Four times a day (QID) | RECTAL | Status: DC | PRN

## 2018-03-23 NOTE — ED Triage Notes (Signed)
Pt presents to ED via POV with c/o intermittent indigestion and burping and nausea since yesterday. Pt also c/o intermittent rectal bleeding with sudden onset worsening yesterday. Pt reports rectal bleeding is noted when she goes to the bathroom and sometimes she is not able to have a bowel movement just noted bright red blood in the toilet.

## 2018-03-23 NOTE — ED Notes (Signed)
Rachel RN, aware of bed assigned  

## 2018-03-23 NOTE — H&P (Signed)
[name] [place] NAME: [name]    MR#:  [id number]  DATE OF BIRTH:  12/29/1975  DATE OF ADMISSION:  03/23/2018  PRIMARY CARE PHYSICIAN: [name], NP   REQUESTING/REFERRING PHYSICIAN: Dr. Conni Slipper  CHIEF COMPLAINT:   Chief Complaint  Patient presents with  . Gastroesophageal Reflux  . Rectal Bleeding    HISTORY OF PRESENT ILLNESS:  [name]  is a 42 y.o. 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 Patient has had bloody stools in the past which were spontaneously resolved.  Since yesterday she noticed having worsening epigastric pain followed by stool mixed with blood initially.  This morning she has had 2 episodes of bright red blood per rectum and so presented to the emergency room.  Hemoglobin is stable around 14.9.  Complains of nausea but no vomiting.  She has been taking ibuprofen for a long time at least 2-3 times a day.  Has not taken any in the last 2 to 3 days.  No prior EGD or colonoscopy.  Epigastric pain improved with Maalox.  Complains of some headache.  Stool for guaiac was positive here.  PAST MEDICAL HISTORY:   Past Medical History:  Diagnosis Date  . Depression   . 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 Leukocytosis   . Tobacco use     PAST SURGICAL HISTORY:   Past Surgical History:  Procedure Laterality Date  . BREAST BIOPSY Right 2016   [place] core, fat necrosis  . KNEE CARTILAGE SURGERY Right 02/2015  . LAPAROSCOPIC TUBAL LIGATION      SOCIAL HISTORY:   Social History   Tobacco Use  . Smoking status: Current Every Day Smoker    Packs/day: 1.00    Years: 7.00    Pack years: 7.00    Types: Cigarettes  . Smokeless tobacco: Never Used  Substance Use Topics  . Alcohol use: No   Alcohol/week: 0.0 oz    FAMILY HISTORY:   Family History  Problem Relation Age of Onset  . Prostate cancer Father   . Breast cancer Maternal Grandmother 40    DRUG ALLERGIES:  No Known Allergies  REVIEW OF SYSTEMS:   Review of Systems  Constitutional: Positive for malaise/fatigue. Negative for chills, fever and weight loss.  HENT: Negative for ear discharge, ear pain, hearing loss, nosebleeds and tinnitus.   Eyes: Negative for blurred vision, double vision and photophobia.  Respiratory: Negative for cough, hemoptysis, shortness of breath and wheezing.   Cardiovascular: Negative for chest pain, palpitations, orthopnea and leg swelling.  Gastrointestinal: Positive for abdominal pain, blood in stool, heartburn and nausea. Negative for constipation, diarrhea, melena and vomiting.  Genitourinary: Negative for dysuria, frequency, hematuria and urgency.  Musculoskeletal: Negative for back pain, myalgias and neck pain.  Skin: Negative for rash.  Neurological: Positive for headaches. Negative for dizziness, tingling, tremors, sensory change, speech change and focal weakness.  Endo/Heme/Allergies: Does not bruise/bleed easily.  Psychiatric/Behavioral: Negative for depression.    MEDICATIONS AT HOME:   Prior to Admission medications   Medication Sig Start Date End Date Taking? Authorizing Provider  FLUoxetine (PROZAC) 40 MG capsule Take 40 mg by mouth daily.   Yes [provider]  Multiple Vitamins-Minerals (MULTIVITAMIN WITH MINERALS) tablet Take 1 tablet by mouth at bedtime.  Yes [provider]  omega-3 acid ethyl esters (LOVAZA) 1 g capsule Take 1 g by mouth at bedtime.   Yes [provider]      VITAL SIGNS:  Blood pressure 123/79, pulse 75, temperature 98.2 F (36.8 C), temperature source Oral, resp. rate 15, height 5' 2"  (1.575 m), weight 70.8 kg (156 lb), last menstrual period 03/06/2018, SpO2 97 %.  PHYSICAL EXAMINATION:   Physical  Exam  GENERAL:  42 y.o.-year-old patient lying in the bed with no acute distress.  EYES: Pupils equal, round, reactive to light and accommodation. No scleral icterus. Extraocular muscles intact.  HEENT: Head atraumatic, normocephalic. Oropharynx and nasopharynx clear.  NECK:  Supple, no jugular venous distention. No thyroid enlargement, no tenderness.  LUNGS: Normal breath sounds bilaterally, no wheezing, rales,rhonchi or crepitation. No use of accessory muscles of respiration.  CARDIOVASCULAR: S1, S2 normal. No murmurs, rubs, or gallops.  ABDOMEN: Soft, mild tenderness in the epigastric region and also lower abdomen without guarding or rigidity,, nondistended. Bowel sounds present. No organomegaly or mass.  EXTREMITIES: No pedal edema, cyanosis, or clubbing.  NEUROLOGIC: Cranial nerves II through XII are intact. Muscle strength 5/5 in all extremities. Sensation intact. Gait not checked.  PSYCHIATRIC: The patient is alert and oriented x 3.  SKIN: No obvious rash, lesion, or ulcer.   LABORATORY PANEL:   CBC Recent Labs  Lab 03/23/18 1551  WBC 17.5*  HGB 14.9  HCT 43.5  PLT 472*   ------------------------------------------------------------------------------------------------------------------  Chemistries  Recent Labs  Lab 03/23/18 1551  NA 138  K 3.8  CL 103  CO2 26  GLUCOSE 86  BUN 7  CREATININE 0.52  CALCIUM 9.7  AST 22  ALT 20  ALKPHOS 73  BILITOT 1.7*   ------------------------------------------------------------------------------------------------------------------  Cardiac Enzymes Recent Labs  Lab 03/23/18 1551  TROPONINI <0.03   ------------------------------------------------------------------------------------------------------------------  RADIOLOGY:  Ct Abdomen Pelvis W Contrast  Result Date: 03/23/2018 CLINICAL DATA:  Intermittent indigestion, burping and nausea since yesterday, intermittent rectal bleeding suddenly worsening yesterday with bright  red blood EXAM: CT ABDOMEN AND PELVIS WITH CONTRAST TECHNIQUE: Multidetector CT imaging of the abdomen and pelvis was performed using the standard protocol following bolus administration of intravenous contrast. Sagittal and coronal MPR images reconstructed from axial data set. CONTRAST:  138m OMNIPAQUE IOHEXOL 300 MG/ML SOLN IV. Dilute oral contrast. COMPARISON:  01/16/2016 FINDINGS: Lower chest: Lung bases clear Hepatobiliary: Large mildly hypervascular lesion in RIGHT lobe liver 7.0 x 5.6 x 5.4 cm with a small central scar unchanged since previous exam, question focal nodular hyperplasia. The second lesion seen on earlier study the medial aspect of the RIGHT lobe liver is less well demonstrated, approximately 2.0 x 1.4 cm. No additional hepatic mass lesions. Pancreas: Normal appearance Spleen: Normal appearance Adrenals/Urinary Tract: LEFT adrenal mass 17 x 15 mm image 24, previously 11 x 9 mm, of indeterminate attenuation. RIGHT adrenal gland unremarkable. Kidneys, ureters, and bladder normal appearance. Stomach/Bowel: Normal appearing retrocecal appendix. Stomach and bowel loops grossly normal appearance. No definite areas of wall thickening or mass. No colonic diverticula. Vascular/Lymphatic: Vascular structures unremarkable. No adenopathy. Reproductive: Question small leiomyoma 11 mm diameter RIGHT uterus image 67. Small collapsed cyst within RIGHT ovary. LEFT ovarian cyst 3.3 x 2.9 cm. Other: No free air or free fluid. No hernia or acute inflammatory process. Musculoskeletal: Multifactorial mild narrowing of the spinal canal at L4-L5. LEFT paracentral disc protrusion L5-S1 potentially exerting mass effect upon the LEFT lateral recess and LEFT S1 root. IMPRESSION: Again identified large hypervascular  mass RIGHT lobe liver 7.0 x 5.6 x 5.4 cm question focal nodular hyperplasia, less likely cavernous hemangioma. Second lesion seen previously in the RIGHT lobe of the liver is less well demonstrated,  approximately 2.0 x 1.4 cm, similar characteristics. Nonspecific LEFT adrenal mass 17 x 15 mm, increased since 01/16/2016; follow-up CT imaging of the abdomen with and without contrast with adrenal protocol is recommended to characterize and exclude malignancy. Small LEFT ovarian cyst 3.3 cm diameter and collapsed RIGHT ovarian cyst. Small LEFT paracentral disc protrusion at L5-S1 question exerting mass effect upon LEFT lateral recess and LEFT S1 nerve root. Electronically Signed   By: Lavonia Dana M.D.   On: 03/23/2018 17:50    EKG:   Orders placed or performed during the hospital encounter of 03/23/18  . ED EKG  . ED EKG  . EKG 12-Lead  . EKG 12-Lead  . ED EKG  . ED EKG    IMPRESSION AND PLAN:   [name]  is a 42 y.o. 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  1.  GI bleed-could be upper GI especially with use of NSAID's -Admit, hemoglobin is stable.  Check every 8 hours -N.p.o. after midnight.  IV Protonix twice daily -GI consult requested  2.  Benign leukocytosis-baseline around 15 K.  Now increased up to 17k, check urine analysis. -Followed with cancer center, bone marrow biopsy did not indicate any myeloproliferative disorder.  3.  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  Follows with [facility] hepatology.  4.  DVT prophylaxis-teds and SCDs.  No heparin products due to GI bleed  5.  Tobacco use disorder-on nicotine patch.   All the records are reviewed and case discussed with ED provider. Management plans discussed with the patient, family and they are in agreement.  CODE STATUS: Full code  TOTAL TIME TAKING CARE OF THIS PATIENT: 50 minutes.    Gladstone Lighter M.D on 03/23/2018 at 8:07 PM  Between 7am to 6pm - Pager - [phone]  After 6pm go to [email or web address] - password  [facility]  Office  [phone]  CC: Primary care physician; [name], NP

## 2018-03-23 NOTE — ED Provider Notes (Signed)
[facility] Emergency Department Provider Note   ____________________________________________   First MD Initiated Contact with Patient 03/23/18 1555     (approximate)  I have reviewed the triage vital signs and the nursing notes.   HISTORY  Chief Complaint Gastroesophageal Reflux and Rectal Bleeding   HPI [name] is a 42 y.o. female who reports about 2 weeks of bright red blood per rectum.  It started out on a camping trip on June 22 and is now getting worse multiple times a day now.  Today was the worst day so far she felt like she had a stool she had one bowel movement and then several other times she is went to the toilet and had blood coming out with no stool.  She also has abdominal pain left-sided going up to the epigastric area.  Epigastric area is the most painful.  Is worse with palpation.  She tried to eat some yogurt and pudding today and that did not help.  She had a history of gastritis in the past.  Pain is mild to moderate.  Burning in nature.  Patient normally has intermittent diarrhea.  Today she had formed stool.   Past Medical History:  Diagnosis Date  . Depression   . 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 Leukocytosis   . Tobacco use     Patient Active Problem List   Diagnosis Date Noted  . GI bleed 03/23/2018  . Leucocytosis 03/04/2016    Past Surgical History:  Procedure Laterality Date  . BREAST BIOPSY Right 2016   [place] core, fat necrosis  . KNEE CARTILAGE SURGERY Right 02/2015  . LAPAROSCOPIC TUBAL LIGATION      Prior to Admission medications   Medication Sig Start Date End Date Taking? Authorizing Provider  FLUoxetine (PROZAC) 40 MG capsule Take 40 mg by mouth daily.   Yes [provider]  Multiple Vitamins-Minerals (MULTIVITAMIN WITH MINERALS) tablet Take 1 tablet by mouth at bedtime.   Yes [provider]  omega-3 acid ethyl esters (LOVAZA) 1 g capsule Take 1 g by mouth  at bedtime.   Yes [provider]    Allergies Patient has no known allergies.  Family History  Problem Relation Age of Onset  . Prostate cancer Father   . Breast cancer Maternal Grandmother 59    Social History Social History   Tobacco Use  . Smoking status: Current Every Day Smoker    Packs/day: 1.00    Years: 7.00    Pack years: 7.00    Types: Cigarettes  . Smokeless tobacco: Never Used  Substance Use Topics  . Alcohol use: No    Alcohol/week: 0.0 oz  . Drug use: No    Review of Systems  Constitutional: No fever/chills Eyes: No visual changes. ENT: No sore throat. Cardiovascular: Denies chest pain. Respiratory: Denies shortness of breath. Gastrointestinal: See HPI. Genitourinary: Negative for dysuria. Musculoskeletal: Negative for back pain. Skin: Negative for rash. Neurological: Negative for headaches, focal weakness   ____________________________________________   PHYSICAL EXAM:  VITAL SIGNS: ED Triage Vitals  Enc Vitals Group     BP 03/23/18 1545 (!) 141/91     Pulse Rate 03/23/18 1545 (!) 102     Resp 03/23/18 1545 18     Temp 03/23/18 1545 98.2 F (36.8 C)     Temp Source 03/23/18 1545 Oral     SpO2 03/23/18 1545 98 %     Weight 03/23/18 1546 156 lb (70.8  kg)     Height 03/23/18 1546 5\' 2"  (1.575 m)     Head Circumference --      Peak Flow --      Pain Score 03/23/18 1546 0     Pain Loc --      Pain Edu? --      Excl. in [place]? --     Constitutional: Alert and oriented. Well appearing and in no acute distress. Eyes: Conjunctivae are normal. PERRL. EOMI. Head: Atraumatic. Nose: No congestion/rhinnorhea. Mouth/Throat: Mucous membranes are moist.  Oropharynx non-erythematous. Neck: No stridor.  Cardiovascular: Normal rate, regular rhythm. Grossly normal heart sounds.  Good peripheral circulation. Respiratory: Normal respiratory effort.  No retractions. Lungs CTAB. Gastrointestinal: Soft tender in the epigastric area and left upper  quadrant somewhat tender along the course of the descending and sigmoid colon as well.  No distention. No abdominal bruits.  Mild diffuse back tenderness over the paraspinous muscles no spinal tenderness Rectal: Old hemorrhoidal tags are present.  There are no masses that I can feel.  Rectal exam is slightly tender and there is a smear of blood mixed with stool on my finger when I withdraw it.   Musculoskeletal: No lower extremity tenderness nor edema.  No joint effusions. Neurologic:  Normal speech and language. No gross focal neurologic deficits are appreciated. No gait instability. Skin:  Skin is warm, dry and intact. No rash noted. Psychiatric: Mood and affect are normal. Speech and behavior are normal.  ____________________________________________   LABS (all labs ordered are listed, but only abnormal results are displayed)  Labs Reviewed  COMPREHENSIVE METABOLIC PANEL - Abnormal; Notable for the following components:      Result Value   Total Protein 8.7 (*)    Albumin 5.2 (*)    Total Bilirubin 1.7 (*)    All other components within normal limits  CBC - Abnormal; Notable for the following components:   WBC 17.5 (*)    Platelets 472 (*)    All other components within normal limits  TROPONIN I  HEMOGLOBIN  URINALYSIS, COMPLETE (UACMP) WITH MICROSCOPIC  HIV ANTIBODY (ROUTINE TESTING)  BASIC METABOLIC PANEL  CBC  POC OCCULT BLOOD, ED  POC URINE PREG, ED  POCT PREGNANCY, URINE  TYPE AND SCREEN   ____________________________________________  EKG  EKG read and interpreted by me shows normal sinus rhythm rate of 94 normal axis no acute ST-T changes ____________________________________________  RADIOLOGY  ED MD interpretation:    Official radiology report(s): Ct Abdomen Pelvis W Contrast  Result Date: 03/23/2018 CLINICAL DATA:  Intermittent indigestion, burping and nausea since yesterday, intermittent rectal bleeding suddenly worsening yesterday with bright red blood  EXAM: CT ABDOMEN AND PELVIS WITH CONTRAST TECHNIQUE: Multidetector CT imaging of the abdomen and pelvis was performed using the standard protocol following bolus administration of intravenous contrast. Sagittal and coronal MPR images reconstructed from axial data set. CONTRAST:  17mL OMNIPAQUE IOHEXOL 300 MG/ML SOLN IV. Dilute oral contrast. COMPARISON:  01/16/2016 FINDINGS: Lower chest: Lung bases clear Hepatobiliary: Large mildly hypervascular lesion in RIGHT lobe liver 7.0 x 5.6 x 5.4 cm with a small central scar unchanged since previous exam, question focal nodular hyperplasia. The second lesion seen on earlier study the medial aspect of the RIGHT lobe liver is less well demonstrated, approximately 2.0 x 1.4 cm. No additional hepatic mass lesions. Pancreas: Normal appearance Spleen: Normal appearance Adrenals/Urinary Tract: LEFT adrenal mass 17 x 15 mm image 24, previously 11 x 9 mm, of indeterminate attenuation. RIGHT adrenal gland unremarkable.  Kidneys, ureters, and bladder normal appearance. Stomach/Bowel: Normal appearing retrocecal appendix. Stomach and bowel loops grossly normal appearance. No definite areas of wall thickening or mass. No colonic diverticula. Vascular/Lymphatic: Vascular structures unremarkable. No adenopathy. Reproductive: Question small leiomyoma 11 mm diameter RIGHT uterus image 67. Small collapsed cyst within RIGHT ovary. LEFT ovarian cyst 3.3 x 2.9 cm. Other: No free air or free fluid. No hernia or acute inflammatory process. Musculoskeletal: Multifactorial mild narrowing of the spinal canal at L4-L5. LEFT paracentral disc protrusion L5-S1 potentially exerting mass effect upon the LEFT lateral recess and LEFT S1 root. IMPRESSION: Again identified large hypervascular mass RIGHT lobe liver 7.0 x 5.6 x 5.4 cm question focal nodular hyperplasia, less likely cavernous hemangioma. Second lesion seen previously in the RIGHT lobe of the liver is less well demonstrated, approximately 2.0 x  1.4 cm, similar characteristics. Nonspecific LEFT adrenal mass 17 x 15 mm, increased since 01/16/2016; follow-up CT imaging of the abdomen with and without contrast with adrenal protocol is recommended to characterize and exclude malignancy. Small LEFT ovarian cyst 3.3 cm diameter and collapsed RIGHT ovarian cyst. Small LEFT paracentral disc protrusion at L5-S1 question exerting mass effect upon LEFT lateral recess and LEFT S1 nerve root. Electronically Signed   By: Lavonia Dana M.D.   On: 03/23/2018 17:50    ____________________________________________   PROCEDURES  Procedure(s) performed:   Procedures  Cr  ____________________________________________   INITIAL IMPRESSION / ASSESSMENT AND PLAN / ED COURSE  Old records reviewed patient had seen hematology oncology for her leukocytosis.  This was felt to be due to her smoking.  White count in the past ran from 14-15,000 during most of her visits although at one point 4 years ago was up to 19,000.  Her H&H appears to be stable.        ____________________________________________   FINAL CLINICAL IMPRESSION(S) / ED DIAGNOSES  Final diagnoses:  Rectal bleeding     ED Discharge Orders    None       Note:  This document was prepared using Dragon voice recognition software and may include unintentional dictation errors.    Nena Polio, MD 03/24/18 [phone]

## 2018-03-24 DIAGNOSIS — R109 Unspecified abdominal pain: Secondary | ICD-10-CM

## 2018-03-24 DIAGNOSIS — K922 Gastrointestinal hemorrhage, unspecified: Secondary | ICD-10-CM

## 2018-03-24 DIAGNOSIS — Z791 Long term (current) use of non-steroidal anti-inflammatories (NSAID): Secondary | ICD-10-CM

## 2018-03-24 LAB — BASIC METABOLIC PANEL
ANION GAP: 7 (ref 5–15)
BUN: 6 mg/dL (ref 6–20)
CHLORIDE: 107 mmol/L (ref 98–111)
CO2: 26 mmol/L (ref 22–32)
Calcium: 8.5 mg/dL — ABNORMAL LOW (ref 8.9–10.3)
Creatinine, Ser: 0.52 mg/dL (ref 0.44–1.00)
GFR calc Af Amer: >60 mL/min (ref >60)
Glucose, Bld: 85 mg/dL (ref 70–99)
POTASSIUM: 3.7 mmol/L (ref 3.5–5.1)
SODIUM: 140 mmol/L (ref 135–145)

## 2018-03-24 LAB — CBC
HEMATOCRIT: 41.1 % (ref 35.0–47.0)
HEMOGLOBIN: 14.1 g/dL (ref 12.0–16.0)
MCH: 31.7 pg (ref 26.0–34.0)
MCHC: 34.4 g/dL (ref 32.0–36.0)
MCV: 92.1 fL (ref 80.0–100.0)
Platelets: 409 10*3/uL (ref 150–440)
RBC: 4.46 MIL/uL (ref 3.80–5.20)
RDW: 13.3 % (ref 11.5–14.5)
WBC: 12.8 10*3/uL — AB (ref 3.6–11.0)

## 2018-03-24 LAB — URINALYSIS, COMPLETE (UACMP) WITH MICROSCOPIC
BILIRUBIN URINE: NEGATIVE
Bacteria, UA: NONE SEEN
GLUCOSE, UA: NEGATIVE mg/dL
Hgb urine dipstick: NEGATIVE
KETONES UR: NEGATIVE mg/dL
Leukocytes, UA: NEGATIVE
NITRITE: NEGATIVE
PH: 6 (ref 5.0–8.0)
Protein, ur: NEGATIVE mg/dL
SPECIFIC GRAVITY, URINE: 1.014 (ref 1.005–1.030)

## 2018-03-24 MED ORDER — PEG 3350-KCL-NA BICARB-NACL 420 G PO SOLR
4000.0000 mL | Freq: Once | ORAL | Status: AC
  Administered 2018-03-24: 4000 mL via ORAL
  Filled 2018-03-24: qty 4000

## 2018-03-24 MED ORDER — SODIUM CHLORIDE 0.9 % IV SOLN
INTRAVENOUS | Status: DC
  Administered 2018-03-25: 08:00:00 via INTRAVENOUS

## 2018-03-24 NOTE — Consult Note (Signed)
[name] , MD [address] [place], [place], [place], [address] [address], [place], [place], [place], [address] Phone: [phone]  Fax: [phone]  Consultation  Referring Provider:     No ref. provider found Primary Care Physician:  [name], NP Primary Gastroenterologist:  None          Reason for Consultation:     GI bleed   Date of Admission:  03/23/2018 Date of Consultation:  03/24/2018         HPI:   [name] is a 42 y.o. female admitted with epiasgtric pain and bloody stools. Long term usage of Ibuprofen .  She says she has had on and off bloody diarrhea for a long time but had bright red blood per rectum the last few days. She has chronic epigastric discomfort and she has been on daily ibuprofen for many years for a knee pain. No prior colonoscopy, no family history of GI cancer. No bleeding after coming into the hospital.     CBC Latest Ref Rng & Units 03/24/2018 03/23/2018 03/23/2018  WBC 3.6 - 11.0 K/uL 12.8(H) - 17.5(H)  Hemoglobin 12.0 - 16.0 g/dL 14.1 13.9 14.9  Hematocrit 35.0 - 47.0 % 41.1 - 43.5  Platelets 150 - 440 K/uL 409 - 472(H)   BMP Latest Ref Rng & Units 03/24/2018 03/23/2018 01/15/2016  Glucose 70 - 99 mg/dL 85 86 95  BUN 6 - 20 mg/dL 6 7 13   Creatinine 0.44 - 1.00 mg/dL 0.52 0.52 0.66  Sodium 135 - 145 mmol/L 140 138 135  Potassium 3.5 - 5.1 mmol/L 3.7 3.8 3.9  Chloride 98 - 111 mmol/L 107 103 100(L)  CO2 22 - 32 mmol/L 26 26 28   Calcium 8.9 - 10.3 mg/dL 8.5(L) 9.7 9.5     Past Medical History:  Diagnosis Date  . Depression   . 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 Leukocytosis   . Tobacco use     Past Surgical History:  Procedure Laterality Date  . BREAST BIOPSY Right 2016   [place] core, fat necrosis  . KNEE CARTILAGE SURGERY Right 02/2015  . LAPAROSCOPIC TUBAL LIGATION      Prior to Admission medications   Medication Sig Start Date End Date Taking? Authorizing Provider  FLUoxetine (PROZAC) 40 MG  capsule Take 40 mg by mouth daily.   Yes [provider]  Multiple Vitamins-Minerals (MULTIVITAMIN WITH MINERALS) tablet Take 1 tablet by mouth at bedtime.   Yes [provider]  omega-3 acid ethyl esters (LOVAZA) 1 g capsule Take 1 g by mouth at bedtime.   Yes [provider]    Family History  Problem Relation Age of Onset  . Prostate cancer Father   . Breast cancer Maternal Grandmother 19     Social History   Tobacco Use  . Smoking status: Current Every Day Smoker    Packs/day: 1.00    Years: 7.00    Pack years: 7.00    Types: Cigarettes  . Smokeless tobacco: Never Used  Substance Use Topics  . Alcohol use: No    Alcohol/week: 0.0 oz  . Drug use: No    Allergies as of 03/23/2018  . (No Known Allergies)    Review of Systems:    All systems reviewed and negative except where noted in HPI.   Physical Exam:  Vital signs in last 24 hours: Temp:  [98.2 F (36.8 C)-99 F (37.2 C)] 98.5 F (36.9 C) (07/03 0446)  Pulse Rate:  [68-102] 78 (07/03 0446) Resp:  [15-21] 20 (07/03 0446) BP: (118-152)/(70-98) 118/77 (07/03 0446) SpO2:  [96 %-100 %] 97 % (07/03 0446) Weight:  [153 lb 8 oz (69.6 kg)-156 lb (70.8 kg)] 153 lb 8 oz (69.6 kg) (07/02 2045) Last BM Date: 03/23/18 General:   Pleasant, cooperative in NAD Head:  Normocephalic and atraumatic. Eyes:   No icterus.   Conjunctiva pink. PERRLA. Ears:  Normal auditory acuity. Neck:  Supple; no masses or thyroidomegaly Lungs: Respirations even and unlabored. Lungs clear to auscultation bilaterally.   No wheezes, crackles, or rhonchi.  Heart:  Regular rate and rhythm;  Without murmur, clicks, rubs or gallops Abdomen:  Soft, nondistended, nontender. Normal bowel sounds. No appreciable masses or hepatomegaly.  No rebound or guarding.  Neurologic:  Alert and oriented x3;  grossly normal neurologically. Skin:  Intact without significant lesions or rashes. Cervical Nodes:  No significant cervical  adenopathy. Psych:  Alert and cooperative. Normal affect.  LAB RESULTS: Recent Labs    03/23/18 1551 03/23/18 1553 03/24/18 0520  WBC 17.5*  --  12.8*  HGB 14.9 13.9 14.1  HCT 43.5  --  41.1  PLT 472*  --  409   BMET Recent Labs    03/23/18 1551 03/24/18 0520  NA 138 140  K 3.8 3.7  CL 103 107  CO2 26 26  GLUCOSE 86 85  BUN 7 6  CREATININE 0.52 0.52  CALCIUM 9.7 8.5*   LFT Recent Labs    03/23/18 1551  PROT 8.7*  ALBUMIN 5.2*  AST 22  ALT 20  ALKPHOS 73  BILITOT 1.7*   PT/INR No results for input(s): LABPROT, INR in the last 72 hours.  STUDIES: Ct Abdomen Pelvis W Contrast  Result Date: 03/23/2018 CLINICAL DATA:  Intermittent indigestion, burping and nausea since yesterday, intermittent rectal bleeding suddenly worsening yesterday with bright red blood EXAM: CT ABDOMEN AND PELVIS WITH CONTRAST TECHNIQUE: Multidetector CT imaging of the abdomen and pelvis was performed using the standard protocol following bolus administration of intravenous contrast. Sagittal and coronal MPR images reconstructed from axial data set. CONTRAST:  174mL OMNIPAQUE IOHEXOL 300 MG/ML SOLN IV. Dilute oral contrast. COMPARISON:  01/16/2016 FINDINGS: Lower chest: Lung bases clear Hepatobiliary: Large mildly hypervascular lesion in RIGHT lobe liver 7.0 x 5.6 x 5.4 cm with a small central scar unchanged since previous exam, question focal nodular hyperplasia. The second lesion seen on earlier study the medial aspect of the RIGHT lobe liver is less well demonstrated, approximately 2.0 x 1.4 cm. No additional hepatic mass lesions. Pancreas: Normal appearance Spleen: Normal appearance Adrenals/Urinary Tract: LEFT adrenal mass 17 x 15 mm image 24, previously 11 x 9 mm, of indeterminate attenuation. RIGHT adrenal gland unremarkable. Kidneys, ureters, and bladder normal appearance. Stomach/Bowel: Normal appearing retrocecal appendix. Stomach and bowel loops grossly normal appearance. No definite areas of  wall thickening or mass. No colonic diverticula. Vascular/Lymphatic: Vascular structures unremarkable. No adenopathy. Reproductive: Question small leiomyoma 11 mm diameter RIGHT uterus image 67. Small collapsed cyst within RIGHT ovary. LEFT ovarian cyst 3.3 x 2.9 cm. Other: No free air or free fluid. No hernia or acute inflammatory process. Musculoskeletal: Multifactorial mild narrowing of the spinal canal at L4-L5. LEFT paracentral disc protrusion L5-S1 potentially exerting mass effect upon the LEFT lateral recess and LEFT S1 root. IMPRESSION: Again identified large hypervascular mass RIGHT lobe liver 7.0 x 5.6 x 5.4 cm question focal nodular hyperplasia, less likely cavernous hemangioma. Second lesion seen previously in the RIGHT  lobe of the liver is less well demonstrated, approximately 2.0 x 1.4 cm, similar characteristics. Nonspecific LEFT adrenal mass 17 x 15 mm, increased since 01/16/2016; follow-up CT imaging of the abdomen with and without contrast with adrenal protocol is recommended to characterize and exclude malignancy. Small LEFT ovarian cyst 3.3 cm diameter and collapsed RIGHT ovarian cyst. Small LEFT paracentral disc protrusion at L5-S1 question exerting mass effect upon LEFT lateral recess and LEFT S1 nerve root. Electronically Signed   By: Lavonia Dana M.D.   On: 03/23/2018 17:50      Impression / Plan:   [name] is a 42 y.o. y/o female with long term use of NSAID's comes in to the hospital with bright red blood per rectum and normal HB suggesting a lower GI bleed. She has along history of on and off bloody diarrhea which may suggest she has colitis either from NSAID's or from IBS. I do note she has had an elevated [place] for a long time which could be from colonic inflammation     Plan  1. Stop all NSAID's 2. Stool H pylori antigen  3. EGD+ colonoscopy tomorrow.  4. Clears till5 pm   I have discussed alternative options, risks & benefits,  which include, but are not limited  to, bleeding, infection, perforation,respiratory complication & drug reaction.  The patient agrees with this plan & written consent will be obtained.     Thank you for involving me in the care of this patient.      LOS: 1 day   Jasmine Bellows, MD  03/24/2018, 9:13 AM

## 2018-03-24 NOTE — Progress Notes (Signed)
[facility] - [place] [facility]   PATIENT NAME: [name]    MR#:  [id number]  DATE OF BIRTH:  11/19/1975  SUBJECTIVE:  CHIEF COMPLAINT:   Chief Complaint  Patient presents with  . Gastroesophageal Reflux  . Rectal Bleeding   No bleeding today Epigastric pain  REVIEW OF SYSTEMS:    Review of Systems  Constitutional: Positive for malaise/fatigue. Negative for chills and fever.  HENT: Negative for sore throat.   Eyes: Negative for blurred vision, double vision and pain.  Respiratory: Negative for cough, hemoptysis, shortness of breath and wheezing.   Cardiovascular: Negative for chest pain, palpitations, orthopnea and leg swelling.  Gastrointestinal: Positive for abdominal pain and blood in stool. Negative for constipation, diarrhea, heartburn, nausea and vomiting.  Genitourinary: Negative for dysuria and hematuria.  Musculoskeletal: Negative for back pain and joint pain.  Skin: Negative for rash.  Neurological: Negative for sensory change, speech change, focal weakness and headaches.  Endo/Heme/Allergies: Does not bruise/bleed easily.  Psychiatric/Behavioral: Negative for depression. The patient is not nervous/anxious.     DRUG ALLERGIES:  No Known Allergies  VITALS:  Blood pressure 118/77, pulse 78, temperature 98.5 F (36.9 C), temperature source Oral, resp. rate 20, height 5' 2" (1.575 m), weight 69.6 kg (153 lb 8 oz), last menstrual period 03/06/2018, SpO2 97 %.  PHYSICAL EXAMINATION:   Physical Exam  GENERAL:  42 y.o.-year-old patient lying in the bed with no acute distress.  EYES: Pupils equal, round, reactive to light and accommodation. No scleral icterus. Extraocular muscles intact.  HEENT: Head atraumatic, normocephalic. Oropharynx and nasopharynx clear.  NECK:  Supple, no jugular venous distention. No thyroid enlargement, no tenderness.  LUNGS: Normal breath sounds bilaterally, no wheezing, rales, rhonchi. No use of accessory muscles  of respiration.  CARDIOVASCULAR: S1, S2 normal. No murmurs, rubs, or gallops.  ABDOMEN: Soft, nontender, nondistended. Bowel sounds present. No organomegaly or mass.  EXTREMITIES: No cyanosis, clubbing or edema b/l.    NEUROLOGIC: Cranial nerves II through XII are intact. No focal Motor or sensory deficits b/l.   PSYCHIATRIC: The patient is alert and oriented x 3.  SKIN: No obvious rash, lesion, or ulcer.   LABORATORY PANEL:   CBC Recent Labs  Lab 03/24/18 0520  WBC 12.8*  HGB 14.1  HCT 41.1  PLT 409   ------------------------------------------------------------------------------------------------------------------ Chemistries  Recent Labs  Lab 03/23/18 1551 03/24/18 0520  NA 138 140  K 3.8 3.7  CL 103 107  CO2 26 26  GLUCOSE 86 85  BUN 7 6  CREATININE 0.52 0.52  CALCIUM 9.7 8.5*  AST 22  --   ALT 20  --   ALKPHOS 73  --   BILITOT 1.7*  --    ------------------------------------------------------------------------------------------------------------------  Cardiac Enzymes Recent Labs  Lab 03/23/18 1551  TROPONINI <0.03   ------------------------------------------------------------------------------------------------------------------  RADIOLOGY:  Ct Abdomen Pelvis W Contrast  Result Date: 03/23/2018 CLINICAL DATA:  Intermittent indigestion, burping and nausea since yesterday, intermittent rectal bleeding suddenly worsening yesterday with bright red blood EXAM: CT ABDOMEN AND PELVIS WITH CONTRAST TECHNIQUE: Multidetector CT imaging of the abdomen and pelvis was performed using the standard protocol following bolus administration of intravenous contrast. Sagittal and coronal MPR images reconstructed from axial data set. CONTRAST:  100mL OMNIPAQUE IOHEXOL 300 MG/ML SOLN IV. Dilute oral contrast. COMPARISON:  01/16/2016 FINDINGS: Lower chest: Lung bases clear Hepatobiliary: Large mildly hypervascular lesion in RIGHT lobe liver 7.0 x 5.6 x 5.4 cm with a small central scar  unchanged since previous   exam, question focal nodular hyperplasia. The second lesion seen on earlier study the medial aspect of the RIGHT lobe liver is less well demonstrated, approximately 2.0 x 1.4 cm. No additional hepatic mass lesions. Pancreas: Normal appearance Spleen: Normal appearance Adrenals/Urinary Tract: LEFT adrenal mass 17 x 15 mm image 24, previously 11 x 9 mm, of indeterminate attenuation. RIGHT adrenal gland unremarkable. Kidneys, ureters, and bladder normal appearance. Stomach/Bowel: Normal appearing retrocecal appendix. Stomach and bowel loops grossly normal appearance. No definite areas of wall thickening or mass. No colonic diverticula. Vascular/Lymphatic: Vascular structures unremarkable. No adenopathy. Reproductive: Question small leiomyoma 11 mm diameter RIGHT uterus image 67. Small collapsed cyst within RIGHT ovary. LEFT ovarian cyst 3.3 x 2.9 cm. Other: No free air or free fluid. No hernia or acute inflammatory process. Musculoskeletal: Multifactorial mild narrowing of the spinal canal at L4-L5. LEFT paracentral disc protrusion L5-S1 potentially exerting mass effect upon the LEFT lateral recess and LEFT S1 root. IMPRESSION: Again identified large hypervascular mass RIGHT lobe liver 7.0 x 5.6 x 5.4 cm question focal nodular hyperplasia, less likely cavernous hemangioma. Second lesion seen previously in the RIGHT lobe of the liver is less well demonstrated, approximately 2.0 x 1.4 cm, similar characteristics. Nonspecific LEFT adrenal mass 17 x 15 mm, increased since 01/16/2016; follow-up CT imaging of the abdomen with and without contrast with adrenal protocol is recommended to characterize and exclude malignancy. Small LEFT ovarian cyst 3.3 cm diameter and collapsed RIGHT ovarian cyst. Small LEFT paracentral disc protrusion at L5-S1 question exerting mass effect upon LEFT lateral recess and LEFT S1 nerve root. Electronically Signed   By: Lavonia Dana M.D.   On: 03/23/2018 17:50      ASSESSMENT AND PLAN:   [name]  is a 42 y.o. 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  1.  GI bleed-could be upper GI especially with use of NSAID's  hemoglobin is stable.  -  IV Protonix twice daily -GI consult requested. Discussed with Dr. Vicente Males. EGD/colonoscopy to be scheduled  2.  Benign leukocytosis-baseline around 15 K.   stable  3.  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   Follows with [facility] hepatology.  4.  DVT prophylaxis-teds and SCDs.  No heparin products due to GI bleed  5.  Tobacco use disorder-on nicotine patch.  All the records are reviewed and case discussed with Care Management/Social Workerr. Management plans discussed with the patient, family and they are in agreement.  CODE STATUS: FULL CODE  DVT Prophylaxis: SCDs  TOTAL TIME TAKING CARE OF THIS PATIENT: 30 minutes.   POSSIBLE D/C IN 1-2 DAYS, DEPENDING ON CLINICAL CONDITION.  Leia Alf Brihanna Devenport M.D on 03/24/2018 at 1:31 PM  Between 7am to 6pm - Pager - [phone]  After 6pm go to [email or web address] - password [email or web address] [facility]  [facility] [place] [facility]  Office  [phone]  CC: Primary care physician; [name], NP  Note: This dictation was prepared with Dragon dictation along with smaller phrase technology. Any transcriptional errors that result from this process are unintentional.

## 2018-03-24 NOTE — H&P (View-Only) (Signed)
[name] , MD [address] [place], [place], [place], [address] [address], [place], [place], [place], [address] Phone: [phone]  Fax: [phone]  Consultation  Referring Provider:     No ref. provider found Primary Care Physician:  [name], NP Primary Gastroenterologist:  None          Reason for Consultation:     GI bleed   Date of Admission:  03/23/2018 Date of Consultation:  03/24/2018         HPI:   [name] is a 42 y.o. female admitted with epiasgtric pain and bloody stools. Long term usage of Ibuprofen .  She says she has had on and off bloody diarrhea for a long time but had bright red blood per rectum the last few days. She has chronic epigastric discomfort and she has been on daily ibuprofen for many years for a knee pain. No prior colonoscopy, no family history of GI cancer. No bleeding after coming into the hospital.     CBC Latest Ref Rng & Units 03/24/2018 03/23/2018 03/23/2018  WBC 3.6 - 11.0 K/uL 12.8(H) - 17.5(H)  Hemoglobin 12.0 - 16.0 g/dL 14.1 13.9 14.9  Hematocrit 35.0 - 47.0 % 41.1 - 43.5  Platelets 150 - 440 K/uL 409 - 472(H)   BMP Latest Ref Rng & Units 03/24/2018 03/23/2018 01/15/2016  Glucose 70 - 99 mg/dL 85 86 95  BUN 6 - 20 mg/dL 6 7 13   Creatinine 0.44 - 1.00 mg/dL 0.52 0.52 0.66  Sodium 135 - 145 mmol/L 140 138 135  Potassium 3.5 - 5.1 mmol/L 3.7 3.8 3.9  Chloride 98 - 111 mmol/L 107 103 100(L)  CO2 22 - 32 mmol/L 26 26 28   Calcium 8.9 - 10.3 mg/dL 8.5(L) 9.7 9.5     Past Medical History:  Diagnosis Date  . Depression   . 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 Leukocytosis   . Tobacco use     Past Surgical History:  Procedure Laterality Date  . BREAST BIOPSY Right 2016   [place] core, fat necrosis  . KNEE CARTILAGE SURGERY Right 02/2015  . LAPAROSCOPIC TUBAL LIGATION      Prior to Admission medications   Medication Sig Start Date End Date Taking? Authorizing Provider  FLUoxetine (PROZAC) 40 MG  capsule Take 40 mg by mouth daily.   Yes [provider]  Multiple Vitamins-Minerals (MULTIVITAMIN WITH MINERALS) tablet Take 1 tablet by mouth at bedtime.   Yes [provider]  omega-3 acid ethyl esters (LOVAZA) 1 g capsule Take 1 g by mouth at bedtime.   Yes [provider]    Family History  Problem Relation Age of Onset  . Prostate cancer Father   . Breast cancer Maternal Grandmother 46     Social History   Tobacco Use  . Smoking status: Current Every Day Smoker    Packs/day: 1.00    Years: 7.00    Pack years: 7.00    Types: Cigarettes  . Smokeless tobacco: Never Used  Substance Use Topics  . Alcohol use: No    Alcohol/week: 0.0 oz  . Drug use: No    Allergies as of 03/23/2018  . (No Known Allergies)    Review of Systems:    All systems reviewed and negative except where noted in HPI.   Physical Exam:  Vital signs in last 24 hours: Temp:  [98.2 F (36.8 C)-99 F (37.2 C)] 98.5 F (36.9 C) (07/03 0446)  Pulse Rate:  [68-102] 78 (07/03 0446) Resp:  [15-21] 20 (07/03 0446) BP: (118-152)/(70-98) 118/77 (07/03 0446) SpO2:  [96 %-100 %] 97 % (07/03 0446) Weight:  [153 lb 8 oz (69.6 kg)-156 lb (70.8 kg)] 153 lb 8 oz (69.6 kg) (07/02 2045) Last BM Date: 03/23/18 General:   Pleasant, cooperative in NAD Head:  Normocephalic and atraumatic. Eyes:   No icterus.   Conjunctiva pink. PERRLA. Ears:  Normal auditory acuity. Neck:  Supple; no masses or thyroidomegaly Lungs: Respirations even and unlabored. Lungs clear to auscultation bilaterally.   No wheezes, crackles, or rhonchi.  Heart:  Regular rate and rhythm;  Without murmur, clicks, rubs or gallops Abdomen:  Soft, nondistended, nontender. Normal bowel sounds. No appreciable masses or hepatomegaly.  No rebound or guarding.  Neurologic:  Alert and oriented x3;  grossly normal neurologically. Skin:  Intact without significant lesions or rashes. Cervical Nodes:  No significant cervical  adenopathy. Psych:  Alert and cooperative. Normal affect.  LAB RESULTS: Recent Labs    03/23/18 1551 03/23/18 1553 03/24/18 0520  WBC 17.5*  --  12.8*  HGB 14.9 13.9 14.1  HCT 43.5  --  41.1  PLT 472*  --  409   BMET Recent Labs    03/23/18 1551 03/24/18 0520  NA 138 140  K 3.8 3.7  CL 103 107  CO2 26 26  GLUCOSE 86 85  BUN 7 6  CREATININE 0.52 0.52  CALCIUM 9.7 8.5*   LFT Recent Labs    03/23/18 1551  PROT 8.7*  ALBUMIN 5.2*  AST 22  ALT 20  ALKPHOS 73  BILITOT 1.7*   PT/INR No results for input(s): LABPROT, INR in the last 72 hours.  STUDIES: Ct Abdomen Pelvis W Contrast  Result Date: 03/23/2018 CLINICAL DATA:  Intermittent indigestion, burping and nausea since yesterday, intermittent rectal bleeding suddenly worsening yesterday with bright red blood EXAM: CT ABDOMEN AND PELVIS WITH CONTRAST TECHNIQUE: Multidetector CT imaging of the abdomen and pelvis was performed using the standard protocol following bolus administration of intravenous contrast. Sagittal and coronal MPR images reconstructed from axial data set. CONTRAST:  194mL OMNIPAQUE IOHEXOL 300 MG/ML SOLN IV. Dilute oral contrast. COMPARISON:  01/16/2016 FINDINGS: Lower chest: Lung bases clear Hepatobiliary: Large mildly hypervascular lesion in RIGHT lobe liver 7.0 x 5.6 x 5.4 cm with a small central scar unchanged since previous exam, question focal nodular hyperplasia. The second lesion seen on earlier study the medial aspect of the RIGHT lobe liver is less well demonstrated, approximately 2.0 x 1.4 cm. No additional hepatic mass lesions. Pancreas: Normal appearance Spleen: Normal appearance Adrenals/Urinary Tract: LEFT adrenal mass 17 x 15 mm image 24, previously 11 x 9 mm, of indeterminate attenuation. RIGHT adrenal gland unremarkable. Kidneys, ureters, and bladder normal appearance. Stomach/Bowel: Normal appearing retrocecal appendix. Stomach and bowel loops grossly normal appearance. No definite areas of  wall thickening or mass. No colonic diverticula. Vascular/Lymphatic: Vascular structures unremarkable. No adenopathy. Reproductive: Question small leiomyoma 11 mm diameter RIGHT uterus image 67. Small collapsed cyst within RIGHT ovary. LEFT ovarian cyst 3.3 x 2.9 cm. Other: No free air or free fluid. No hernia or acute inflammatory process. Musculoskeletal: Multifactorial mild narrowing of the spinal canal at L4-L5. LEFT paracentral disc protrusion L5-S1 potentially exerting mass effect upon the LEFT lateral recess and LEFT S1 root. IMPRESSION: Again identified large hypervascular mass RIGHT lobe liver 7.0 x 5.6 x 5.4 cm question focal nodular hyperplasia, less likely cavernous hemangioma. Second lesion seen previously in the RIGHT  lobe of the liver is less well demonstrated, approximately 2.0 x 1.4 cm, similar characteristics. Nonspecific LEFT adrenal mass 17 x 15 mm, increased since 01/16/2016; follow-up CT imaging of the abdomen with and without contrast with adrenal protocol is recommended to characterize and exclude malignancy. Small LEFT ovarian cyst 3.3 cm diameter and collapsed RIGHT ovarian cyst. Small LEFT paracentral disc protrusion at L5-S1 question exerting mass effect upon LEFT lateral recess and LEFT S1 nerve root. Electronically Signed   By: Lavonia Dana M.D.   On: 03/23/2018 17:50      Impression / Plan:   [name] is a 42 y.o. y/o female with long term use of NSAID's comes in to the hospital with bright red blood per rectum and normal HB suggesting a lower GI bleed. She has along history of on and off bloody diarrhea which may suggest she has colitis either from NSAID's or from IBS. I do note she has had an elevated [place] for a long time which could be from colonic inflammation     Plan  1. Stop all NSAID's 2. Stool H pylori antigen  3. EGD+ colonoscopy tomorrow.  4. Clears till5 pm   I have discussed alternative options, risks & benefits,  which include, but are not limited  to, bleeding, infection, perforation,respiratory complication & drug reaction.  The patient agrees with this plan & written consent will be obtained.     Thank you for involving me in the care of this patient.      LOS: 1 day   Jasmine Bellows, MD  03/24/2018, 9:13 AM

## 2018-03-25 ENCOUNTER — Encounter
Admission: EM | Disposition: A | Discharge status: Home / Self Care | Payer: Self-pay | Source: Home / Self Care | Attending: Internal Medicine

## 2018-03-25 ENCOUNTER — Encounter: Payer: Self-pay | Admitting: Emergency Medicine

## 2018-03-25 ENCOUNTER — Inpatient Hospital Stay: Payer: Medicaid Other | Admitting: Registered Nurse

## 2018-03-25 HISTORY — PX: ESOPHAGOGASTRODUODENOSCOPY (EGD) WITH PROPOFOL: SHX5813

## 2018-03-25 HISTORY — PX: COLONOSCOPY WITH PROPOFOL: SHX5780

## 2018-03-25 LAB — HEMOGLOBIN: HEMOGLOBIN: 13.3 g/dL (ref 12.0–16.0)

## 2018-03-25 LAB — HIV ANTIBODY (ROUTINE TESTING W REFLEX): HIV Screen 4th Generation wRfx: NONREACTIVE

## 2018-03-25 SURGERY — ESOPHAGOGASTRODUODENOSCOPY (EGD) WITH PROPOFOL
Anesthesia: General

## 2018-03-25 MED ORDER — PANTOPRAZOLE SODIUM 40 MG PO TBEC: 40.0000 mg | DELAYED_RELEASE_TABLET | Freq: Two times a day (BID) | ORAL | 1 refills | Status: DC

## 2018-03-25 MED ORDER — NICOTINE 21 MG/24HR TD PT24: 21.0000 mg | MEDICATED_PATCH | Freq: Every day | TRANSDERMAL | 0 refills | Status: DC

## 2018-03-25 MED ORDER — PROPOFOL 500 MG/50ML IV EMUL
INTRAVENOUS | Status: AC
  Filled 2018-03-25: qty 50

## 2018-03-25 MED ORDER — SODIUM CHLORIDE 0.9 % IV SOLN: INTRAVENOUS | Status: DC

## 2018-03-25 MED ORDER — PROPOFOL 10 MG/ML IV BOLUS
INTRAVENOUS | Status: DC | PRN
  Administered 2018-03-25: 40 mg via INTRAVENOUS
  Administered 2018-03-25: 60 mg via INTRAVENOUS

## 2018-03-25 MED ORDER — LIDOCAINE 2% (20 MG/ML) 5 ML SYRINGE
INTRAMUSCULAR | Status: DC | PRN
  Administered 2018-03-25: 100 mg via INTRAVENOUS

## 2018-03-25 MED ORDER — PHENYLEPHRINE HCL 10 MG/ML IJ SOLN
INTRAMUSCULAR | Status: DC | PRN
  Administered 2018-03-25 (×2): 100 ug via INTRAVENOUS

## 2018-03-25 MED ORDER — PROPOFOL 500 MG/50ML IV EMUL
INTRAVENOUS | Status: DC | PRN
  Administered 2018-03-25: 200 ug/kg/min via INTRAVENOUS

## 2018-03-25 MED ORDER — MIDAZOLAM HCL 2 MG/2ML IJ SOLN
INTRAMUSCULAR | Status: AC
  Filled 2018-03-25: qty 2

## 2018-03-25 MED ORDER — GLYCOPYRROLATE 0.2 MG/ML IJ SOLN
INTRAMUSCULAR | Status: DC | PRN
  Administered 2018-03-25: 0.2 mg via INTRAVENOUS

## 2018-03-25 MED ORDER — MIDAZOLAM HCL 2 MG/2ML IJ SOLN
INTRAMUSCULAR | Status: DC | PRN
  Administered 2018-03-25: 2 mg via INTRAVENOUS

## 2018-03-25 NOTE — Progress Notes (Signed)
Nsg Discharge Note  Admit Date:  03/23/2018 Discharge date: 03/25/2018   [name] to be D/C'd Home per MD order.  AVS completed.  Copy for chart, and copy for patient signed, and dated. Patient/caregiver able to verbalize understanding.  Discharge Medication: Allergies as of 03/25/2018   No Known Allergies     Medication List    TAKE these medications   FLUoxetine 40 MG capsule Commonly known as:  PROZAC Take 40 mg by mouth daily.   multivitamin with minerals tablet Take 1 tablet by mouth at bedtime.   nicotine 21 mg/24hr patch Commonly known as:  NICODERM CQ - dosed in mg/24 hours Place 1 patch (21 mg total) onto the skin daily.   omega-3 acid ethyl esters 1 g capsule Commonly known as:  LOVAZA Take 1 g by mouth at bedtime.   pantoprazole 40 MG tablet Commonly known as:  PROTONIX Take 1 tablet (40 mg total) by mouth 2 (two) times daily.       Discharge Assessment: Vitals:   03/25/18 0846 03/25/18 0856  BP: (!) 102/59 112/64  Pulse: 73 78  Resp: 15 20  Temp:    SpO2: 100% 99%   Skin clean, dry and intact without evidence of skin break down, no evidence of skin tears noted. IV catheter discontinued intact. Site without signs and symptoms of complications - no redness or edema noted at insertion site, patient denies c/o pain - only slight tenderness at site.  Dressing with slight pressure applied.  D/c Instructions-Education: Discharge instructions given to patient/family with verbalized understanding. D/c education completed with patient/family including follow up instructions, medication list, d/c activities limitations if indicated, with other d/c instructions as indicated by MD - patient able to verbalize understanding, all questions fully answered. Patient instructed to return to ED, call 911, or call MD for any changes in condition.  Patient escorted via [place], and D/C home via private auto.  Eda Keys, RN 03/25/2018 10:42 AM

## 2018-03-25 NOTE — Transfer of Care (Signed)
Immediate Anesthesia Transfer of Care Note  Patient: [name]  Procedure(s) Performed: ESOPHAGOGASTRODUODENOSCOPY (EGD) WITH PROPOFOL (N/A ) COLONOSCOPY WITH PROPOFOL (N/A )  Patient Location: Endoscopy Unit  Anesthesia Type:General  Level of Consciousness: sedated  Airway & Oxygen Therapy: Patient connected to nasal cannula oxygen  Post-op Assessment: Post -op Vital signs reviewed and stable  Post vital signs: stable  Last Vitals:  Vitals Value Taken Time  BP    Temp    Pulse    Resp    SpO2      Last Pain:  Vitals:   03/25/18 0738  TempSrc:   PainSc: 0-No pain         Complications: No apparent anesthesia complications

## 2018-03-25 NOTE — Interval H&P Note (Signed)
History and Physical Interval Note:  03/25/2018 7:59 AM  [name]  has presented today for surgery, with the diagnosis of GIB  The various methods of treatment have been discussed with the patient and family. After consideration of risks, benefits and other options for treatment, the patient has consented to  Procedure(s): ESOPHAGOGASTRODUODENOSCOPY (EGD) WITH PROPOFOL (N/A) COLONOSCOPY WITH PROPOFOL (N/A) as a surgical intervention .  The patient's history has been reviewed, patient examined, no change in status, stable for surgery.  I have reviewed the patient's chart and labs.  Questions were answered to the patient's satisfaction.     [place], [place]

## 2018-03-25 NOTE — Anesthesia Post-op Follow-up Note (Signed)
Anesthesia QCDR form completed.        

## 2018-03-25 NOTE — Discharge Summary (Signed)
[facility] - [place] [facility]   PATIENT NAME: [name]    MR#:  [id number]  DATE OF BIRTH:  02/25/1976  DATE OF ADMISSION:  03/23/2018 ADMITTING PHYSICIAN: Jasmine Kalisetti, MD  DATE OF DISCHARGE: No discharge date for patient encounter.  PRIMARY CARE PHYSICIAN: Rubio, Jessica, NP    ADMISSION DIAGNOSIS:  Rectal bleeding [K62.5]  DISCHARGE DIAGNOSIS:  Active Problems:   GI bleed   SECONDARY DIAGNOSIS:   Past Medical History:  Diagnosis Date  . Depression   . 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 Leukocytosis   . Tobacco use     HOSPITAL COURSE:  [name] a42 y.o.femalewith a known history of benign leukocytosis confirmed by bone marrow biopsy, focal nodular hyperplasia of liver confirmed by biopsy patient is to hospital secondary to worsening epigastric pain and also bloody stools.  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  We will need to continue to follow-up with [facility] hepatology status post discharge   DISCHARGE CONDITIONS:   stable  CONSULTS OBTAINED:  Treatment Team:  [name], Kiran, MD Hartman, Teodoro K, MD  DRUG ALLERGIES:  No Known Allergies  DISCHARGE MEDICATIONS:   Allergies as of 03/25/2018   No Known Allergies     Medication List    TAKE these medications   FLUoxetine 40 MG capsule Commonly known as:  PROZAC Take 40 mg by mouth daily.   multivitamin with minerals tablet Take 1 tablet by mouth at bedtime.   nicotine 21 mg/24hr patch Commonly known as:  NICODERM CQ - dosed in mg/24 hours Place 1 patch (21 mg total) onto the skin daily.   omega-3 acid ethyl esters 1 g  capsule Commonly known as:  LOVAZA Take 1 g by mouth at bedtime.   pantoprazole 40 MG tablet Commonly known as:  PROTONIX Take 1 tablet (40 mg total) by mouth 2 (two) times daily.        DISCHARGE INSTRUCTIONS:   If you experience worsening of your admission symptoms, develop shortness of breath, life threatening emergency, suicidal or homicidal thoughts you must seek medical attention immediately by calling 911 or calling your MD immediately  if symptoms less severe.  You Must read complete instructions/literature along with all the possible adverse reactions/side effects for all the Medicines you take and that have been prescribed to you. Take any new Medicines after you have completely understood and accept all the possible adverse reactions/side effects.   Please note  You were cared for by a hospitalist during your hospital stay. If you have any questions about your discharge medications or the care you received while you were in the hospital after you are discharged, you can call the unit and asked to speak with the hospitalist on call if the hospitalist that took care of you is not available. Once you are discharged, your primary care physician will handle any further medical issues. Please note that NO REFILLS for any discharge medications will be authorized once you are discharged, as it is imperative that you return to your primary care physician (or establish a relationship with a primary care physician if you do not have one) for your aftercare needs so that they can reassess your   need for medications and monitor your lab values.    Today   CHIEF COMPLAINT:   Chief Complaint  Patient presents with  . Gastroesophageal Reflux  . Rectal Bleeding    HISTORY OF PRESENT ILLNESS:  42 y.o. female with a known history of benign leukocytosis confirmed by bone marrow biopsy, focal nodular hyperplasia of liver confirmed by biopsy patient is to hospital secondary to worsening  epigastric pain and also bloody stools. Patient has had bloody stools in the past which were spontaneously resolved.  Since yesterday she noticed having worsening epigastric pain followed by stool mixed with blood initially.  This morning she has had 2 episodes of bright red blood per rectum and so presented to the emergency room.  Hemoglobin is stable around 14.9.  Complains of nausea but no vomiting.  She has been taking ibuprofen for a long time at least 2-3 times a day.  Has not taken any in the last 2 to 3 days.  No prior EGD or colonoscopy.  Epigastric pain improved with Maalox.  Complains of some headache.  Stool for guaiac was positive here.  VITAL SIGNS:  Blood pressure 112/64, pulse 78, temperature 98.4 F (36.9 C), temperature source Tympanic, resp. rate 20, height 5' 2" (1.575 m), weight 69.6 kg (153 lb 8 oz), last menstrual period 03/06/2018, SpO2 99 %.  I/O:    Intake/Output Summary (Last 24 hours) at 03/25/2018 0942 Last data filed at 03/24/2018 1727 Gross per 24 hour  Intake 456 ml  Output 1100 ml  Net -644 ml    PHYSICAL EXAMINATION:  GENERAL:  42 y.o.-year-old patient lying in the bed with no acute distress.  EYES: Pupils equal, round, reactive to light and accommodation. No scleral icterus. Extraocular muscles intact.  HEENT: Head atraumatic, normocephalic. Oropharynx and nasopharynx clear.  NECK:  Supple, no jugular venous distention. No thyroid enlargement, no tenderness.  LUNGS: Normal breath sounds bilaterally, no wheezing, rales,rhonchi or crepitation. No use of accessory muscles of respiration.  CARDIOVASCULAR: S1, S2 normal. No murmurs, rubs, or gallops.  ABDOMEN: Soft, non-tender, non-distended. Bowel sounds present. No organomegaly or mass.  EXTREMITIES: No pedal edema, cyanosis, or clubbing.  NEUROLOGIC: Cranial nerves II through XII are intact. Muscle strength 5/5 in all extremities. Sensation intact. Gait not checked.  PSYCHIATRIC: The patient is alert and  oriented x 3.  SKIN: No obvious rash, lesion, or ulcer.   DATA REVIEW:   CBC Recent Labs  Lab 03/24/18 0520 03/25/18 0442  WBC 12.8*  --   HGB 14.1 13.3  HCT 41.1  --   PLT 409  --     Chemistries  Recent Labs  Lab 03/23/18 1551 03/24/18 0520  NA 138 140  Hartman 3.8 3.7  CL 103 107  CO2 26 26  GLUCOSE 86 85  BUN 7 6  CREATININE 0.52 0.52  CALCIUM 9.7 8.5*  AST 22  --   ALT 20  --   ALKPHOS 73  --   BILITOT 1.7*  --     Cardiac Enzymes Recent Labs  Lab 03/23/18 1551  [place] <0.03    Microbiology Results  Results for orders placed or performed during the hospital encounter of 06/19/16  Rapid strep screen     Status: None   Collection Time: 06/19/16 11:29 AM  Result Value Ref Range Status   Streptococcus, Group A Screen (Direct) NEGATIVE NEGATIVE Final    Comment: (NOTE) A Rapid Antigen test may result negative if the antigen level in the sample is  below the detection level of this test. The FDA has not cleared this test as a stand-alone test therefore the rapid antigen negative result has reflexed to a Group A Strep culture.   Culture, group A strep     Status: None   Collection Time: 06/19/16 11:29 AM  Result Value Ref Range Status   Specimen Description THROAT  Final   Special Requests NONE Reflexed from [id number]  Final   Culture   Final    NO GROUP A STREP (S.PYOGENES) ISOLATED Performed at [facility]    Report Status 06/21/2016 FINAL  Final  Rapid Influenza A&B Antigens ([facility] only)     Status: None   Collection Time: 06/19/16 11:30 AM  Result Value Ref Range Status   Influenza A ([facility]) NEGATIVE NEGATIVE Final   Influenza B ([facility]) NEGATIVE NEGATIVE Final    RADIOLOGY:  Ct Abdomen Pelvis W Contrast  Result Date: 03/23/2018 CLINICAL DATA:  Intermittent indigestion, burping and nausea since yesterday, intermittent rectal bleeding suddenly worsening yesterday with bright red blood EXAM: CT ABDOMEN AND PELVIS WITH CONTRAST TECHNIQUE:  Multidetector CT imaging of the abdomen and pelvis was performed using the standard protocol following bolus administration of intravenous contrast. Sagittal and coronal MPR images reconstructed from axial data set. CONTRAST:  100mL OMNIPAQUE IOHEXOL 300 MG/ML SOLN IV. Dilute oral contrast. COMPARISON:  01/16/2016 FINDINGS: Lower chest: Lung bases clear Hepatobiliary: Large mildly hypervascular lesion in RIGHT lobe liver 7.0 x 5.6 x 5.4 cm with a small central scar unchanged since previous exam, question focal nodular hyperplasia. The second lesion seen on earlier study the medial aspect of the RIGHT lobe liver is less well demonstrated, approximately 2.0 x 1.4 cm. No additional hepatic mass lesions. Pancreas: Normal appearance Spleen: Normal appearance Adrenals/Urinary Tract: LEFT adrenal mass 17 x 15 mm image 24, previously 11 x 9 mm, of indeterminate attenuation. RIGHT adrenal gland unremarkable. Kidneys, ureters, and bladder normal appearance. Stomach/Bowel: Normal appearing retrocecal appendix. Stomach and bowel loops grossly normal appearance. No definite areas of wall thickening or mass. No colonic diverticula. Vascular/Lymphatic: Vascular structures unremarkable. No adenopathy. Reproductive: Question small leiomyoma 11 mm diameter RIGHT uterus image 67. Small collapsed cyst within RIGHT ovary. LEFT ovarian cyst 3.3 x 2.9 cm. Other: No free air or free fluid. No hernia or acute inflammatory process. Musculoskeletal: Multifactorial mild narrowing of the spinal canal at L4-L5. LEFT paracentral disc protrusion L5-S1 potentially exerting mass effect upon the LEFT lateral recess and LEFT S1 root. IMPRESSION: Again identified large hypervascular mass RIGHT lobe liver 7.0 x 5.6 x 5.4 cm question focal nodular hyperplasia, less likely cavernous hemangioma. Second lesion seen previously in the RIGHT lobe of the liver is less well demonstrated, approximately 2.0 x 1.4 cm, similar characteristics. 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 Small LEFT ovarian cyst 3.3 cm diameter and collapsed RIGHT ovarian cyst. Small LEFT paracentral disc protrusion at L5-S1 question exerting mass effect upon LEFT lateral recess and LEFT S1 nerve root. Electronically Signed   By: Mark  Boles M.D.   On: 03/23/2018 17:50    EKG:   Orders placed or performed during the hospital encounter of 03/23/18  . ED EKG  . ED EKG  . EKG 12-Lead  . EKG 12-Lead  . ED EKG  . ED EKG      Management plans discussed with the patient, family   and they are in agreement.  CODE STATUS:     Code Status Orders  (From admission, onward)        Start     Ordered   03/23/18 2050  Full code  Continuous     03/23/18 2049    Code Status History    This patient has a current code status but no historical code status.      TOTAL TIME TAKING CARE OF THIS PATIENT: 45 minutes.    Avel Peace Aisa Schoeppner M.D on 03/25/2018 at 9:42 AM  Between 7am to 6pm - Pager - [phone]  After 6pm go to [email or web address] - password [email or web address] [facility]  Office  [phone]  CC: Primary care physician; [name], NP   Note: This dictation was prepared with Dragon dictation along with smaller phrase technology. Any transcriptional errors that result from this process are unintentional.

## 2018-03-25 NOTE — Anesthesia Preprocedure Evaluation (Signed)
Anesthesia Evaluation  Patient identified by MRN, date of birth, ID band Patient awake    Reviewed: Allergy & Precautions, H&P , NPO status , Patient's Chart, lab work & pertinent test results, reviewed documented beta blocker date and time   History of Anesthesia Complications Negative for: history of anesthetic complications  Airway Mallampati: II  TM Distance: >3 FB Neck ROM: full    Dental  (+) Dental Advidsory Given, Teeth Intact   Pulmonary neg shortness of breath, neg COPD, neg recent URI, Current Smoker,           Cardiovascular Exercise Tolerance: Good negative cardio ROS       Neuro/Psych PSYCHIATRIC DISORDERS Depression negative neurological ROS     GI/Hepatic Neg liver ROS, GERD  ,  Endo/Other  negative endocrine ROS  Renal/GU negative Renal ROS  negative genitourinary   Musculoskeletal   Abdominal   Peds  Hematology negative hematology ROS (+)   Anesthesia Other Findings Past Medical History: No date: Depression No date: Focal nodular hyperplasia determined by liver biopsy No date: Leukocytosis No date: Tobacco use   Reproductive/Obstetrics negative OB ROS                             Anesthesia Physical Anesthesia Plan  ASA: II  Anesthesia Plan: General   Post-op Pain Management:    Induction: Intravenous  PONV Risk Score and Plan: 2 and Propofol infusion  Airway Management Planned: Nasal Cannula  Additional Equipment:   Intra-op Plan:   Post-operative Plan:   Informed Consent: I have reviewed the patients History and Physical, chart, labs and discussed the procedure including the risks, benefits and alternatives for the proposed anesthesia with the patient or authorized representative who has indicated his/her understanding and acceptance.   Dental Advisory Given  Plan Discussed with: Anesthesiologist, CRNA and Surgeon  Anesthesia Plan Comments:          Anesthesia Quick Evaluation

## 2018-03-25 NOTE — Op Note (Signed)
[facility] Gastroenterology Patient Name: [name] Procedure Date: 03/25/2018 7:32 AM MRN: [id number] Account #: [id number] Date of Birth: May 14, 1976 Admit Type: Inpatient Age: 42 Room: [facility] ENDO ROOM 4 Gender: Female Note Status: Finalized Procedure:            Colonoscopy Indications:          Hematochezia Providers:            Benay Pike. [place] MD, MD Medicines:            Propofol per Anesthesia Complications:        No immediate complications. Procedure:            Pre-Anesthesia Assessment:                       - The risks and benefits of the procedure and the                        sedation options and risks were discussed with the                        patient. All questions were answered and informed                        consent was obtained.                       - Patient identification and proposed procedure were                        verified prior to the procedure by the nurse. The                        procedure was verified in the procedure room.                       - ASA Grade Assessment: II - A patient with mild                        systemic disease.                       - After reviewing the risks and benefits, the patient                        was deemed in satisfactory condition to undergo the                        procedure.                       After obtaining informed consent, the colonoscope was                        passed under direct vision. Throughout the procedure,                        the patient's blood pressure, pulse, and oxygen                        saturations were monitored continuously. The  Colonoscope was introduced through the anus and                        advanced to the the cecum, identified by appendiceal                        orifice and ileocecal valve. The colonoscopy was                        performed without difficulty. The patient tolerated the      procedure well. The quality of the bowel preparation                        was good. Anatomical landmarks were photographed. Findings:      The perianal exam findings include non-thrombosed external hemorrhoids       and internal hemorrhoids that prolapse with straining, but spontaneously       regress to the resting position (Grade II).      Non-bleeding internal hemorrhoids were found during retroflexion. The       hemorrhoids were Grade I (internal hemorrhoids that do not prolapse).      The exam was otherwise without abnormality. Impression:           - Non-thrombosed external hemorrhoids and internal                        hemorrhoids that prolapse with straining, but                        spontaneously regress to the resting position (Grade                        II) found on perianal exam.                       - Non-bleeding internal hemorrhoids.                       - The examination was otherwise normal.                       - No specimens collected. Recommendation:       - Return patient to hospital ward for possible                        discharge same day.                       - Resume previous diet.                       - Continue present medications.                       - Repeat colonoscopy at age 29 for screening purposes.                       - Return to my office PRN. Procedure Code(s):    --- Professional ---                       [id number], Colonoscopy, flexible; diagnostic, including  collection of specimen(s) by brushing or washing, when                        performed (separate procedure) Diagnosis Code(s):    --- Professional ---                       K92.1, Melena (includes Hematochezia)                       K64.4, Residual hemorrhoidal skin tags                       K64.1, Second degree hemorrhoids CPT copyright 2017 American Medical Association. All rights reserved. The codes documented in this report are preliminary and upon  coder review may  be revised to meet current compliance requirements. Efrain Sella MD, MD 03/25/2018 8:34:01 AM This report has been signed electronically. Number of Addenda: 0 Note Initiated On: 03/25/2018 7:32 AM Scope Withdrawal Time: 0 hours 4 minutes 52 seconds  Total Procedure Duration: 0 hours 7 minutes 34 seconds       [facility]

## 2018-03-25 NOTE — Op Note (Signed)
[facility] Gastroenterology Patient Name: [name] Procedure Date: 03/25/2018 7:32 AM MRN: [id number] Account #: [id number] Date of Birth: 1976/05/03 Admit Type: Inpatient Age: 42 Room: [facility] ENDO ROOM 4 Gender: Female Note Status: Finalized Procedure:            Upper GI endoscopy Indications:          Epigastric abdominal pain, Hematochezia Providers:            Benay Pike. Edyn Qazi MD, MD Medicines:            Propofol per Anesthesia Complications:        No immediate complications. Procedure:            Pre-Anesthesia Assessment:                       - The risks and benefits of the procedure and the                        sedation options and risks were discussed with the                        patient. All questions were answered and informed                        consent was obtained.                       - Patient identification and proposed procedure were                        verified prior to the procedure by the nurse. The                        procedure was verified in the procedure room.                       - ASA Grade Assessment: II - A patient with mild                        systemic disease.                       - After reviewing the risks and benefits, the patient                        was deemed in satisfactory condition to undergo the                        procedure.                       After obtaining informed consent, the endoscope was                        passed under direct vision. Throughout the procedure,                        the patient's blood pressure, pulse, and oxygen                        saturations were monitored continuously. The Endoscope  was introduced through the mouth, and advanced to the                        third part of duodenum. The upper GI endoscopy was                        accomplished without difficulty. The patient tolerated                        the procedure  well. Findings:      The examined esophagus was normal.      Localized mildly erythematous mucosa without bleeding was found in the       gastric antrum. Biopsies were taken with a cold forceps for Helicobacter       pylori testing.      The cardia and gastric fundus were normal on retroflexion.      The examined duodenum was normal. Impression:           - Normal esophagus.                       - Erythematous mucosa in the antrum. Biopsied.                       - Normal examined duodenum. Recommendation:       - Await pathology results.                       - Proceed with colonoscopy Procedure Code(s):    --- Professional ---                       [phone], Esophagogastroduodenoscopy, flexible, transoral;                        with biopsy, single or multiple Diagnosis Code(s):    --- Professional ---                       K92.1, Melena (includes Hematochezia)                       R10.13, Epigastric pain                       K31.89, Other diseases of stomach and duodenum CPT copyright 2017 American Medical Association. All rights reserved. The codes documented in this report are preliminary and upon coder review may  be revised to meet current compliance requirements. Efrain Sella MD, MD 03/25/2018 8:21:29 AM This report has been signed electronically. Number of Addenda: 0 Note Initiated On: 03/25/2018 7:32 AM      [facility]

## 2018-03-25 NOTE — Anesthesia Postprocedure Evaluation (Signed)
Anesthesia Post Note  Patient: [name]  Procedure(s) Performed: ESOPHAGOGASTRODUODENOSCOPY (EGD) WITH PROPOFOL (N/A ) COLONOSCOPY WITH PROPOFOL (N/A )  Patient location during evaluation: Endoscopy Anesthesia Type: General Level of consciousness: awake and alert Pain management: pain level controlled Vital Signs Assessment: post-procedure vital signs reviewed and stable Respiratory status: spontaneous breathing, nonlabored ventilation, respiratory function stable and patient connected to nasal cannula oxygen Cardiovascular status: blood pressure returned to baseline and stable Postop Assessment: no apparent nausea or vomiting Anesthetic complications: no     Last Vitals:  Vitals:   03/25/18 0846 03/25/18 0856  BP: (!) 102/59 112/64  Pulse: 73 78  Resp: 15 20  Temp:    SpO2: 100% 99%    Last Pain:  Vitals:   03/25/18 0856  TempSrc:   PainSc: 0-No pain                 Martha Clan

## 2018-03-26 ENCOUNTER — Encounter: Payer: Self-pay | Admitting: Gastroenterology

## 2018-03-26 LAB — H. PYLORI ANTIGEN, STOOL: H. Pylori Stool Ag, Eia: NEGATIVE

## 2018-03-29 ENCOUNTER — Telehealth: Payer: Self-pay

## 2018-03-29 ENCOUNTER — Encounter: Payer: Self-pay | Admitting: Internal Medicine

## 2018-03-29 LAB — SURGICAL PATHOLOGY

## 2018-03-29 NOTE — Telephone Encounter (Signed)
Flagged on EMMI report for not having a follow up scheduled.  Called and spoke with patient. She mentioned she does not have insurance coverage at this time, but is trying to apply for Medicaid.  She has concerns about seeing GI specialist with [facility] due to no coverage and asked about alternatives.  I checked with [facility] as they accept [facility].  After speaking with Patty there, confirmed patient could call to make an appointment and did not need to have [facility] in place for first visit.  Patty mentioned there would be a $50 copay for the visit, however patient could pay what they could and that they would bill for the rest.  She also mentioned they could give [facility] application to patient.  Called and relayed this to patient, along with the number to call to schedule appointment with [facility].

## 2018-04-07 ENCOUNTER — Other Ambulatory Visit: Payer: Self-pay

## 2018-04-07 ENCOUNTER — Ambulatory Visit (INDEPENDENT_AMBULATORY_CARE_PROVIDER_SITE_OTHER): Payer: Self-pay | Admitting: Gastroenterology

## 2018-04-07 ENCOUNTER — Encounter: Payer: Self-pay | Admitting: Gastroenterology

## 2018-04-07 VITALS — BP 129/86 | HR 80 | Resp 17 | Wt 154.4 lb

## 2018-04-07 DIAGNOSIS — R1013 Epigastric pain: Secondary | ICD-10-CM

## 2018-04-07 DIAGNOSIS — K625 Hemorrhage of anus and rectum: Secondary | ICD-10-CM

## 2018-04-07 DIAGNOSIS — K640 First degree hemorrhoids: Secondary | ICD-10-CM

## 2018-04-07 NOTE — Progress Notes (Signed)
PROCEDURE NOTE: The patient presents with symptomatic grade 1 hemorrhoids, unresponsive to maximal medical therapy, requesting rubber band ligation of his/her hemorrhoidal disease.  All risks, benefits and alternative forms of therapy were described and informed consent was obtained.  In the Left Lateral Decubitus position (if anoscopy is performed) anoscopic examination revealed grade 1 hemorrhoids in the all position(s).   The decision was made to band the RA internal hemorrhoid, and the [place] was used to perform band ligation without complication.  Digital anorectal examination was then performed to assure proper positioning of the band, and to adjust the banded tissue as required.  The patient was discharged home without pain or other issues.  Dietary and behavioral recommendations were given and (if necessary - prescriptions were given), along with follow-up instructions.  The patient will return 2 weeks for follow-up and possible additional banding as required.  Nitropaste was applied after the banding procedure and patient developed presyncope. Vitals were rechecked prior to discharge and they were normal, patient was asymptomatic  Cephas Darby, MD [address]  [address]  [place], [place] [address]  Main: [phone]  Fax: [phone] Pager: [phone]

## 2018-04-07 NOTE — Progress Notes (Signed)
Jasmine Darby, MD [address]  [place]  [place], [place] [address]  Main: [phone]  Fax: [phone]    Gastroenterology Consultation  Referring Provider:     [name], NP Primary Care Physician:  [name], NP Primary Gastroenterologist:  Dr. Cephas Hartman Reason for Consultation:     Dr Vicente Males        HPI:   [name] is a 42 y.o. female referred by Dr. [name], NP  for consultation & management of rectal bleeding. She was recently admitted to [facility] due to recurrent episodes of rectal bleeding and epigastric pain with remote history of long-term NSAID use. She underwent EGD and colonoscopy. EGD was unremarkable and gastric biopsies negative for H. Pylori. H. Pylori stool antigen was also negative. Colonoscopy revealed both internal and external hemorrhoids otherwise unremarkable.   Interval summary: She continues to have ongoing episodes of rectal bleeding. She reports that the bleeding subsided after she was discharged. Just started noticing rectal bleeding since yesterday. She showed me the pictures of her stool. Her stools are brown and formed, part of the stool was covered with blood. And, bright red blood on the toilet paper. She does report perianal itching, sharp pain, discomfort. She does report episodes of constipation, prolonged sitting on the toilet for about 15 minutes, sensation of incomplete emptying. She does smoke tobacco. She also reports ongoing epigastric pain, bloating. She is started on Protonix 40 mg twice daily.  NSAIDs: none  Antiplts/Anticoagulants/Anti thrombotics: none  GI Procedures: EGD and colonoscopy in 03/2018 by Dr. Alice Reichert EGD unremarkable Colonoscopy revealed internal and external hemorrhoids  Past Medical History:  Diagnosis Date  . Depression   . 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 Leukocytosis   . Tobacco use     Past Surgical History:  Procedure  Laterality Date  . BREAST BIOPSY Right 2016   [place] core, fat necrosis  . COLONOSCOPY WITH PROPOFOL N/A 03/25/2018   Procedure: COLONOSCOPY WITH PROPOFOL;  Surgeon: Toledo, Benay Pike, MD;  Location: [facility] ENDOSCOPY;  Service: Gastroenterology;  Laterality: N/A;  . ESOPHAGOGASTRODUODENOSCOPY (EGD) WITH PROPOFOL N/A 03/25/2018   Procedure: ESOPHAGOGASTRODUODENOSCOPY (EGD) WITH PROPOFOL;  Surgeon: Toledo, Benay Pike, MD;  Location: [facility] ENDOSCOPY;  Service: Gastroenterology;  Laterality: N/A;  . KNEE CARTILAGE SURGERY Right 02/2015  . LAPAROSCOPIC TUBAL LIGATION      Current Outpatient Medications:  Marland Kitchen  Multiple Vitamins-Minerals (MULTIVITAMIN WITH MINERALS) tablet, Take 1 tablet by mouth at bedtime., Disp: , Rfl:  .  omega-3 acid ethyl esters (LOVAZA) 1 g capsule, Take 1 g by mouth at bedtime., Disp: , Rfl:  .  pantoprazole (PROTONIX) 40 MG tablet, Take 1 tablet (40 mg total) by mouth 2 (two) times daily., Disp: 60 tablet, Rfl: 1 .  FLUoxetine (PROZAC) 40 MG capsule, Take 40 mg by mouth daily., Disp: , Rfl:  .  meloxicam (MOBIC) 15 MG tablet, TAKE 1 TABLET BY MOUTH ONCE DAILY FOR PAIN, Disp: , Rfl: 3 .  nicotine (NICODERM CQ - DOSED IN MG/24 HOURS) 21 mg/24hr patch, Place 1 patch (21 mg total) onto the skin daily. (Patient not taking: Reported on 04/07/2018), Disp: 28 patch, Rfl: 0    Family History  Problem Relation Age of Onset  . Prostate cancer Father   . Breast cancer Maternal Grandmother 73     Social History   Tobacco Use  . Smoking status: Current Every Day Smoker    Packs/day: 1.00  Years: 7.00    Pack years: 7.00    Types: Cigarettes  . Smokeless tobacco: Never Used  Substance Use Topics  . Alcohol use: No    Alcohol/week: 0.0 oz  . Drug use: No    Allergies as of 04/07/2018  . (No Known Allergies)    Review of Systems:    All systems reviewed and negative except where noted in HPI.   Physical Exam:  BP 129/86 (BP Location: Left Arm, Patient Position: Sitting, Cuff  Size: Normal)   Pulse 80   Resp 17   Wt 154 lb 6.4 oz (70 kg)   BMI 28.24 kg/m  No LMP recorded.  General:   Alert,  Well-developed, well-nourished, pleasant and cooperative in NAD Head:  Normocephalic and atraumatic. Eyes:  Sclera clear, no icterus.   Conjunctiva pink. Ears:  Normal auditory acuity. Nose:  No deformity, discharge, or lesions. Mouth:  No deformity or lesions,oropharynx pink & moist. Neck:  Supple; no masses or thyromegaly. Lungs:  Respirations even and unlabored.  Clear throughout to auscultation.   No wheezes, crackles, or rhonchi. No acute distress. Heart:  Regular rate and rhythm; no murmurs, clicks, rubs, or gallops. Abdomen:  Normal bowel sounds. Soft, non-tender and non-distended without masses, hepatosplenomegaly or hernias noted.  No guarding or rebound tenderness.   Rectal: formed stool coated with dark red blood, moderate tenderness in the anal canal with concern for anal fissure, large hemorrhoids both internal and external Msk:  Symmetrical without gross deformities. Good, equal movement & strength bilaterally. Pulses:  Normal pulses noted. Extremities:  No clubbing or edema.  No cyanosis. Neurologic:  Alert and oriented x3;  grossly normal neurologically. Skin: several tattoos on her body, Intact without significant lesions or rashes. No jaundice. Lymph Nodes:  No significant cervical adenopathy. Psych:  Alert and cooperative. Normal mood and affect.  Imaging Studies: CT A/P in 03/2018 revealed lesions in the right lobe of the liver consistent with focal nodular hyperplasia  Assessment and Plan:   [name] is a 42 y.o. Caucasian female with chronic tobacco use, with intermittent rectal bleeding, and constipation, functional dyspepsia  Rectal bleeding: secondary to hemorrhoids Colonoscopy was unremarkable except for internal and external hemorrhoids Discussed with her about outpatient hemorrhoid ligation and she agreed to undergo Consent  obtained Performed hemorrhoid ligation today, repeat in 2 weeks She probably has anal fissure as well. Patient developed presyncope after applying nitroglycerin paste per rectum  Constipation: Discussed with her about high-fiber diet and fiber supplements Education material provided  Dyspepsia: likely functional EGD unremarkable, no evidence of H. Pylori or peptic ulcer disease Trial of FD guard Encouraged her to quit smoking   Follow up in 2 weeks   Jasmine Darby, MD

## 2018-04-07 NOTE — Patient Instructions (Signed)
High-Fiber Diet  Fiber, also called dietary fiber, is a type of carbohydrate found in fruits, vegetables, whole grains, and beans. A high-fiber diet can have many health benefits. Your health care provider may recommend a high-fiber diet to help:  · Prevent constipation. Fiber can make your bowel movements more regular.  · Lower your cholesterol.  · Relieve hemorrhoids, uncomplicated diverticulosis, or irritable bowel syndrome.  · Prevent overeating as part of a weight-loss plan.  · Prevent heart disease, type 2 diabetes, and certain cancers.    What is my plan?  The recommended daily intake of fiber includes:  · 38 grams for men under age 50.  · 30 grams for men over age 50.  · 25 grams for women under age 50.  · 21 grams for women over age 50.    You can get the recommended daily intake of dietary fiber by eating a variety of fruits, vegetables, grains, and beans. Your health care provider may also recommend a fiber supplement if it is not possible to get enough fiber through your diet.  What do I need to know about a high-fiber diet?  · Fiber supplements have not been widely studied for their effectiveness, so it is better to get fiber through food sources.  · Always check the fiber content on the nutrition facts label of any prepackaged food. Look for foods that contain at least 5 grams of fiber per serving.  · Ask your dietitian if you have questions about specific foods that are related to your condition, especially if those foods are not listed in the following section.  · Increase your daily fiber consumption gradually. Increasing your intake of dietary fiber too quickly may cause bloating, cramping, or gas.  · Drink plenty of water. Water helps you to digest fiber.  What foods can I eat?  Grains  Whole-grain breads. Multigrain cereal. Oats and oatmeal. Brown rice. Barley. Bulgur wheat. Millet. Bran muffins. Popcorn. Rye wafer crackers.  Vegetables   Sweet potatoes. Spinach. Kale. Artichokes. Cabbage. Broccoli. Herberto Ledwell peas. Carrots. Squash.  Fruits  Berries. Pears. Apples. Oranges. Avocados. Prunes and raisins. Dried figs.  Meats and Other Protein Sources  Navy, kidney, pinto, and soy beans. Split peas. Lentils. Nuts and seeds.  Dairy  Fiber-fortified yogurt.  Beverages  Fiber-fortified soy milk. Fiber-fortified orange juice.  Other  Fiber bars.  The items listed above may not be a complete list of recommended foods or beverages. Contact your dietitian for more options.  What foods are not recommended?  Grains  White bread. Pasta made with refined flour. White rice.  Vegetables  Fried potatoes. Canned vegetables. Well-cooked vegetables.  Fruits  Fruit juice. Cooked, strained fruit.  Meats and Other Protein Sources  Fatty cuts of meat. Fried poultry or fried fish.  Dairy  Milk. Yogurt. Cream cheese. Sour cream.  Beverages  Soft drinks.  Other  Cakes and pastries. Butter and oils.  The items listed above may not be a complete list of foods and beverages to avoid. Contact your dietitian for more information.  What are some tips for including high-fiber foods in my diet?  · Eat a wide variety of high-fiber foods.  · Make sure that half of all grains consumed each day are whole grains.  · Replace breads and cereals made from refined flour or white flour with whole-grain breads and cereals.  · Replace white rice with brown rice, bulgur wheat, or millet.  · Start the day with a breakfast that is high in fiber,   such as a cereal that contains at least 5 grams of fiber per serving.  · Use beans in place of meat in soups, salads, or pasta.  · Eat high-fiber snacks, such as berries, raw vegetables, nuts, or popcorn.  This information is not intended to replace advice given to you by your health care provider. Make sure you discuss any questions you have with your health care provider.  Document Released: 09/08/2005 Document Revised: 02/14/2016 Document Reviewed: 02/21/2014   Elsevier Interactive Patient Education © 2018 [employer] Inc[employer]

## 2018-04-12 ENCOUNTER — Ambulatory Visit: Payer: Self-pay | Admitting: Gastroenterology

## 2018-04-23 ENCOUNTER — Encounter: Payer: Self-pay | Admitting: Gastroenterology

## 2018-04-23 ENCOUNTER — Ambulatory Visit: Payer: Medicaid Other | Admitting: Gastroenterology

## 2018-04-23 VITALS — BP 122/76 | HR 94 | Ht 62.0 in | Wt 156.4 lb

## 2018-04-23 DIAGNOSIS — K625 Hemorrhage of anus and rectum: Secondary | ICD-10-CM

## 2018-04-23 DIAGNOSIS — K640 First degree hemorrhoids: Secondary | ICD-10-CM

## 2018-04-23 DIAGNOSIS — K30 Functional dyspepsia: Secondary | ICD-10-CM

## 2018-04-23 MED ORDER — OMEPRAZOLE 40 MG PO CPDR: 40.0000 mg | DELAYED_RELEASE_CAPSULE | Freq: Two times a day (BID) | ORAL | 0 refills | Status: DC

## 2018-04-23 NOTE — Patient Instructions (Signed)
High-Fiber Diet  Fiber, also called dietary fiber, is a type of carbohydrate found in fruits, vegetables, whole grains, and beans. A high-fiber diet can have many health benefits. Your health care provider may recommend a high-fiber diet to help:  · Prevent constipation. Fiber can make your bowel movements more regular.  · Lower your cholesterol.  · Relieve hemorrhoids, uncomplicated diverticulosis, or irritable bowel syndrome.  · Prevent overeating as part of a weight-loss plan.  · Prevent heart disease, type 2 diabetes, and certain cancers.    What is my plan?  The recommended daily intake of fiber includes:  · 38 grams for men under age 50.  · 30 grams for men over age 50.  · 25 grams for women under age 50.  · 21 grams for women over age 50.    You can get the recommended daily intake of dietary fiber by eating a variety of fruits, vegetables, grains, and beans. Your health care provider may also recommend a fiber supplement if it is not possible to get enough fiber through your diet.  What do I need to know about a high-fiber diet?  · Fiber supplements have not been widely studied for their effectiveness, so it is better to get fiber through food sources.  · Always check the fiber content on the nutrition facts label of any prepackaged food. Look for foods that contain at least 5 grams of fiber per serving.  · Ask your dietitian if you have questions about specific foods that are related to your condition, especially if those foods are not listed in the following section.  · Increase your daily fiber consumption gradually. Increasing your intake of dietary fiber too quickly may cause bloating, cramping, or gas.  · Drink plenty of water. Water helps you to digest fiber.  What foods can I eat?  Grains  Whole-grain breads. Multigrain cereal. Oats and oatmeal. Brown rice. Barley. Bulgur wheat. Millet. Bran muffins. Popcorn. Rye wafer crackers.  Vegetables   Sweet potatoes. Spinach. Kale. Artichokes. Cabbage. Broccoli. Green peas. Carrots. Squash.  Fruits  Berries. Pears. Apples. Oranges. Avocados. Prunes and raisins. Dried figs.  Meats and Other Protein Sources  Navy, kidney, pinto, and soy beans. Split peas. Lentils. Nuts and seeds.  Dairy  Fiber-fortified yogurt.  Beverages  Fiber-fortified soy milk. Fiber-fortified orange juice.  Other  Fiber bars.  The items listed above may not be a complete list of recommended foods or beverages. Contact your dietitian for more options.  What foods are not recommended?  Grains  White bread. Pasta made with refined flour. White rice.  Vegetables  Fried potatoes. Canned vegetables. Well-cooked vegetables.  Fruits  Fruit juice. Cooked, strained fruit.  Meats and Other Protein Sources  Fatty cuts of meat. Fried poultry or fried fish.  Dairy  Milk. Yogurt. Cream cheese. Sour cream.  Beverages  Soft drinks.  Other  Cakes and pastries. Butter and oils.  The items listed above may not be a complete list of foods and beverages to avoid. Contact your dietitian for more information.  What are some tips for including high-fiber foods in my diet?  · Eat a wide variety of high-fiber foods.  · Make sure that half of all grains consumed each day are whole grains.  · Replace breads and cereals made from refined flour or white flour with whole-grain breads and cereals.  · Replace white rice with brown rice, bulgur wheat, or millet.  · Start the day with a breakfast that is high in fiber,   such as a cereal that contains at least 5 grams of fiber per serving.  · Use beans in place of meat in soups, salads, or pasta.  · Eat high-fiber snacks, such as berries, raw vegetables, nuts, or popcorn.  This information is not intended to replace advice given to you by your health care provider. Make sure you discuss any questions you have with your health care provider.  Document Released: 09/08/2005 Document Revised: 02/14/2016 Document Reviewed: 02/21/2014   Elsevier Interactive Patient Education © 2018 Elsevier Inc[employer]

## 2018-04-23 NOTE — Progress Notes (Signed)
PROCEDURE NOTE: The patient presents with symptomatic grade 1 hemorrhoids, unresponsive to maximal medical therapy, requesting rubber band ligation of his/her hemorrhoidal disease.  All risks, benefits and alternative forms of therapy were described and informed consent was obtained.  The decision was made to band the LL internal hemorrhoid, and the [place] was used to perform band ligation without complication.  Digital anorectal examination was then performed to assure proper positioning of the band, and to adjust the banded tissue as required.  The patient was discharged home without pain or other issues.  Dietary and behavioral recommendations were given and (if necessary - prescriptions were given), along with follow-up instructions.  The patient will return 2 weeks for follow-up and possible additional banding as required.  No complications were encountered and the patient tolerated the procedure well.  Cephas Darby, MD [address]  [place]  [place], [place] [address]  Main: [phone]  Fax: [phone] Pager: [phone]

## 2018-04-23 NOTE — Progress Notes (Signed)
Cephas Darby, MD [address]  [place]  [place], [place] [address]  Main: [phone]  Fax: [phone]    Gastroenterology Consultation  Referring Provider:     [name], NP Primary Care Physician:  [name], NP Primary Gastroenterologist:  Dr. Cephas Darby Reason for Consultation: Rectal bleeding and dyspepsia        HPI:   Ezella Kell is a 42 y.o. female referred by Dr. [name], NP  for consultation & management of rectal bleeding. She was recently admitted to [facility] due to recurrent episodes of rectal bleeding and epigastric pain with remote history of long-term NSAID use. She underwent EGD and colonoscopy. EGD was unremarkable and gastric biopsies negative for H. Pylori. H. Pylori stool antigen was also negative. Colonoscopy revealed both internal and external hemorrhoids otherwise unremarkable.   Interval summary: She continues to have ongoing episodes of rectal bleeding. She reports that the bleeding subsided after she was discharged. Just started noticing rectal bleeding since yesterday. She showed me the pictures of her stool. Her stools are brown and formed, part of the stool was covered with blood. And, bright red blood on the toilet paper. She does report perianal itching, sharp pain, discomfort. She does report episodes of constipation, prolonged sitting on the toilet for about 15 minutes, sensation of incomplete emptying. She does smoke tobacco. She also reports ongoing epigastric pain, bloating. She is started on Protonix 40 mg twice daily.  Follow-up visit 04/23/2018 Patient reports that her rectal bleeding has improved. She noticed some blood on wiping today when she had 2-3 loose bowel movements in the morning. She is taking Protonix  40 minute grams twice daily which significantly helps with her symptoms of dyspepsia. However, patient is unable to afford high co-pay.  NSAIDs:  none  Antiplts/Anticoagulants/Anti thrombotics: none  GI Procedures: EGD and colonoscopy in 03/2018 by Dr. Alice Reichert EGD unremarkable for H. pylori Colonoscopy revealed internal and external hemorrhoids  Past Medical History:  Diagnosis Date  . Depression   . 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 Leukocytosis   . Tobacco use     Past Surgical History:  Procedure Laterality Date  . BREAST BIOPSY Right 2016   [place] core, fat necrosis  . COLONOSCOPY WITH PROPOFOL N/A 03/25/2018   Procedure: COLONOSCOPY WITH PROPOFOL;  Surgeon: Toledo, Benay Pike, MD;  Location: [facility]OSCOPY;  Service: Gastroenterology;  Laterality: N/A;  . ESOPHAGOGASTRODUODENOSCOPY (EGD) WITH PROPOFOL N/A 03/25/2018   Procedure: ESOPHAGOGASTRODUODENOSCOPY (EGD) WITH PROPOFOL;  Surgeon: Toledo, Benay Pike, MD;  Location: [facility] ENDOSCOPY;  Service: Gastroenterology;  Laterality: N/A;  . KNEE CARTILAGE SURGERY Right 02/2015  . LAPAROSCOPIC TUBAL LIGATION      Current Outpatient Medications:  .  meloxicam (MOBIC) 15 MG tablet, TAKE 1 TABLET BY MOUTH ONCE DAILY FOR PAIN, Disp: , Rfl: 3 .  Multiple Vitamins-Minerals (MULTIVITAMIN WITH MINERALS) tablet, Take 1 tablet by mouth at bedtime., Disp: , Rfl:  .  omega-3 acid ethyl esters (LOVAZA) 1 g capsule, Take 1 g by mouth at bedtime., Disp: , Rfl:  .  pantoprazole (PROTONIX) 40 MG tablet, Take 1 tablet (40 mg total) by mouth 2 (two) times daily., Disp: 60 tablet, Rfl: 1 .  FLUoxetine (PROZAC) 40 MG capsule, Take 40 mg by mouth daily., Disp: , Rfl:  .  nicotine (NICODERM CQ - DOSED IN MG/24 HOURS) 21 mg/24hr patch, Place 1 patch (21 mg total) onto the skin daily. (Patient not taking: Reported  on 04/07/2018), Disp: 28 patch, Rfl: 0    Family History  Problem Relation Age of Onset  . Prostate cancer Father   . Breast cancer Maternal Grandmother 12     Social History   Tobacco Use  . Smoking status: Current Every Day Smoker    Packs/day: 1.00    Years: 7.00     Pack years: 7.00    Types: Cigarettes  . Smokeless tobacco: Never Used  Substance Use Topics  . Alcohol use: No    Alcohol/week: 0.0 oz  . Drug use: No    Allergies as of 04/23/2018  . (No Known Allergies)    Review of Systems:    All systems reviewed and negative except where noted in HPI.   Physical Exam:  BP 122/76   Pulse 94   Ht 5\' 2"  (1.575 m)   Wt 156 lb 6.4 oz (70.9 kg)   BMI 28.61 kg/m  No LMP recorded.  General:   Alert,  Well-developed, well-nourished, pleasant and cooperative in NAD Head:  Normocephalic and atraumatic. Eyes:  Sclera clear, no icterus.   Conjunctiva pink. Ears:  Normal auditory acuity. Nose:  No deformity, discharge, or lesions. Mouth:  No deformity or lesions,oropharynx pink & moist. Neck:  Supple; no masses or thyromegaly. Lungs:  Respirations even and unlabored.  Clear throughout to auscultation.   No wheezes, crackles, or rhonchi. No acute distress. Heart:  Regular rate and rhythm; no murmurs, clicks, rubs, or gallops. Abdomen:  Normal bowel sounds. Soft, non-tender and non-distended without masses, hepatosplenomegaly or hernias noted.  No guarding or rebound tenderness.   Rectal: not performed today Msk:  Symmetrical without gross deformities. Good, equal movement & strength bilaterally. Pulses:  Normal pulses noted. Extremities:  No clubbing or edema.  No cyanosis. Neurologic:  Alert and oriented x3;  grossly normal neurologically. Skin: several tattoos on her body, Intact without significant lesions or rashes. No jaundice. Lymph Nodes:  No significant cervical adenopathy. Psych:  Alert and cooperative. Normal mood and affect.  Imaging Studies: CT A/P in 03/2018 revealed lesions in the right lobe of the liver consistent with focal nodular hyperplasia  Assessment and Plan:   Emberley Kral is a 42 y.o. Caucasian female with chronic tobacco use, with intermittent rectal bleeding, and constipation, functional dyspepsia  Rectal  bleeding: secondary to hemorrhoids Colonoscopy was unremarkable except for internal and external hemorrhoids Performed repeat hemorrhoid ligation today, repeat in 2 weeks She probably has anal fissure as well. Patient developed presyncope after applying nitroglycerin paste per rectum Will try 2% nifedipine per rectum for anal fissure  Constipation: significantly better continue high-fiber diet and fiber supplements Education material provided   Dyspepsia: likely functional EGD unremarkable, no evidence of H. Pylori or peptic ulcer disease Trial of FD guard Switch from Protonix to omeprazole 40mg  twice a day due to cost issues Encouraged her to quit smoking   Follow up in 2 weeks   Cephas Darby, MD

## 2018-05-14 ENCOUNTER — Telehealth: Payer: Self-pay | Admitting: Gastroenterology

## 2018-05-14 ENCOUNTER — Encounter: Payer: Self-pay | Admitting: Gastroenterology

## 2018-05-14 ENCOUNTER — Ambulatory Visit: Payer: Medicaid Other | Admitting: Gastroenterology

## 2018-05-14 ENCOUNTER — Other Ambulatory Visit: Payer: Self-pay

## 2018-05-14 VITALS — BP 137/87 | HR 101 | Resp 18 | Ht 62.0 in | Wt 158.0 lb

## 2018-05-14 DIAGNOSIS — K640 First degree hemorrhoids: Secondary | ICD-10-CM

## 2018-05-14 DIAGNOSIS — K30 Functional dyspepsia: Secondary | ICD-10-CM

## 2018-05-14 DIAGNOSIS — K602 Anal fissure, unspecified: Secondary | ICD-10-CM

## 2018-05-14 MED ORDER — PANTOPRAZOLE SODIUM 40 MG PO TBEC: 40.0000 mg | DELAYED_RELEASE_TABLET | Freq: Two times a day (BID) | ORAL | 0 refills | Status: DC

## 2018-05-14 NOTE — Telephone Encounter (Signed)
PT  Needs instructions emailed to her on how to use the cream

## 2018-05-14 NOTE — Progress Notes (Signed)
Cephas Darby, MD [address]  [place]  [place], [place] [address]  Main: [phone]  Fax: [phone]    Gastroenterology Consultation  Referring Provider:     [name], NP Primary Care Physician:  [name], NP Primary Gastroenterologist:  Dr. Cephas Darby Reason for Consultation: Rectal bleeding and dyspepsia        HPI:   [name] is a 42 y.o. female referred by Dr. [name], NP  for consultation & management of rectal bleeding. She was recently admitted to [facility] due to recurrent episodes of rectal bleeding and epigastric pain with remote history of long-term NSAID use. She underwent EGD and colonoscopy. EGD was unremarkable and gastric biopsies negative for H. Pylori. H. Pylori stool antigen was also negative. Colonoscopy revealed both internal and external hemorrhoids otherwise unremarkable.   Interval summary: She continues to have ongoing episodes of rectal bleeding. She reports that the bleeding subsided after she was discharged. Just started noticing rectal bleeding since yesterday. She showed me the pictures of her stool. Her stools are brown and formed, part of the stool was covered with blood. And, bright red blood on the toilet paper. She does report perianal itching, sharp pain, discomfort. She does report episodes of constipation, prolonged sitting on the toilet for about 15 minutes, sensation of incomplete emptying. She does smoke tobacco. She also reports ongoing epigastric pain, bloating. She is started on Protonix 40 mg twice daily.  Follow-up visit 04/23/2018 Patient reports that her rectal bleeding has improved. She noticed some blood on wiping today when she had 2-3 loose bowel movements in the morning. She is taking Protonix  40mg  twice daily which significantly helps with her symptoms of dyspepsia.  Follow up visit 05/14/2018 Fiber makes her feel bloated. She has one to 2 soft bowel movements  daily. She continues to have residual symptoms of dyspepsia despite being on Protonix twice a day. She is here for follow-up banding. She has cut back on smoking to 1 packet per day   NSAIDs: none  Antiplts/Anticoagulants/Anti thrombotics: none  GI Procedures: EGD and colonoscopy in 03/2018 by Dr. Alice Reichert EGD unremarkable for H. pylori Colonoscopy revealed internal and external hemorrhoids  Past Medical History:  Diagnosis Date  . Depression   . 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 Leukocytosis   . Tobacco use     Past Surgical History:  Procedure Laterality Date  . BREAST BIOPSY Right 2016   [place] core, fat necrosis  . COLONOSCOPY WITH PROPOFOL N/A 03/25/2018   Procedure: COLONOSCOPY WITH PROPOFOL;  Surgeon: Toledo, Benay Pike, MD;  Location: [facility] ENDOSCOPY;  Service: Gastroenterology;  Laterality: N/A;  . ESOPHAGOGASTRODUODENOSCOPY (EGD) WITH PROPOFOL N/A 03/25/2018   Procedure: ESOPHAGOGASTRODUODENOSCOPY (EGD) WITH PROPOFOL;  Surgeon: Toledo, Benay Pike, MD;  Location: [facility] ENDOSCOPY;  Service: Gastroenterology;  Laterality: N/A;  . KNEE CARTILAGE SURGERY Right 02/2015  . LAPAROSCOPIC TUBAL LIGATION      Current Outpatient Medications:  .  FLUoxetine (PROZAC) 40 MG capsule, Take 40 mg by mouth daily., Disp: , Rfl:  .  meloxicam (MOBIC) 15 MG tablet, TAKE 1 TABLET BY MOUTH ONCE DAILY FOR PAIN, Disp: , Rfl: 3 .  pantoprazole (PROTONIX) 40 MG tablet, Take 1 tablet (40 mg total) by mouth 2 (two) times daily before a meal., Disp: 180 tablet, Rfl: 0 .  Multiple Vitamins-Minerals (MULTIVITAMIN WITH MINERALS) tablet, Take 1 tablet by mouth at bedtime., Disp: , Rfl:  .  nicotine (  NICODERM CQ - DOSED IN MG/24 HOURS) 21 mg/24hr patch, Place 1 patch (21 mg total) onto the skin daily. (Patient not taking: Reported on 04/07/2018), Disp: 28 patch, Rfl: 0 .  omega-3 acid ethyl esters (LOVAZA) 1 g capsule, Take 1 g by mouth at bedtime., Disp: , Rfl:     Family History  Problem  Relation Age of Onset  . Prostate cancer Father   . Breast cancer Maternal Grandmother 100     Social History   Tobacco Use  . Smoking status: Current Every Day Smoker    Packs/day: 1.00    Years: 7.00    Pack years: 7.00    Types: Cigarettes  . Smokeless tobacco: Never Used  Substance Use Topics  . Alcohol use: No    Alcohol/week: 0.0 standard drinks  . Drug use: No    Allergies as of 05/14/2018  . (No Known Allergies)    Review of Systems:    All systems reviewed and negative except where noted in HPI.   Physical Exam:  BP 137/87 (BP Location: Left Arm, Patient Position: Sitting, Cuff Size: Large)   Pulse (!) 101   Resp 18   Ht 5\' 2"  (1.575 m)   Wt 158 lb (71.7 kg)   BMI 28.90 kg/m  No LMP recorded.  General:   Alert,  Well-developed, well-nourished, pleasant and cooperative in NAD Head:  Normocephalic and atraumatic. Eyes:  Sclera clear, no icterus.   Conjunctiva pink. Ears:  Normal auditory acuity. Nose:  No deformity, discharge, or lesions. Mouth:  No deformity or lesions,oropharynx pink & moist. Neck:  Supple; no masses or thyromegaly. Lungs:  Respirations even and unlabored.  Clear throughout to auscultation.   No wheezes, crackles, or rhonchi. No acute distress. Heart:  Regular rate and rhythm; no murmurs, clicks, rubs, or gallops. Abdomen:  Normal bowel sounds. Soft, non-tender and non-distended without masses, hepatosplenomegaly or hernias noted.  No guarding or rebound tenderness.   Rectal: revealed severe tenderness in the posterior wall of the anal canal Msk:  Symmetrical without gross deformities. Good, equal movement & strength bilaterally. Pulses:  Normal pulses noted. Extremities:  No clubbing or edema.  No cyanosis. Neurologic:  Alert and oriented x3;  grossly normal neurologically. Skin: several tattoos on her body, Intact without significant lesions or rashes. No jaundice. Lymph Nodes:  No significant cervical adenopathy. Psych:  Alert and  cooperative. Normal mood and affect.  Imaging Studies: CT A/P in 03/2018 revealed lesions in the right lobe of the liver consistent with focal nodular hyperplasia  Assessment and Plan:   [name] is a 42 y.o. Caucasian female with chronic tobacco use, with intermittent rectal bleeding, and constipation, functional dyspepsia  Rectal bleeding: secondary to hemorrhoids Colonoscopy was unremarkable except for internal and external hemorrhoids Status post ligation of right anterior and left lateral hemorrhoids Unable to perform ligation today due to anal fissure Recommend 0.125% nitroglycerin per rectum, discussed precautions with her  Constipation: significantly better continue high-fiber diet    Dyspepsia: likely functional EGD unremarkable, no evidence of H. Pylori or peptic ulcer disease Trial of FD guard Continue Protonix 40 mg twice daily   Follow up in 2 weeks   Cephas Darby, MD

## 2018-05-14 NOTE — Telephone Encounter (Signed)
Spoke with pt ans she has been instructed on how to use cream, pt verbalized understanding

## 2018-05-19 ENCOUNTER — Encounter: Payer: Self-pay | Admitting: Gastroenterology

## 2018-05-21 ENCOUNTER — Ambulatory Visit
Admission: RE | Admit: 2018-05-21 | Discharge: 2018-05-21 | Disposition: A | Discharge status: Home / Self Care | Payer: Disability Insurance | Source: Ambulatory Visit | Attending: Family Medicine | Admitting: Family Medicine

## 2018-05-21 ENCOUNTER — Other Ambulatory Visit: Payer: Self-pay | Admitting: Family Medicine

## 2018-05-21 DIAGNOSIS — M542 Cervicalgia: Secondary | ICD-10-CM | POA: Diagnosis not present

## 2018-05-21 DIAGNOSIS — M5440 Lumbago with sciatica, unspecified side: Secondary | ICD-10-CM | POA: Diagnosis present

## 2018-05-21 DIAGNOSIS — M1990 Unspecified osteoarthritis, unspecified site: Secondary | ICD-10-CM | POA: Diagnosis not present

## 2018-05-21 DIAGNOSIS — M5489 Other dorsalgia: Secondary | ICD-10-CM

## 2018-05-21 DIAGNOSIS — M5136 Other intervertebral disc degeneration, lumbar region: Secondary | ICD-10-CM | POA: Diagnosis not present

## 2018-05-21 DIAGNOSIS — M545 Low back pain: Secondary | ICD-10-CM

## 2018-05-27 ENCOUNTER — Encounter: Payer: Self-pay | Admitting: Gastroenterology

## 2018-06-01 DIAGNOSIS — E278 Other specified disorders of adrenal gland: Secondary | ICD-10-CM | POA: Insufficient documentation

## 2018-06-04 ENCOUNTER — Ambulatory Visit: Payer: Medicaid Other | Admitting: Gastroenterology

## 2018-08-07 ENCOUNTER — Other Ambulatory Visit: Payer: Self-pay

## 2018-08-07 ENCOUNTER — Encounter: Payer: Self-pay | Admitting: Emergency Medicine

## 2018-08-07 ENCOUNTER — Emergency Department
Admission: EM | Admit: 2018-08-07 | Discharge: 2018-08-07 | Disposition: A | Discharge status: Home / Self Care | Payer: Disability Insurance | Attending: Emergency Medicine | Admitting: Emergency Medicine

## 2018-08-07 DIAGNOSIS — M79662 Pain in left lower leg: Secondary | ICD-10-CM

## 2018-08-07 DIAGNOSIS — Z79899 Other long term (current) drug therapy: Secondary | ICD-10-CM | POA: Insufficient documentation

## 2018-08-07 DIAGNOSIS — F17210 Nicotine dependence, cigarettes, uncomplicated: Secondary | ICD-10-CM | POA: Insufficient documentation

## 2018-08-07 MED ORDER — NAPROXEN 500 MG PO TABS: 500.0000 mg | ORAL_TABLET | Freq: Two times a day (BID) | ORAL | 0 refills | Status: DC

## 2018-08-07 MED ORDER — KETOROLAC TROMETHAMINE 30 MG/ML IJ SOLN
30.0000 mg | Freq: Once | INTRAMUSCULAR | Status: AC
  Administered 2018-08-07: 30 mg via INTRAMUSCULAR
  Filled 2018-08-07: qty 1

## 2018-08-07 MED ORDER — METHOCARBAMOL 500 MG PO TABS: 500.0000 mg | ORAL_TABLET | Freq: Two times a day (BID) | ORAL | 0 refills | Status: DC | PRN

## 2018-08-07 NOTE — ED Provider Notes (Signed)
[facility] Emergency Department Provider Note ____________________________________________  Time seen: 0720  I have reviewed the triage vital signs and the nursing notes.  HISTORY  Chief Complaint  Leg Pain   HPI [name] is a 42 y.o. female presents to the ER today with complaint of left calf pain.  She reports she was walking down some steps at approximately 0430 this morning.  She reports she stepped down wrong and felt something pop in her left calf.  She reports she is able to bear weight, but it is very painful.  She is ambulating.  She has not noticed any swelling or bruising.  She denies pain in the left knee or the left ankle.  She denies any prior injury to the left leg.  She did not try anything OTC PTA.  Past Medical History:  Diagnosis Date  . Depression   . 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 Leukocytosis   . Tobacco use     Patient Active Problem List   Diagnosis Date Noted  . GI bleed 03/23/2018  . Leucocytosis 03/04/2016    Past Surgical History:  Procedure Laterality Date  . BREAST BIOPSY Right 2016   [place] core, fat necrosis  . COLONOSCOPY WITH PROPOFOL N/A 03/25/2018   Procedure: COLONOSCOPY WITH PROPOFOL;  Surgeon: Toledo, Benay Pike, MD;  Location: [facility] ENDOSCOPY;  Service: Gastroenterology;  Laterality: N/A;  . ESOPHAGOGASTRODUODENOSCOPY (EGD) WITH PROPOFOL N/A 03/25/2018   Procedure: ESOPHAGOGASTRODUODENOSCOPY (EGD) WITH PROPOFOL;  Surgeon: Toledo, Benay Pike, MD;  Location: [facility] ENDOSCOPY;  Service: Gastroenterology;  Laterality: N/A;  . KNEE CARTILAGE SURGERY Right 02/2015  . LAPAROSCOPIC TUBAL LIGATION      Prior to Admission medications   Medication Sig Start Date End Date Taking? Authorizing Provider  methocarbamol (ROBAXIN) 500 MG tablet Take 1 tablet (500 mg total) by mouth every 12 (twelve) hours as needed for muscle spasms. 08/07/18   Jearld Fenton, NP  Multiple Vitamins-Minerals (MULTIVITAMIN  WITH MINERALS) tablet Take 1 tablet by mouth at bedtime.    [provider]  naproxen (NAPROSYN) 500 MG tablet Take 1 tablet (500 mg total) by mouth 2 (two) times daily with a meal. 08/07/18   Zebedee Segundo, Coralie Keens, NP  omega-3 acid ethyl esters (LOVAZA) 1 g capsule Take 1 g by mouth at bedtime.    [provider]    Allergies Patient has no known allergies.  Family History  Problem Relation Age of Onset  . Prostate cancer Father   . Breast cancer Maternal Grandmother 33    Social History Social History   Tobacco Use  . Smoking status: Current Every Day Smoker    Packs/day: 1.00    Years: 7.00    Pack years: 7.00    Types: Cigarettes  . Smokeless tobacco: Never Used  Substance Use Topics  . Alcohol use: No    Alcohol/week: 0.0 standard drinks  . Drug use: No    Review of Systems  Constitutional: Negative for fever. Cardiovascular: Negative for chest pain. Respiratory: Negative for shortness of breath. Musculoskeletal: Positive for left calf pain or difficulty with gait.  Negative for left knee or ankle pain. Skin: Negative for redness, bruising or swelling. Neurological: Negative for focal weakness, tingling or numbness. ____________________________________________  PHYSICAL EXAM:  VITAL SIGNS: ED Triage Vitals  Enc Vitals Group     BP 08/07/18 0710 (!) 150/92     Pulse Rate 08/07/18 0710 93     Resp 08/07/18 0710 18  Temp 08/07/18 0710 98.2 F (36.8 C)     Temp Source 08/07/18 0710 Oral     SpO2 08/07/18 0710 97 %     Weight 08/07/18 0711 165 lb (74.8 kg)     Height 08/07/18 0711 5\' 2"  (1.575 m)     Head Circumference --      Peak Flow --      Pain Score 08/07/18 0711 8     Pain Loc --      Pain Edu? --      Excl. in [place]? --     Constitutional: Alert and oriented. Well appearing and in no distress. Cardiovascular: Normal rate, regular rhythm.  Pedal pulses 2+ bilaterally. Respiratory: Normal respiratory effort. No  wheezes/rales/rhonchi. Musculoskeletal: Normal flexion and extension of the left knee.  No swelling of the left knee.  No pain with palpation of left knee.  Normal rotation of the left ankle.  No swelling of the left ankle.  No pain with palpation of the left ankle.  Normal plantarflexion and dorsiflexion of the left foot.  No pain with palpation of the Achilles.  Pain with palpation of the distal gastrocnemius on the left.  No calf swelling noted. Neurologic: Sensation intact to BLE. Skin:  Skin is warm, dry and intact. No redness, bruising or swelling noted. ____________________________________________  INITIAL IMPRESSION / ASSESSMENT AND PLAN / ED COURSE  Left Calf Pain:  Likely strain vs incomplete gastrocnemius tear No suspicion for DVT, compartment syndrome Toradol 30 mg IM given in ER eRx for Naproxen 500 mg BID prn eRx for Methocarbamol 500 mg QHS prn Encouraged ice, stretching Discussed continued need for ambulation, but avoid overdoing it Return precautions discussed     I reviewed the patient's prescription history over the last 12 months in the multi-state controlled substances database(s) that includes [place], [place], [place], [place], [place], [place], [place], [place], [place], [place], [place], [place], [place], and [place].  Results were notable for Hydrocodone #6, 11/2017. ____________________________________________  FINAL CLINICAL IMPRESSION(S) / ED DIAGNOSES  Final diagnoses:  Pain of left calf  Webb Silversmith, NP     Jearld Fenton, NP 08/07/18 7124    Orbie Pyo, MD 08/07/18 1520

## 2018-08-07 NOTE — Discharge Instructions (Addendum)
You were seen for left calf pain. This could be just a strain vs gastrocnemius tear. I have given you a RX for anti inflammatories and muscle relaxers. You can apply ice for 10 minutes 3 x day. Do calf stretches and ambulate as tolerated. Follow up if pain persist or worsens, or if you develop swelling, redness or warmth of the left calf.

## 2018-08-07 NOTE — ED Notes (Signed)
See triage note  Presents with left leg pain  States she missed a step this am  Felt a pop to calf  Increased pain with standing

## 2018-08-07 NOTE — ED Triage Notes (Signed)
States at 0430 this am missed step and felt pop and pain L calf.

## 2018-08-23 ENCOUNTER — Emergency Department
Admission: EM | Admit: 2018-08-23 | Discharge: 2018-08-23 | Disposition: A | Discharge status: Home / Self Care | Payer: Disability Insurance | Attending: Emergency Medicine | Admitting: Emergency Medicine

## 2018-08-23 ENCOUNTER — Encounter: Payer: Self-pay | Admitting: Emergency Medicine

## 2018-08-23 DIAGNOSIS — M79604 Pain in right leg: Secondary | ICD-10-CM

## 2018-08-23 DIAGNOSIS — Y939 Activity, unspecified: Secondary | ICD-10-CM | POA: Insufficient documentation

## 2018-08-23 DIAGNOSIS — S8011XD Contusion of right lower leg, subsequent encounter: Secondary | ICD-10-CM | POA: Insufficient documentation

## 2018-08-23 DIAGNOSIS — Z79899 Other long term (current) drug therapy: Secondary | ICD-10-CM | POA: Insufficient documentation

## 2018-08-23 DIAGNOSIS — X58XXXD Exposure to other specified factors, subsequent encounter: Secondary | ICD-10-CM | POA: Insufficient documentation

## 2018-08-23 DIAGNOSIS — F17210 Nicotine dependence, cigarettes, uncomplicated: Secondary | ICD-10-CM | POA: Insufficient documentation

## 2018-08-23 DIAGNOSIS — Y998 Other external cause status: Secondary | ICD-10-CM | POA: Insufficient documentation

## 2018-08-23 DIAGNOSIS — Y929 Unspecified place or not applicable: Secondary | ICD-10-CM | POA: Insufficient documentation

## 2018-08-23 DIAGNOSIS — T148XXA Other injury of unspecified body region, initial encounter: Secondary | ICD-10-CM

## 2018-08-23 NOTE — ED Triage Notes (Signed)
Says injury last week to left leg.  Now says ankle is getting worse.  Says bruising around ankle and able only put slight wt on that leg.

## 2018-08-23 NOTE — ED Provider Notes (Signed)
[facility] Emergency Department Provider Note   ____________________________________________    I have reviewed the triage vital signs and the nursing notes.   HISTORY  Chief Complaint Leg Pain     HPI [name] is a 42 y.o. female who presents for reevaluation of left calf injury.  Seen 2 weeks ago for calf injury thought to be muscle injury.  Patient has been gradually improving but reports bruising has now extended all the way down to her to her ankle and she is concerned by this.  Denies new injuries.  No blood thinners   Past Medical History:  Diagnosis Date  . Depression   . 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 Leukocytosis   . Tobacco use     Patient Active Problem List   Diagnosis Date Noted  . GI bleed 03/23/2018  . Leucocytosis 03/04/2016    Past Surgical History:  Procedure Laterality Date  . BREAST BIOPSY Right 2016   [place] core, fat necrosis  . COLONOSCOPY WITH PROPOFOL N/A 03/25/2018   Procedure: COLONOSCOPY WITH PROPOFOL;  Surgeon: Toledo, Benay Pike, MD;  Location: [facility] ENDOSCOPY;  Service: Gastroenterology;  Laterality: N/A;  . ESOPHAGOGASTRODUODENOSCOPY (EGD) WITH PROPOFOL N/A 03/25/2018   Procedure: ESOPHAGOGASTRODUODENOSCOPY (EGD) WITH PROPOFOL;  Surgeon: Toledo, Benay Pike, MD;  Location: [facility] ENDOSCOPY;  Service: Gastroenterology;  Laterality: N/A;  . KNEE CARTILAGE SURGERY Right 02/2015  . LAPAROSCOPIC TUBAL LIGATION      Prior to Admission medications   Medication Sig Start Date End Date Taking? Authorizing Provider  methocarbamol (ROBAXIN) 500 MG tablet Take 1 tablet (500 mg total) by mouth every 12 (twelve) hours as needed for muscle spasms. 08/07/18   Jearld Fenton, NP  Multiple Vitamins-Minerals (MULTIVITAMIN WITH MINERALS) tablet Take 1 tablet by mouth at bedtime.    [provider]  naproxen (NAPROSYN) 500 MG tablet Take 1 tablet (500 mg total) by mouth 2 (two) times daily  with a meal. 08/07/18   Baity, Coralie Keens, NP  omega-3 acid ethyl esters (LOVAZA) 1 g capsule Take 1 g by mouth at bedtime.    [provider]     Allergies Patient has no known allergies.  Family History  Problem Relation Age of Onset  . Prostate cancer Father   . Breast cancer Maternal Grandmother 32    Social History Social History   Tobacco Use  . Smoking status: Current Every Day Smoker    Packs/day: 1.00    Years: 7.00    Pack years: 7.00    Types: Cigarettes  . Smokeless tobacco: Never Used  Substance Use Topics  . Alcohol use: No    Alcohol/week: 0.0 standard drinks  . Drug use: No    Review of Systems  Constitutional: No fever/chills      Musculoskeletal: Improving calf pain, Skin: Bruising as above Neurological: Negative for numbness    ____________________________________________   PHYSICAL EXAM:  VITAL SIGNS: ED Triage Vitals  Enc Vitals Group     BP 08/23/18 1226 (!) 143/89     Pulse Rate 08/23/18 1226 100     Resp --      Temp 08/23/18 1226 98.3 F (36.8 C)     Temp Source 08/23/18 1226 Oral     SpO2 08/23/18 1226 100 %     Weight 08/23/18 1222 74.8 kg (164 lb 14.5 oz)     Height 08/23/18 1222 1.575 m (5\' 2" )     Head Circumference --  Peak Flow --      Pain Score 08/23/18 1222 5     Pain Loc --      Pain Edu? --      Excl. in [place]? --      Constitutional: Alert and oriented. No acute distress.   Nose: No congestion/rhinnorhea. Mouth/Throat: Mucous membranes are moist.   Cardiovascular: Normal rate, regular rhythm.  Respiratory: Normal respiratory effort.  No retractions. Genitourinary: deferred Musculoskeletal: Left calf, ecchymosis medial aspect about midway down the lower leg, some bruising extending down to the ankle, compartments otherwise soft Neurologic:  Normal speech and language. No gross focal neurologic deficits are appreciated.   Skin:  Skin is warm, dry and intact.     ____________________________________________   LABS (all labs ordered are listed, but only abnormal results are displayed)  Labs Reviewed - No data to display ____________________________________________  EKG   ____________________________________________  RADIOLOGY   ____________________________________________   PROCEDURES  Procedure(s) performed: No  Procedures   Critical Care performed: No ____________________________________________   INITIAL IMPRESSION / ASSESSMENT AND PLAN / ED COURSE  Pertinent labs & imaging results that were available during my care of the patient were reviewed by me and considered in my medical decision making (see chart for details).  Reassurance provided no bruising likely related to movement of blood and not a new injury continue supportive care, orthopedic follow-up if no improvement   ____________________________________________   FINAL CLINICAL IMPRESSION(S) / ED DIAGNOSES  Final diagnoses:  Right leg pain  Hematoma      NEW MEDICATIONS STARTED DURING THIS VISIT:  Discharge Medication List as of 08/23/2018 12:48 PM       Note:  This document was prepared using Dragon voice recognition software and may include unintentional dictation errors.    Lavonia Drafts, MD 08/23/18 [phone]

## 2019-02-02 ENCOUNTER — Ambulatory Visit: Payer: Disability Insurance

## 2019-02-19 ENCOUNTER — Encounter: Payer: Self-pay | Admitting: Gastroenterology

## 2019-02-20 ENCOUNTER — Inpatient Hospital Stay: Admission: RE | Admit: 2019-02-20 | Payer: Disability Insurance | Source: Ambulatory Visit

## 2019-02-20 ENCOUNTER — Emergency Department: Payer: Self-pay

## 2019-02-20 ENCOUNTER — Other Ambulatory Visit: Payer: Self-pay

## 2019-02-20 ENCOUNTER — Emergency Department
Admission: EM | Admit: 2019-02-20 | Discharge: 2019-02-20 | Disposition: A | Discharge status: Home / Self Care | Payer: Self-pay | Attending: Emergency Medicine | Admitting: Emergency Medicine

## 2019-02-20 DIAGNOSIS — F17210 Nicotine dependence, cigarettes, uncomplicated: Secondary | ICD-10-CM | POA: Insufficient documentation

## 2019-02-20 DIAGNOSIS — F329 Major depressive disorder, single episode, unspecified: Secondary | ICD-10-CM | POA: Insufficient documentation

## 2019-02-20 DIAGNOSIS — K625 Hemorrhage of anus and rectum: Secondary | ICD-10-CM | POA: Insufficient documentation

## 2019-02-20 DIAGNOSIS — R109 Unspecified abdominal pain: Secondary | ICD-10-CM | POA: Insufficient documentation

## 2019-02-20 LAB — CBC
HCT: 45.8 % (ref 36.0–46.0)
Hemoglobin: 15.7 g/dL — ABNORMAL HIGH (ref 12.0–15.0)
MCH: 30.8 pg (ref 26.0–34.0)
MCHC: 34.3 g/dL (ref 30.0–36.0)
MCV: 89.8 fL (ref 80.0–100.0)
Platelets: 518 10*3/uL — ABNORMAL HIGH (ref 150–400)
RBC: 5.1 MIL/uL (ref 3.87–5.11)
RDW: 13.4 % (ref 11.5–15.5)
WBC: 21.8 10*3/uL — ABNORMAL HIGH (ref 4.0–10.5)
nRBC: 0 % (ref 0.0–0.2)

## 2019-02-20 LAB — COMPREHENSIVE METABOLIC PANEL
ALT: 33 U/L (ref 0–44)
AST: 26 U/L (ref 15–41)
Albumin: 4.7 g/dL (ref 3.5–5.0)
Alkaline Phosphatase: 84 U/L (ref 38–126)
Anion gap: 10 (ref 5–15)
BUN: 8 mg/dL (ref 6–20)
CO2: 26 mmol/L (ref 22–32)
Calcium: 9.2 mg/dL (ref 8.9–10.3)
Chloride: 101 mmol/L (ref 98–111)
Creatinine, Ser: 0.59 mg/dL (ref 0.44–1.00)
GFR calc Af Amer: >60 mL/min (ref >60)
GFR calc non Af Amer: >60 mL/min (ref >60)
Glucose, Bld: 78 mg/dL (ref 70–99)
Potassium: 4 mmol/L (ref 3.5–5.1)
Sodium: 137 mmol/L (ref 135–145)
Total Bilirubin: 0.6 mg/dL (ref 0.3–1.2)
Total Protein: 8.1 g/dL (ref 6.5–8.1)

## 2019-02-20 LAB — HCG, QUANTITATIVE, PREGNANCY: hCG, Beta Chain, Quant, S: <1 m[IU]/mL (ref <5)

## 2019-02-20 MED ORDER — AMOXICILLIN-POT CLAVULANATE 875-125 MG PO TABS
1.0000 | ORAL_TABLET | Freq: Once | ORAL | Status: AC
  Administered 2019-02-20: 1 via ORAL
  Filled 2019-02-20: qty 1

## 2019-02-20 MED ORDER — ONDANSETRON HCL 4 MG/2ML IJ SOLN
4.0000 mg | Freq: Once | INTRAMUSCULAR | Status: AC
  Administered 2019-02-20: 4 mg via INTRAVENOUS
  Filled 2019-02-20: qty 2

## 2019-02-20 MED ORDER — IOHEXOL 300 MG/ML  SOLN
100.0000 mL | Freq: Once | INTRAMUSCULAR | Status: AC | PRN
  Administered 2019-02-20: 100 mL via INTRAVENOUS

## 2019-02-20 MED ORDER — IOHEXOL 240 MG/ML SOLN
50.0000 mL | Freq: Once | INTRAMUSCULAR | Status: AC | PRN
  Administered 2019-02-20: 50 mL via ORAL

## 2019-02-20 MED ORDER — HYDROCODONE-ACETAMINOPHEN 5-325 MG PO TABS: 1.0000 | ORAL_TABLET | ORAL | 0 refills | Status: DC | PRN

## 2019-02-20 MED ORDER — AMOXICILLIN-POT CLAVULANATE 875-125 MG PO TABS: 1.0000 | ORAL_TABLET | Freq: Two times a day (BID) | ORAL | 0 refills | Status: DC

## 2019-02-20 MED ORDER — MORPHINE SULFATE (PF) 4 MG/ML IV SOLN
4.0000 mg | Freq: Once | INTRAVENOUS | Status: AC
  Administered 2019-02-20: 4 mg via INTRAVENOUS
  Filled 2019-02-20: qty 1

## 2019-02-20 NOTE — ED Notes (Addendum)
Pt with c/o blood in toilet with BMs. Hemorrhoids noted in rectum, no blood noted at present time. No c/o nausea or vomiting. Also c/o abdominal pain.

## 2019-02-20 NOTE — Discharge Instructions (Signed)
As we discussed please take your antibiotics as prescribed, pain medication as needed.  Please follow-up with Dr. Alice Reichert by calling the number provided for your gastroenterologist.  Return to the emergency department for any significant bleeding, worsening abdominal pain development of fever, or any other symptom personally concerning to yourself.

## 2019-02-20 NOTE — ED Provider Notes (Signed)
[facility] Emergency Department Provider Note  Time seen: 7:14 PM  I have reviewed the triage vital signs and the nursing notes.   HISTORY  Chief Complaint Rectal Bleeding   HPI [name] is a 43 y.o. female a past medical history depression presents to the emergency department for rectal bleeding.  According to the patient for the past 3 days she has noticed blood in her stool when having a bowel movement.  Patient states today and appeared to have clots as well.  Patient has a history of hemorrhoids but is not sure if this is hemorrhoidal bleeding or something else.  Denies any history of colitis or diverticulitis.  Initially denies any abdominal pain.  Denies any fever.  No cough congestion or shortness of breath.  Past Medical History:  Diagnosis Date  . Depression   . 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 Leukocytosis   . Tobacco use     Patient Active Problem List   Diagnosis Date Noted  . GI bleed 03/23/2018  . Leucocytosis 03/04/2016    Past Surgical History:  Procedure Laterality Date  . BREAST BIOPSY Right 2016   [place] core, fat necrosis  . COLONOSCOPY WITH PROPOFOL N/A 03/25/2018   Procedure: COLONOSCOPY WITH PROPOFOL;  Surgeon: Toledo, Benay Pike, MD;  Location: [facility] ENDOSCOPY;  Service: Gastroenterology;  Laterality: N/A;  . ESOPHAGOGASTRODUODENOSCOPY (EGD) WITH PROPOFOL N/A 03/25/2018   Procedure: ESOPHAGOGASTRODUODENOSCOPY (EGD) WITH PROPOFOL;  Surgeon: Toledo, Benay Pike, MD;  Location: [facility] ENDOSCOPY;  Service: Gastroenterology;  Laterality: N/A;  . KNEE CARTILAGE SURGERY Right 02/2015  . LAPAROSCOPIC TUBAL LIGATION      Prior to Admission medications   Medication Sig Start Date End Date Taking? Authorizing Provider  methocarbamol (ROBAXIN) 500 MG tablet Take 1 tablet (500 mg total) by mouth every 12 (twelve) hours as needed for muscle spasms. 08/07/18   Jearld Fenton, NP  Multiple Vitamins-Minerals  (MULTIVITAMIN WITH MINERALS) tablet Take 1 tablet by mouth at bedtime.    [provider]  naproxen (NAPROSYN) 500 MG tablet Take 1 tablet (500 mg total) by mouth 2 (two) times daily with a meal. 08/07/18   Baity, Coralie Keens, NP  omega-3 acid ethyl esters (LOVAZA) 1 g capsule Take 1 g by mouth at bedtime.    [provider]    No Known Allergies  Family History  Problem Relation Age of Onset  . Prostate cancer Father   . Breast cancer Maternal Grandmother 63    Social History Social History   Tobacco Use  . Smoking status: Current Every Day Smoker    Packs/day: 1.00    Years: 7.00    Pack years: 7.00    Types: Cigarettes  . Smokeless tobacco: Never Used  Substance Use Topics  . Alcohol use: No    Alcohol/week: 0.0 standard drinks  . Drug use: No    Review of Systems Constitutional: Negative for fever. Cardiovascular: Negative for chest pain. Respiratory: Negative for shortness of breath. Gastrointestinal: Negative for abdominal pain.  Positive for blood and blood clots in her stool. Genitourinary: Negative for urinary compaints Musculoskeletal: Negative for musculoskeletal complaints Skin: Negative for skin complaints  Neurological: Negative for headache All other ROS negative  ____________________________________________   PHYSICAL EXAM:  VITAL SIGNS: ED Triage Vitals  Enc Vitals Group     BP 02/20/19 1436 (!) 164/97     Pulse Rate 02/20/19 1436 (!) 109     Resp 02/20/19 1436 14  Temp 02/20/19 1436 98.7 F (37.1 C)     Temp Source 02/20/19 1436 Oral     SpO2 02/20/19 1436 100 %     Weight 02/20/19 1435 170 lb (77.1 kg)     Height --      Head Circumference --      Peak Flow --      Pain Score 02/20/19 1435 0     Pain Loc --      Pain Edu? --      Excl. in [place]? --     Constitutional: Alert and oriented. Well appearing and in no distress. Eyes: Normal exam ENT      Head: Normocephalic and atraumatic.      Mouth/Throat: Mucous  membranes are moist. Cardiovascular: Normal rate, regular rhythm.  Respiratory: Normal respiratory effort without tachypnea nor retractions. Breath sounds are clear  Gastrointestinal: Soft, moderate diffuse tenderness to palpation without rebound guarding or distention. Musculoskeletal: Nontender with normal range of motion in all extremities.  Neurologic:  Normal speech and language. No gross focal neurologic deficits  Skin:  Skin is warm, dry and intact.  Psychiatric: Mood and affect are normal. Speech and behavior are normal.   ____________________________________________     RADIOLOGY  CT scan is negative for acute abnormality.  ____________________________________________   INITIAL IMPRESSION / ASSESSMENT AND PLAN / ED COURSE  Pertinent labs & imaging results that were available during my care of the patient were reviewed by me and considered in my medical decision making (see chart for details).   Patient presents to the emergency department for bloody stool.  Noted blood clots in her stool today.  Has a history of hemorrhoids.  Differential would include hemorrhoidal bleeding, lower GI bleed, diverticulitis or colitis.  Patient has moderate tenderness on abdominal exam, states she did not know that her abdomen was tender until I pushed on it.  No focal area of tenderness identified.  Patient is afebrile.  Patient's labs show significant leukocytosis of 21,000.  Blood levels are stable.  Rectal exam shows solid stool, mildly dark stool strongly guaiac positive.  She does have 2 hemorrhoids on rectal exam however no acute inflammation or apparent active bleeding.  CT is negative for acute abnormality.  Given the patient's tenderness however I believe it would be reasonable to cover with a short course of Augmentin for possible colitis/diverticulitis.  We will have the patient follow-up with GI medicine.  Patient is H&H is stable I believe the patient will be safe for discharge  home.  [name] was evaluated in Emergency Department on 02/20/2019 for the symptoms described in the history of present illness. She was evaluated in the context of the global COVID-19 pandemic, which necessitated consideration that the patient might be at risk for infection with the SARS-CoV-2 virus that causes COVID-19. Institutional protocols and algorithms that pertain to the evaluation of patients at risk for COVID-19 are in a state of rapid change based on information released by regulatory bodies including the CDC and federal and state organizations. These policies and algorithms were followed during the patient's care in the ED.  ____________________________________________   FINAL CLINICAL IMPRESSION(S) / ED DIAGNOSES  Rectal bleeding   Harvest Dark, MD 02/20/19 2232

## 2019-02-20 NOTE — ED Triage Notes (Signed)
Pt presents via POV rectal bleeding x3 days. Reports hx hemorrhoids. Pink tinged per pt report with clots.

## 2019-02-20 NOTE — ED Notes (Signed)
This RN present with EDP during pt's rectal exam.

## 2019-02-21 NOTE — Telephone Encounter (Signed)
-----   Message from Lin Landsman, MD sent at 02/21/2019 11:40 AM EDT ----- Regarding: RE: follow up Lets do Thursday this week ----- Message ----- From: Margarita Grizzle Sent: 02/21/2019  11:26 AM EDT To: Lin Landsman, MD Subject: RE: follow up                                  Hey Dr. Marius Ditch the office is currently out on Friday what is another day I can schedule her for??  ----- Message ----- From: Lin Landsman, MD Sent: 02/21/2019  10:05 AM EDT To: Merrily Brittle Dellinger Subject: follow up                                      Please schedule follow-up visit with me, televisit for Friday this week  RV

## 2019-02-21 NOTE — Telephone Encounter (Signed)
Pt is scheduled for Virtual apt for 02/24/19 at 1:00pm per Dr. Verlin Grills note pt has link

## 2019-02-24 ENCOUNTER — Ambulatory Visit (INDEPENDENT_AMBULATORY_CARE_PROVIDER_SITE_OTHER): Payer: Medicaid Other | Admitting: Gastroenterology

## 2019-02-24 ENCOUNTER — Other Ambulatory Visit: Payer: Self-pay

## 2019-02-24 DIAGNOSIS — E279 Disorder of adrenal gland, unspecified: Secondary | ICD-10-CM

## 2019-02-24 DIAGNOSIS — K5732 Diverticulitis of large intestine without perforation or abscess without bleeding: Secondary | ICD-10-CM

## 2019-02-24 DIAGNOSIS — N949 Unspecified condition associated with female genital organs and menstrual cycle: Secondary | ICD-10-CM

## 2019-02-24 NOTE — Progress Notes (Signed)
[name], MD [address]  [place]  [place], [place] [address]  Main: [phone]  Fax: [phone]    Gastroenterology follow-up video Visit  Referring Provider:     [name], NP Primary Care Physician:  [name], NP Primary Gastroenterologist:  Dr. Cephas Darby Reason for Consultation:     Rectal bleeding        HPI:   [name] is a 43 y.o. female referred by Dr. [name], NP  for consultation & management of rectal bleeding  Virtual Visit Video Note  I connected with [name] on 02/24/19 at  1:00 PM EDT by video and verified that I am speaking with the correct person using two identifiers.   I discussed the limitations, risks, security and privacy concerns of performing an evaluation and management service by video and the availability of in person appointments. I also discussed with the patient that there may be a patient responsible charge related to this service. The patient expressed understanding and agreed to proceed.  Location of the Patient: Home  Location of the provider: Office  Persons participating in the visit: Patient and provider only   History of Present Illness:   She really was seeing me previously for constipation and rectal bleeding. She underwent hemorrhoid ligation.  Rectal bleeding subsided until recently when she went to ER past weekend secondary to severe lower abdominal pain associated with rectal bleeding.  She was found to have significant leukocytosis, 21K, CT abdomen and pelvis did not confirm diverticulitis.  However, she was discharged home on Augmentin.  She has been taking stool softeners and fiber to keep her bowels regular.  Her rectal bleeding subsided and so is abdominal pain.  NSAIDs: None  Antiplts/Anticoagulants/Anti thrombotics: None  GI Procedures: EGD and colonoscopy in 03/2018 by Dr. Alice Reichert EGD unremarkable for H. pylori Colonoscopy revealed internal and external  hemorrhoids  Past Medical History:  Diagnosis Date  . Depression   . 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 Leukocytosis   . Tobacco use     Past Surgical History:  Procedure Laterality Date  . BREAST BIOPSY Right 2016   [place] core, fat necrosis  . COLONOSCOPY WITH PROPOFOL N/A 03/25/2018   Procedure: COLONOSCOPY WITH PROPOFOL;  Surgeon: Toledo, Benay Pike, MD;  Location: [facility] ENDOSCOPY;  Service: Gastroenterology;  Laterality: N/A;  . ESOPHAGOGASTRODUODENOSCOPY (EGD) WITH PROPOFOL N/A 03/25/2018   Procedure: ESOPHAGOGASTRODUODENOSCOPY (EGD) WITH PROPOFOL;  Surgeon: Toledo, Benay Pike, MD;  Location: [facility] ENDOSCOPY;  Service: Gastroenterology;  Laterality: N/A;  . KNEE CARTILAGE SURGERY Right 02/2015  . LAPAROSCOPIC TUBAL LIGATION      Current Outpatient Medications:  .  amoxicillin-clavulanate (AUGMENTIN) 875-125 MG tablet, Take 1 tablet by mouth 2 (two) times daily for 10 days., Disp: 20 tablet, Rfl: 0 .  buPROPion (WELLBUTRIN SR) 150 MG 12 hr tablet, , Disp: , Rfl:  .  clobetasol ointment (TEMOVATE) 0.05 %, Apply twice daily to raised itchy areas until smooth, Disp: , Rfl:  .  FLUoxetine (PROZAC) 40 MG capsule, Take by mouth., Disp: , Rfl:  .  folic acid (FOLVITE) 1 MG tablet, Take 1mg  on days you do not take methotrexate, Disp: , Rfl:  .  gabapentin (NEURONTIN) 100 MG capsule, TK 2 CS PO TID PRN P, Disp: , Rfl:  .  HYDROcodone-acetaminophen (NORCO/VICODIN) 5-325 MG tablet, Take 1 tablet by mouth every 4 (four) hours as needed., Disp: 12 tablet, Rfl: 0 .  ibuprofen (ADVIL)  600 MG tablet, Take 600 mg by mouth every 6 (six) hours as needed., Disp: , Rfl:  .  ketoconazole (NIZORAL) 2 % cream, Apply topically., Disp: , Rfl:  .  meloxicam (MOBIC) 15 MG tablet, TAKE 1 TABLET BY MOUTH ONCE DAILY FOR PAIN, Disp: , Rfl:  .  methocarbamol (ROBAXIN) 500 MG tablet, Take 1 tablet (500 mg total) by mouth every 12 (twelve) hours as needed for muscle spasms. (Patient not taking: Reported  on 02/24/2019), Disp: 14 tablet, Rfl: 0 .  methotrexate 2.5 MG tablet, Take 15 mg by mouth once a week., Disp: , Rfl:  .  Multiple Vitamins-Minerals (MULTIVITAMIN WITH MINERALS) tablet, Take 1 tablet by mouth at bedtime., Disp: , Rfl:  .  naproxen (NAPROSYN) 500 MG tablet, Take 1 tablet (500 mg total) by mouth 2 (two) times daily with a meal. (Patient not taking: Reported on 02/24/2019), Disp: 14 tablet, Rfl: 0 .  omega-3 acid ethyl esters (LOVAZA) 1 g capsule, Take 1 g by mouth at bedtime., Disp: , Rfl:  .  omeprazole (PRILOSEC) 40 MG capsule, Take by mouth., Disp: , Rfl:    Family History  Problem Relation Age of Onset  . Prostate cancer Father   . Breast cancer Maternal Grandmother 73     Social History   Tobacco Use  . Smoking status: Current Every Day Smoker    Packs/day: 1.00    Years: 7.00    Pack years: 7.00    Types: Cigarettes  . Smokeless tobacco: Never Used  Substance Use Topics  . Alcohol use: No    Alcohol/week: 0.0 standard drinks  . Drug use: No    Allergies as of 02/24/2019  . (No Known Allergies)     Imaging Studies: Reviewed  Assessment and Plan:   [name] is a 43 y.o. female with history of hemorrhoids, chronic constipation, functional dyspepsia, hemorrhoid ligation with recent episode of acute lower abdominal pain associated with rectal bleeding significant leukocytosis.  Patient is empirically treated with Augmentin which has resolved her symptoms.  She probably had an attack of diverticulitis.  Continue bowel regimen to avoid constipation.  Repeat CBC after finishing the course of antibiotic, ordered.  On the CT scan, she was incidentally found to have bilateral complex adnexal cyst as well as left adrenal gland indeterminate lesion.  I faxed a copy of the CT scan results to patient's PCP and patient reported that she tried to contact her PCP but no response.  She is requesting me to place referral to gynecology as well as general surgery.   Referrals will be replaced and my nurse will try to contact her PCP again   Follow Up Instructions:   I discussed the assessment and treatment plan with the patient. The patient was provided an opportunity to ask questions and all were answered. The patient agreed with the plan and demonstrated an understanding of the instructions.   The patient was advised to call back or seek an in-person evaluation if the symptoms worsen or if the condition fails to improve as anticipated.  I provided 15 minutes of face-to-face time during this encounter.   Follow up as needed   Cephas Darby, MD

## 2019-03-02 ENCOUNTER — Ambulatory Visit (INDEPENDENT_AMBULATORY_CARE_PROVIDER_SITE_OTHER): Payer: Self-pay | Admitting: General Surgery

## 2019-03-02 ENCOUNTER — Encounter: Payer: Self-pay | Admitting: General Surgery

## 2019-03-02 ENCOUNTER — Other Ambulatory Visit: Payer: Self-pay

## 2019-03-02 VITALS — BP 135/85 | HR 116 | Temp 98.1°F | Resp 18 | Ht 62.0 in | Wt 175.6 lb

## 2019-03-02 DIAGNOSIS — E278 Other specified disorders of adrenal gland: Secondary | ICD-10-CM

## 2019-03-02 MED ORDER — DEXAMETHASONE 1 MG PO TABS: 1.0000 mg | ORAL_TABLET | Freq: Once | ORAL | 0 refills | Status: AC

## 2019-03-02 NOTE — Progress Notes (Signed)
Patient ID: Malikah Principato, female   DOB: July 01, 1976, 43 y.o.   MRN: [id number]  Chief Complaint  Patient presents with  . New Patient (Initial Visit)    new pt ref Dr.Vanga Lesion adrenal gland    HPI Hertha Gergen is a 43 y.o. female.   She has been followed by Dr. Marius Ditch for hemorrhoids and GI bleeding.  She was recently seen in the emergency department and treated for possible diverticulitis.  A CT scan that was done at the time showed a left adrenal lesion.  It had been seen on prior studies and has grown a couple of millimeters.  It is indeterminate based on imaging characteristics, but a true adrenal protocol CT scan has not been performed.  No biochemical evaluation has been performed.  Ms. 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  This was performed at [facility].  Ms. Kraker states that she is not taking any medications.  She just completed a course of antibiotics for presumed diverticulitis, but otherwise discontinued all prior medications.  She denies hypertension.  No history of hypokalemia or elevated blood glucose levels.  She thinks she may have gained some weight, but it is not greater than 20 pounds.  She denies heart palpitations syncope or near syncope.  No proximal muscle weakness.  No family history of multiple endocrine neoplasia syndromes, von Hippel-Lindau syndrome or other familial endocrinopathies.   Past Medical History:  Diagnosis Date  . Depression   . 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 Leukocytosis   . Tobacco use     Past Surgical History:  Procedure Laterality Date  . BREAST BIOPSY Right 2016   [place] core, fat necrosis  . COLONOSCOPY WITH PROPOFOL N/A 03/25/2018   Procedure: COLONOSCOPY WITH PROPOFOL;  Surgeon: Toledo, Benay Pike, MD;  Location: [facility] ENDOSCOPY;  Service: Gastroenterology;  Laterality: N/A;  . ESOPHAGOGASTRODUODENOSCOPY (EGD) WITH PROPOFOL N/A 03/25/2018   Procedure:  ESOPHAGOGASTRODUODENOSCOPY (EGD) WITH PROPOFOL;  Surgeon: Toledo, Benay Pike, MD;  Location: [facility] ENDOSCOPY;  Service: Gastroenterology;  Laterality: N/A;  . KNEE CARTILAGE SURGERY Right 02/2015  . LAPAROSCOPIC TUBAL LIGATION      Family History  Problem Relation Age of Onset  . Prostate cancer Father   . Breast cancer Maternal Grandmother 79  . Colon cancer Neg Hx     Social History Social History   Tobacco Use  . Smoking status: Current Every Day Smoker    Packs/day: 1.00    Years: 7.00    Pack years: 7.00    Types: Cigarettes  . Smokeless tobacco: Never Used  Substance Use Topics  . Alcohol use: No    Alcohol/week: 0.0 standard drinks  . Drug use: No    No Known Allergies  Current Outpatient Medications  Medication Sig Dispense Refill  . dexamethasone (DECADRON) 1 MG tablet Take 1 tablet (1 mg total) by mouth once for 1 dose. Take one tablet at 11 pm the night before the lab work 1 tablet 0   No current facility-administered medications for this visit.     Review of Systems Review of Systems  All other systems reviewed and are negative.   Blood pressure 135/85, pulse (!) 116, temperature 98.1 F (36.7 C), temperature source Temporal, resp. rate 18, height 5\' 2"  (1.575 m), weight 175 lb 9.6 oz (79.7 kg), last menstrual period 03/01/2019, SpO2 97 %.  Physical Exam Physical Exam Constitutional:      General: She is  not in acute distress.    Appearance: Normal appearance. She is obese.  HENT:     Head: Normocephalic and atraumatic.     Comments: No facial plethora    Nose: Nose normal.     Mouth/Throat:     Comments: Covered with mask secondary to COVID-19 precautions Eyes:     General: No scleral icterus.       Right eye: No discharge.        Left eye: No discharge.     Conjunctiva/sclera: Conjunctivae normal.  Neck:     Musculoskeletal: Normal range of motion. No neck rigidity.     Comments: No thyromegaly.  No increased size of dorsocervical fat  pad Cardiovascular:     Rate and Rhythm: Tachycardia present.     Heart sounds: No murmur.  Pulmonary:     Effort: Pulmonary effort is normal.     Breath sounds: Normal breath sounds.  Abdominal:     General: Bowel sounds are normal.     Palpations: Abdomen is soft.     Comments: She does have centripetal obesity, but no violaceous striae.  Genitourinary:    Comments: Deferred Musculoskeletal: Normal range of motion.     Right lower leg: No edema.     Left lower leg: No edema.  Lymphadenopathy:     Cervical: No cervical adenopathy.  Skin:    General: Skin is warm and dry.  Neurological:     General: No focal deficit present.     Mental Status: She is alert.  Psychiatric:        Mood and Affect: Mood normal.        Behavior: Behavior normal.        Thought Content: Thought content normal.     Data Reviewed I personally reviewed the CT scans that have been performed.  These demonstrate a fairly small left adrenal mass with indeterminate Hounsfield units.  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  Her mother does have a history of having subclinical Cushing's and a subsequent adrenalectomy.  No biochemical evaluation has been performed.  We will obtain labs including a low-dose dexamethasone suppression test (cortisol and ACTH), dexamethasone level, aldosterone, renin activity, aldosterone renin ratio, plasma metanephrines, plasma fractionated catecholamines.  Once these results are available, I will contact the patient.  If anything is positive, we will discuss the next steps.  This will likely be surgery.  If all are negative, she will simply need a repeat CT scan using proper adrenal protocol in 1 years time to monitor for interval growth or change.    Fredirick Maudlin 03/02/2019, 3:53 PM

## 2019-03-02 NOTE — Patient Instructions (Addendum)
The patient is aware to call back for any questions or new concerns.  Take dexamethasone at 11 pm the night before labs. Nothing to eat or drink the morning of the labs and no caffeine

## 2019-03-03 ENCOUNTER — Encounter: Payer: Self-pay | Admitting: Gastroenterology

## 2019-03-03 ENCOUNTER — Other Ambulatory Visit: Payer: Self-pay

## 2019-03-03 DIAGNOSIS — K5732 Diverticulitis of large intestine without perforation or abscess without bleeding: Secondary | ICD-10-CM

## 2019-03-04 ENCOUNTER — Encounter: Payer: Self-pay | Admitting: Gastroenterology

## 2019-03-04 ENCOUNTER — Other Ambulatory Visit: Payer: Self-pay | Admitting: Gastroenterology

## 2019-03-04 ENCOUNTER — Other Ambulatory Visit: Payer: Self-pay

## 2019-03-04 DIAGNOSIS — D72829 Elevated white blood cell count, unspecified: Secondary | ICD-10-CM

## 2019-03-04 LAB — CBC
Hematocrit: 46.7 % — ABNORMAL HIGH (ref 34.0–46.6)
Hemoglobin: 15.7 g/dL (ref 11.1–15.9)
MCH: 30.5 pg (ref 26.6–33.0)
MCHC: 33.6 g/dL (ref 31.5–35.7)
MCV: 91 fL (ref 79–97)
Platelets: 519 10*3/uL — ABNORMAL HIGH (ref 150–450)
RBC: 5.14 x10E6/uL (ref 3.77–5.28)
RDW: 13.2 % (ref 11.7–15.4)
WBC: 18.2 10*3/uL — ABNORMAL HIGH (ref 3.4–10.8)

## 2019-03-05 LAB — C-REACTIVE PROTEIN: CRP: 5 mg/L (ref 0–10)

## 2019-03-07 ENCOUNTER — Telehealth: Payer: Self-pay

## 2019-03-07 LAB — GI PROFILE, STOOL, PCR

## 2019-03-07 NOTE — Telephone Encounter (Signed)
Pt prescreened no symptoms needs face mask.   Coronavirus (COVID-19) Are you at risk?  Are you at risk for the Coronavirus (COVID-19)?  To be considered HIGH RISK for Coronavirus (COVID-19), you have to meet the following criteria:  . Traveled to [place], [place], [place], [place] or [place]; or in the [place] to [place], [place], [place], or [place]; and have fever, cough, and shortness of breath within the last 2 weeks of travel OR . Been in close contact with a person diagnosed with COVID-19 within the last 2 weeks and have fever, cough, and shortness of breath . IF YOU DO NOT MEET THESE CRITERIA, YOU ARE CONSIDERED LOW RISK FOR COVID-19.  What to do if you are HIGH RISK for COVID-19?  Marland Kitchen If you are having a medical emergency, call 911. . Seek medical care right away. Before you go to a doctor's office, urgent care or emergency department, call ahead and tell them about your recent travel, contact with someone diagnosed with COVID-19, and your symptoms. You should receive instructions from your physician's office regarding next steps of care.  . When you arrive at healthcare provider, tell the healthcare staff immediately you have returned from visiting [place], [place], [place], [place] or [place]; or traveled in the [place] to [place], [place], [place], or [place]; in the last two weeks or you have been in close contact with a person diagnosed with COVID-19 in the last 2 weeks.   . Tell the health care staff about your symptoms: fever, cough and shortness of breath. . After you have been seen by a medical provider, you will be either: o Tested for (COVID-19) and discharged home on quarantine except to seek medical care if symptoms worsen, and asked to  - Stay home and avoid contact with others until you get your results (4-5 days)  - Avoid travel on public transportation if possible (such as bus, train, or airplane) or o Sent to the Emergency Department by EMS for  evaluation, COVID-19 testing, and possible admission depending on your condition and test results.  What to do if you are LOW RISK for COVID-19?  Reduce your risk of any infection by using the same precautions used for avoiding the common cold or flu:  Marland Kitchen Wash your hands often with soap and warm water for at least 20 seconds.  If soap and water are not readily available, use an alcohol-based hand sanitizer with at least 60% alcohol.  . If coughing or sneezing, cover your mouth and nose by coughing or sneezing into the elbow areas of your shirt or coat, into a tissue or into your sleeve (not your hands). . Avoid shaking hands with others and consider head nods or verbal greetings only. . Avoid touching your eyes, nose, or mouth with unwashed hands.  . Avoid close contact with people who are sick. . Avoid places or events with large numbers of people in one location, like concerts or sporting events. . Carefully consider travel plans you have or are making. . If you are planning any travel outside or inside the [place], visit the CDC's Travelers' Health webpage for the latest health notices. . If you have some symptoms but not all symptoms, continue to monitor at home and seek medical attention if your symptoms worsen. . If you are having a medical emergency, call 911.   ADDITIONAL HEALTHCARE OPTIONS FOR PATIENTS   Telehealth / e-Visit: [email or web address]  [facility]: [phone]  [facility]: [phone]                   [facility]: [phone]  

## 2019-03-07 NOTE — Telephone Encounter (Signed)
Pt called no answer LM via voicemail to call the office for prescreening. 

## 2019-03-08 ENCOUNTER — Other Ambulatory Visit: Payer: Self-pay

## 2019-03-08 ENCOUNTER — Encounter: Payer: Self-pay | Admitting: Obstetrics and Gynecology

## 2019-03-08 ENCOUNTER — Encounter: Payer: Self-pay | Admitting: Gastroenterology

## 2019-03-08 ENCOUNTER — Ambulatory Visit (INDEPENDENT_AMBULATORY_CARE_PROVIDER_SITE_OTHER): Payer: Medicaid Other | Admitting: Obstetrics and Gynecology

## 2019-03-08 VITALS — BP 154/85 | HR 114 | Ht 62.0 in | Wt 175.4 lb

## 2019-03-08 DIAGNOSIS — N83201 Unspecified ovarian cyst, right side: Secondary | ICD-10-CM

## 2019-03-08 DIAGNOSIS — N946 Dysmenorrhea, unspecified: Secondary | ICD-10-CM

## 2019-03-08 DIAGNOSIS — N920 Excessive and frequent menstruation with regular cycle: Secondary | ICD-10-CM

## 2019-03-08 DIAGNOSIS — N83202 Unspecified ovarian cyst, left side: Secondary | ICD-10-CM

## 2019-03-08 LAB — POCT URINALYSIS DIPSTICK OB
Bilirubin, UA: NEGATIVE
Blood, UA: NEGATIVE
Glucose, UA: NEGATIVE
Ketones, UA: NEGATIVE
Leukocytes, UA: NEGATIVE
Nitrite, UA: NEGATIVE
POC,PROTEIN,UA: NEGATIVE
Spec Grav, UA: 1.01 (ref 1.010–1.025)
Urobilinogen, UA: 0.2 E.U./dL
pH, UA: 6.5 (ref 5.0–8.0)

## 2019-03-08 NOTE — Progress Notes (Signed)
HPI:      [name] is a 43 y.o. G4P0010 who LMP was Patient's last menstrual period was 03/01/2019.  Subjective:   She presents today for follow-up of her ED visit.  Her CT at that time showed bilateral small ovarian cysts.  She continues to experience rare but occasional right upper quadrant back pain which is intermittent.  She explains that she has had a work-up for adrenal cysts. As we talked she also discussed her problem with heavy menstrual bleeding and cramping every month.    Hx: The following portions of the patient's history were reviewed and updated as appropriate:             She  has a past medical history of Depression, Focal nodular hyperplasia determined by liver biopsy, Leukocytosis, and Tobacco use. She does not have any pertinent problems on file. She  has a past surgical history that includes Knee cartilage surgery (Right, 02/2015); Laparoscopic tubal ligation; Breast biopsy (Right, 2016); Esophagogastroduodenoscopy (egd) with propofol (N/A, 03/25/2018); and Colonoscopy with propofol (N/A, 03/25/2018). Her family history includes Breast cancer (age of onset: 46) in her maternal grandmother; Prostate cancer in her father. She  reports that she has been smoking cigarettes. She has a 7.00 pack-year smoking history. She has never used smokeless tobacco. She reports that she does not drink alcohol or use drugs. She has a current medication list which includes the following prescription(s): gabapentin. She has No Known Allergies.       Review of Systems:  Review of Systems  Constitutional: Denied constitutional symptoms, night sweats, recent illness, fatigue, fever, insomnia and weight loss.  Eyes: Denied eye symptoms, eye pain, photophobia, vision change and visual disturbance.  Ears/Nose/Throat/Neck: Denied ear, nose, throat or neck symptoms, hearing loss, nasal discharge, sinus congestion and sore throat.  Cardiovascular: Denied cardiovascular symptoms, arrhythmia,  chest pain/pressure, edema, exercise intolerance, orthopnea and palpitations.  Respiratory: Denied pulmonary symptoms, asthma, pleuritic pain, productive sputum, cough, dyspnea and wheezing.  Gastrointestinal: Denied, gastro-esophageal reflux, melena, nausea and vomiting.  Genitourinary: See HPI for additional information.  Musculoskeletal: Denied musculoskeletal symptoms, stiffness, swelling, muscle weakness and myalgia.  Dermatologic: Denied dermatology symptoms, rash and scar.  Neurologic: Denied neurology symptoms, dizziness, headache, neck pain and syncope.  Psychiatric: Denied psychiatric symptoms, anxiety and depression.  Endocrine: Denied endocrine symptoms including hot flashes and night sweats.   Meds:   Current Outpatient Medications on File Prior to Visit  Medication Sig Dispense Refill  . gabapentin (NEURONTIN) 100 MG capsule Take 100 mg by mouth 3 (three) times daily.     No current facility-administered medications on file prior to visit.     Objective:     Vitals:   03/08/19 1428  BP: (!) 154/85  Pulse: (!) 114              CT results reviewed directly with the patient  Assessment:    G4P0010 Patient Active Problem List   Diagnosis Date Noted  . GI bleed 03/23/2018  . Leucocytosis 03/04/2016     1. Cysts of both ovaries   2. Menorrhagia with regular cycle   3. Dysmenorrhea     Strongly doubt patient's abdominal or back pain related to cysts.  Possible hemorrhagic ovarian cysts.  Possible uterine fibroids based on CT.   Plan:            1.  Recommend follow-up ultrasound for presence of ovarian cyst and to further evaluate possibility of uterine fibroids.  Patient does not  have insurance coverage at this time so she is requesting that 6-week follow-up was performed as recommended by radiology.  2.  I have discussed with her and we will consider IUD use in the future for dysmenorrhea and menorrhagia as long as her uterine fibroids are not submucosal.  (OCPs not an option as patient is a tobacco user.)  Orders Orders Placed This Encounter  Procedures  . US PELVIS (TRANSABDOMINAL ONLY)  . POC Urinalysis Dipstick OB    No orders of the defined types were placed in this encounter.     F/U  Return in about 6 weeks (around 04/19/2019). I spent 32 minutes involved in the care of this patient of which greater than 50% was spent discussing ED visit, findings at CT, natural course and history of uterine fibroids, ovarian cysts both complex and simple follow-ups and possible treatments in the future.  All questions answered.  Finis Bud, M.D. 03/08/2019 2:58 PM

## 2019-03-08 NOTE — Progress Notes (Signed)
Patient comes in today for a new patient appointment today. She had a CT scan on 02/20/2019 that showed a adnexal cyst. Patient states that she is having a little pain in the right lower back. No abdominal pain.

## 2019-03-09 ENCOUNTER — Other Ambulatory Visit: Payer: Self-pay | Admitting: Gastroenterology

## 2019-03-09 ENCOUNTER — Other Ambulatory Visit: Payer: Self-pay | Admitting: General Surgery

## 2019-03-09 ENCOUNTER — Encounter: Payer: Self-pay | Admitting: Gastroenterology

## 2019-03-09 ENCOUNTER — Encounter: Payer: Self-pay | Admitting: General Surgery

## 2019-03-09 DIAGNOSIS — E278 Other specified disorders of adrenal gland: Secondary | ICD-10-CM

## 2019-03-09 DIAGNOSIS — D72829 Elevated white blood cell count, unspecified: Secondary | ICD-10-CM

## 2019-03-09 LAB — GI PROFILE, STOOL, PCR

## 2019-03-09 LAB — C DIFFICILE, CYTOTOXIN B

## 2019-03-09 LAB — CALPROTECTIN, FECAL: Calprotectin, Fecal: 49 ug/g (ref 0–120)

## 2019-03-09 LAB — C DIFFICILE TOXINS A+B W/RFLX: C difficile Toxins A+B, EIA: NEGATIVE

## 2019-03-09 LAB — ALDOSTERONE + RENIN ACTIVITY W/ RATIO: ALDOS/RENIN RATIO: 1.5 (ref 0.0–30.0)

## 2019-03-15 DIAGNOSIS — F902 Attention-deficit hyperactivity disorder, combined type: Secondary | ICD-10-CM | POA: Insufficient documentation

## 2019-04-20 ENCOUNTER — Other Ambulatory Visit: Payer: Self-pay

## 2019-04-20 ENCOUNTER — Encounter: Payer: Self-pay | Admitting: Obstetrics and Gynecology

## 2019-04-20 ENCOUNTER — Other Ambulatory Visit: Payer: Self-pay | Admitting: Internal Medicine

## 2019-04-20 ENCOUNTER — Ambulatory Visit
Admission: EM | Admit: 2019-04-20 | Discharge: 2019-04-20 | Disposition: A | Discharge status: Home / Self Care | Payer: Medicaid Other | Attending: Family Medicine | Admitting: Family Medicine

## 2019-04-20 ENCOUNTER — Ambulatory Visit: Payer: Medicaid Other

## 2019-04-20 ENCOUNTER — Encounter: Payer: Self-pay | Admitting: Emergency Medicine

## 2019-04-20 ENCOUNTER — Ambulatory Visit (INDEPENDENT_AMBULATORY_CARE_PROVIDER_SITE_OTHER): Payer: Medicaid Other | Admitting: Obstetrics and Gynecology

## 2019-04-20 ENCOUNTER — Other Ambulatory Visit: Payer: Self-pay | Admitting: Obstetrics and Gynecology

## 2019-04-20 ENCOUNTER — Ambulatory Visit (INDEPENDENT_AMBULATORY_CARE_PROVIDER_SITE_OTHER): Payer: Medicaid Other

## 2019-04-20 VITALS — BP 143/84 | HR 101 | Ht 62.0 in | Wt 170.4 lb

## 2019-04-20 DIAGNOSIS — N83201 Unspecified ovarian cyst, right side: Secondary | ICD-10-CM

## 2019-04-20 DIAGNOSIS — R102 Pelvic and perineal pain: Secondary | ICD-10-CM

## 2019-04-20 DIAGNOSIS — R52 Pain, unspecified: Secondary | ICD-10-CM

## 2019-04-20 DIAGNOSIS — D219 Benign neoplasm of connective and other soft tissue, unspecified: Secondary | ICD-10-CM

## 2019-04-20 DIAGNOSIS — N946 Dysmenorrhea, unspecified: Secondary | ICD-10-CM

## 2019-04-20 DIAGNOSIS — M25561 Pain in right knee: Secondary | ICD-10-CM | POA: Insufficient documentation

## 2019-04-20 DIAGNOSIS — N83202 Unspecified ovarian cyst, left side: Secondary | ICD-10-CM

## 2019-04-20 DIAGNOSIS — M1711 Unilateral primary osteoarthritis, right knee: Secondary | ICD-10-CM | POA: Diagnosis present

## 2019-04-20 DIAGNOSIS — N920 Excessive and frequent menstruation with regular cycle: Secondary | ICD-10-CM

## 2019-04-20 MED ORDER — PREDNISONE 20 MG PO TABS: 20.0000 mg | ORAL_TABLET | Freq: Every day | ORAL | 0 refills | Status: DC

## 2019-04-20 NOTE — Progress Notes (Signed)
Patient comes today for 6 week follow up on [place]

## 2019-04-20 NOTE — ED Triage Notes (Signed)
Patient c/o right knee pain that started today. Denies injury.

## 2019-04-20 NOTE — ED Provider Notes (Signed)
[facility]    CSN: [id number] Arrival date & time: 04/20/19  1535     History   Chief Complaint Chief Complaint  Patient presents with  . Knee Pain    HPI [name] is a 43 y.o. female.   43 yo female with a c/o right knee pain since this morning. Denies any falls or other injury. Denies any redness, fevers, chills, rash, swelling.      Past Medical History:  Diagnosis Date  . Depression   . 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 Leukocytosis   . Tobacco use     Patient Active Problem List   Diagnosis Date Noted  . GI bleed 03/23/2018  . Leucocytosis 03/04/2016    Past Surgical History:  Procedure Laterality Date  . BREAST BIOPSY Right 2016   [place] core, fat necrosis  . COLONOSCOPY WITH PROPOFOL N/A 03/25/2018   Procedure: COLONOSCOPY WITH PROPOFOL;  Surgeon: Toledo, Benay Pike, MD;  Location: [facility];  Service: Gastroenterology;  Laterality: N/A;  . ESOPHAGOGASTRODUODENOSCOPY (EGD) WITH PROPOFOL N/A 03/25/2018   Procedure: ESOPHAGOGASTRODUODENOSCOPY (EGD) WITH PROPOFOL;  Surgeon: Toledo, Benay Pike, MD;  Location: [facility];  Service: Gastroenterology;  Laterality: N/A;  . KNEE CARTILAGE SURGERY Right 02/2015  . LAPAROSCOPIC TUBAL LIGATION      OB History    Gravida  4   Para  3   Term      Preterm      AB  1   Living        SAB  1   TAB      Ectopic      Multiple      Live Births           Obstetric Comments  1st Menstrual Cycle:  18 1st Pregnancy:  18          Home Medications    Prior to Admission medications   Medication Sig Start Date End Date Taking? Authorizing Provider  gabapentin (NEURONTIN) 100 MG capsule Take 100 mg by mouth 3 (three) times daily.    [provider]  predniSONE (DELTASONE) 20 MG tablet Take 1 tablet (20 mg total) by mouth daily. 04/20/19   Norval Gable, MD    Family History Family History  Problem Relation Age of Onset  . Prostate cancer  Father   . Breast cancer Maternal Grandmother 62  . Colon cancer Neg Hx     Social History Social History   Tobacco Use  . Smoking status: Current Every Day Smoker    Packs/day: 1.00    Years: 7.00    Pack years: 7.00    Types: Cigarettes  . Smokeless tobacco: Never Used  Substance Use Topics  . Alcohol use: No    Alcohol/week: 0.0 standard drinks  . Drug use: No     Allergies   Patient has no known allergies.   Review of Systems Review of Systems   Physical Exam Triage Vital Signs ED Triage Vitals  Enc Vitals Group     BP 04/20/19 1551 125/90     Pulse Rate 04/20/19 1551 100     Resp 04/20/19 1551 18     Temp 04/20/19 1551 98.8 F (37.1 C)     Temp Source 04/20/19 1551 Oral     SpO2 04/20/19 1551 100 %     Weight 04/20/19 1548 170 lb (77.1 kg)     Height 04/20/19 1548 5\' 2"  (1.575 m)  Head Circumference --      Peak Flow --      Pain Score 04/20/19 1548 5     Pain Loc --      Pain Edu? --      Excl. in [place]? --    No data found.  Updated Vital Signs BP 125/90 (BP Location: Right Arm)   Pulse 100   Temp 98.8 F (37.1 C) (Oral)   Resp 18   Ht 5\' 2"  (1.575 m)   Wt 77.1 kg   LMP 04/16/2019   SpO2 100%   BMI 31.09 kg/m   Visual Acuity Right Eye Distance:   Left Eye Distance:   Bilateral Distance:    Right Eye Near:   Left Eye Near:    Bilateral Near:     Physical Exam Vitals signs and nursing note reviewed.  Constitutional:      General: She is not in acute distress.    Appearance: She is not toxic-appearing or diaphoretic.  Musculoskeletal:     Right knee: She exhibits normal range of motion, no swelling, no effusion, no ecchymosis, no deformity, no laceration, no erythema, normal alignment, no LCL laxity, normal patellar mobility, no bony tenderness, normal meniscus and no MCL laxity. Tenderness (mild; diffuse) found. No patellar tendon tenderness noted.  Neurological:     Mental Status: She is alert.      UC Treatments / Results   Labs (all labs ordered are listed, but only abnormal results are displayed) Labs Reviewed - No data to display  EKG   Radiology US Pelvis (transabdominal Only)  Result Date: 04/20/2019 Patient Name: [name] DOB: 07/18/1976 MRN: [id number] ULTRASOUND REPORT Location: [facility] Date of Service: 04/20/2019 Indications:Enlarged Uterus/ Follow up CT. Findings: The uterus is anteverted and measures 10 x 4.8 x 6.3 cm. Echo texture is heterogenous with evidence of focal masses. Within the uterus are multiple suspected fibroids measuring: Fibroid 1: Posterior fundal IM 1.5 x 1.3 x 1.3 cm Fibroid 2: Posterior body IM 1.1 x 1.0 x 1.0 cm Fibroid 3: Submucosal 1.1 x 1.0 x 1.0 cm The Endometrium measures 1.4 mm. Right Ovary measures 3.8 x 1.8 x 3.5 cm. It is normal in appearance. Left Ovary measures 4.3 x 2.5 x 3.7 cm. It is not normal in appearance. Lt Ovarian Septate hypoechoic lesion measuring 3.7 x 2.2 x 2.9 cm. Survey of the adnexa demonstrates no adnexal masses. There is no free fluid in the cul de sac. Impression: 1. Enlarge fibroid uterus as described above. 2. Thicken endometrium = 1.4 cm in thickness. 3. Lt Ovarian septate cystic lesion as described above. Recommendations: 1.Clinical correlation with the patient's History and Physical Exam. Jenine M. Albertine Grates    RDMS The ultrasound images and findings were reviewed by me and I agree with the above report. Finis Bud, M.D. 04/20/2019 2:15 PM  Dg Knee Complete 4 Views Right  Result Date: 04/20/2019 CLINICAL DATA:  Patient fell 1 week ago with persistent pain with walking. EXAM: RIGHT KNEE - COMPLETE 4+ VIEW COMPARISON:  11/27/2017 FINDINGS: No fracture or dislocation. Mild degenerative change involving the medial compartment of the knee and patellar femoral joints with joint space loss, subchondral sclerosis osteophytosis. No joint effusion. No evidence of chondrocalcinosis. Regional soft tissues appear normal. IMPRESSION: 1. No acute  findings. 2. Mild degenerative change of the knee, similar to the 11/2017 examination Electronically Signed   By: Sandi Mariscal M.D.   On: 04/20/2019 16:14   US Pelvic Complete With Transvaginal  Result Date: 04/20/2019 Patient Name: [name] DOB: 21-Nov-1975 MRN: [id number]    Procedures Procedures (including critical care time)  Medications Ordered in UC Medications - No data to display  Initial Impression / Assessment and Plan / UC Course  I have reviewed the triage vital signs and the nursing notes.  Pertinent labs & imaging results that were available during my care of the patient were reviewed by me and considered in my medical decision making (see chart for details).      Final Clinical Impressions(s) / UC Diagnoses   Final diagnoses:  Acute pain of right knee  Primary osteoarthritis of right knee     Discharge Instructions     Rest, ice, elevation, tylenol    ED Prescriptions    Medication Sig Dispense Auth. Provider   predniSONE (DELTASONE) 20 MG tablet Take 1 tablet (20 mg total) by mouth daily. 5 tablet Norval Gable, MD      1. x-ray results and diagnosis reviewed with patient 2. rx as per orders above; reviewed possible side effects, interactions, risks and benefits  3. Recommend supportive treatment as above 4. Follow-up prn if symptoms worsen or don't improve   Controlled Substance Prescriptions [place] Controlled Substance Registry consulted? Not Applicable   Norval Gable, MD 04/20/19 [phone]

## 2019-04-20 NOTE — Progress Notes (Signed)
HPI:      [name] is a 43 y.o. G4P0010 who LMP was No LMP recorded.  Subjective:   She presents today to discuss her pelvic pain dysmenorrhea and cysts previously noted on CT.  She had a follow-up ultrasound today.   She reports that she occasionally has left-sided discomfort but it has improved greatly since her last visit.  Her main concern at this time is her heavy bleeding and dysmenorrhea. Of significant note patient smokes cigarettes.    Hx: The following portions of the patient's history were reviewed and updated as appropriate:             She  has a past medical history of Depression, Focal nodular hyperplasia determined by liver biopsy, Leukocytosis, and Tobacco use. She does not have any pertinent problems on file. She  has a past surgical history that includes Knee cartilage surgery (Right, 02/2015); Laparoscopic tubal ligation; Breast biopsy (Right, 2016); Esophagogastroduodenoscopy (egd) with propofol (N/A, 03/25/2018); and Colonoscopy with propofol (N/A, 03/25/2018). Her family history includes Breast cancer (age of onset: 2) in her maternal grandmother; Prostate cancer in her father. She  reports that she has been smoking cigarettes. She has a 7.00 pack-year smoking history. She has never used smokeless tobacco. She reports that she does not drink alcohol or use drugs. She has a current medication list which includes the following prescription(s): gabapentin. She has No Known Allergies.       Review of Systems:  Review of Systems  Constitutional: Denied constitutional symptoms, night sweats, recent illness, fatigue, fever, insomnia and weight loss.  Eyes: Denied eye symptoms, eye pain, photophobia, vision change and visual disturbance.  Ears/Nose/Throat/Neck: Denied ear, nose, throat or neck symptoms, hearing loss, nasal discharge, sinus congestion and sore throat.  Cardiovascular: Denied cardiovascular symptoms, arrhythmia, chest pain/pressure, edema, exercise  intolerance, orthopnea and palpitations.  Respiratory: Denied pulmonary symptoms, asthma, pleuritic pain, productive sputum, cough, dyspnea and wheezing.  Gastrointestinal: Denied, gastro-esophageal reflux, melena, nausea and vomiting.  Genitourinary: See HPI for additional information.  Musculoskeletal: Denied musculoskeletal symptoms, stiffness, swelling, muscle weakness and myalgia.  Dermatologic: Denied dermatology symptoms, rash and scar.  Neurologic: Denied neurology symptoms, dizziness, headache, neck pain and syncope.  Psychiatric: Denied psychiatric symptoms, anxiety and depression.  Endocrine: Denied endocrine symptoms including hot flashes and night sweats.   Meds:   Current Outpatient Medications on File Prior to Visit  Medication Sig Dispense Refill  . gabapentin (NEURONTIN) 100 MG capsule Take 100 mg by mouth 3 (three) times daily.     No current facility-administered medications on file prior to visit.     Objective:     Vitals:   04/20/19 1014  BP: (!) 143/84  Pulse: (!) 101              Ultrasound results directly reviewed with the patient.  Assessment:    G4P0010 Patient Active Problem List   Diagnosis Date Noted  . GI bleed 03/23/2018  . Leucocytosis 03/04/2016     1. Pelvic pain in female   2. Dysmenorrhea   3. Cyst of left ovary   4. Fibroids     Patient's pain is improving.  Based on the ultrasound she has the persistence of a small left ovarian cyst but the right ovarian cyst has completely resolved.  (The right was the largest on her previous CT)  I believe that likely her dysmenorrhea is secondary to uterine fibroids.  Patient is unhappy with this and would like some type of  management.   Plan:            1.  Recommend follow-up ultrasound for completeness sake in 6 weeks to reexamine left ovarian cyst  2.  We have discussed management schemes for cycle control and dysmenorrhea.  Patient has chosen Mirena IUD.  She will be present in the  next 2 days while she is on her menses for insertion. Natural course and history of uterine fibroids discussed in detail. Orders No orders of the defined types were placed in this encounter.   No orders of the defined types were placed in this encounter.     F/U  Return in about 6 weeks (around 06/01/2019). I spent 20 minutes involved in the care of this patient of which greater than 50% was spent discussing ultrasound ovarian cyst benign versus malignant expectant management, management for dysmenorrhea menorrhagia and uterine fibroids.  Risks and benefits of IUD.  All questions answered.  Finis Bud, M.D. 04/20/2019 11:59 AM

## 2019-04-20 NOTE — Discharge Instructions (Addendum)
Rest, ice, elevation, tylenol °

## 2019-04-22 ENCOUNTER — Encounter: Payer: Self-pay | Admitting: Obstetrics and Gynecology

## 2019-04-22 ENCOUNTER — Other Ambulatory Visit: Payer: Self-pay

## 2019-04-22 ENCOUNTER — Ambulatory Visit (INDEPENDENT_AMBULATORY_CARE_PROVIDER_SITE_OTHER): Payer: Medicaid Other | Admitting: Obstetrics and Gynecology

## 2019-04-22 VITALS — BP 136/88 | HR 89 | Wt 169.6 lb

## 2019-04-22 DIAGNOSIS — Z30430 Encounter for insertion of intrauterine contraceptive device: Secondary | ICD-10-CM

## 2019-04-22 NOTE — Progress Notes (Signed)
Patient comes in today for IUD insert.

## 2019-04-22 NOTE — Progress Notes (Signed)
HPI:      [name] is a 43 y.o. G4P0010 who LMP was Patient's last menstrual period was 04/16/2019.  Subjective:   She presents today for IUD insertion.  Pelvic pain, dysmenorrhea, ovarian cysts, uterine fibroids.    Hx: Noncontributory       Review of Systems:  Review of Systems  Constitutional: Denied constitutional symptoms, night sweats, recent illness, fatigue, fever, insomnia and weight loss.  Eyes: Denied eye symptoms, eye pain, photophobia, vision change and visual disturbance.  Ears/Nose/Throat/Neck: Denied ear, nose, throat or neck symptoms, hearing loss, nasal discharge, sinus congestion and sore throat.  Cardiovascular: Denied cardiovascular symptoms, arrhythmia, chest pain/pressure, edema, exercise intolerance, orthopnea and palpitations.  Respiratory: Denied pulmonary symptoms, asthma, pleuritic pain, productive sputum, cough, dyspnea and wheezing.  Gastrointestinal: Denied, gastro-esophageal reflux, melena, nausea and vomiting.  Genitourinary: See HPI for additional information.  Musculoskeletal: Denied musculoskeletal symptoms, stiffness, swelling, muscle weakness and myalgia.  Dermatologic: Denied dermatology symptoms, rash and scar.  Neurologic: Denied neurology symptoms, dizziness, headache, neck pain and syncope.  Psychiatric: Denied psychiatric symptoms, anxiety and depression.  Endocrine: Denied endocrine symptoms including hot flashes and night sweats.   Meds:   Current Outpatient Medications on File Prior to Visit  Medication Sig Dispense Refill  . gabapentin (NEURONTIN) 100 MG capsule Take 100 mg by mouth 3 (three) times daily.    . predniSONE (DELTASONE) 20 MG tablet Take 1 tablet (20 mg total) by mouth daily. 5 tablet 0   No current facility-administered medications on file prior to visit.     Objective:     Vitals:   04/22/19 1146  BP: 136/88  Pulse: 89    Physical examination   Pelvic:   Vulva: Normal appearance.  No lesions.   Vagina: No lesions or abnormalities noted.  Support: Normal pelvic support.  Urethra No masses tenderness or scarring.  Meatus Normal size without lesions or prolapse.  Cervix: Normal appearance.  No lesions.  Anus: Normal exam.  No lesions.  Perineum: Normal exam.  No lesions.        Bimanual   Uterus:  Top normal size non-tender.  Mobile.  AV.  Adnexae: No masses.  Non-tender to palpation.  Cul-de-sac: Negative for abnormality.   IUD Procedure Pt has read the booklet and signed the appropriate forms regarding the Mirena IUD.  All of her questions have been answered.   The cervix was cleansed with betadine solution.  After sounding the uterus and noting the position, the IUD was placed in the usual manner without problem.  The string was cut to the appropriate length.  She had a short vasovagal reaction with lightheadedness after insertion but otherwise he patient tolerated the procedure well.  Lightheadedness went away within 2 minutes.              Assessment:    G4P0010 Patient Active Problem List   Diagnosis Date Noted  . GI bleed 03/23/2018  . Leucocytosis 03/04/2016     1. Encounter for insertion of mirena IUD       Plan:             F/U  Return in about 4 weeks (around 05/20/2019) for For IUD f/u.   Order follow-up ultrasound at that visit for 6-week follow-up of ovarian cyst.  Finis Bud, M.D. 04/22/2019 11:53 AM

## 2019-05-05 ENCOUNTER — Encounter: Payer: Self-pay | Admitting: Emergency Medicine

## 2019-05-05 ENCOUNTER — Emergency Department
Admission: EM | Admit: 2019-05-05 | Discharge: 2019-05-05 | Disposition: A | Discharge status: Home / Self Care | Payer: Medicaid Other | Attending: Student in an Organized Health Care Education/Training Program | Admitting: Student in an Organized Health Care Education/Training Program

## 2019-05-05 ENCOUNTER — Other Ambulatory Visit: Payer: Self-pay

## 2019-05-05 DIAGNOSIS — M7918 Myalgia, other site: Secondary | ICD-10-CM

## 2019-05-05 DIAGNOSIS — M25511 Pain in right shoulder: Secondary | ICD-10-CM | POA: Insufficient documentation

## 2019-05-05 DIAGNOSIS — M79604 Pain in right leg: Secondary | ICD-10-CM | POA: Insufficient documentation

## 2019-05-05 DIAGNOSIS — F17210 Nicotine dependence, cigarettes, uncomplicated: Secondary | ICD-10-CM | POA: Diagnosis not present

## 2019-05-05 DIAGNOSIS — Y92008 Other place in unspecified non-institutional (private) residence as the place of occurrence of the external cause: Secondary | ICD-10-CM | POA: Diagnosis not present

## 2019-05-05 DIAGNOSIS — Y9389 Activity, other specified: Secondary | ICD-10-CM | POA: Diagnosis not present

## 2019-05-05 DIAGNOSIS — Y999 Unspecified external cause status: Secondary | ICD-10-CM | POA: Insufficient documentation

## 2019-05-05 DIAGNOSIS — W19XXXA Unspecified fall, initial encounter: Secondary | ICD-10-CM

## 2019-05-05 DIAGNOSIS — W01198A Fall on same level from slipping, tripping and stumbling with subsequent striking against other object, initial encounter: Secondary | ICD-10-CM | POA: Diagnosis not present

## 2019-05-05 DIAGNOSIS — Z79899 Other long term (current) drug therapy: Secondary | ICD-10-CM | POA: Insufficient documentation

## 2019-05-05 DIAGNOSIS — Y92009 Unspecified place in unspecified non-institutional (private) residence as the place of occurrence of the external cause: Secondary | ICD-10-CM

## 2019-05-05 MED ORDER — CYCLOBENZAPRINE HCL 10 MG PO TABS: 10.0000 mg | ORAL_TABLET | Freq: Three times a day (TID) | ORAL | 0 refills | Status: DC | PRN

## 2019-05-05 MED ORDER — TRAMADOL HCL 50 MG PO TABS: 50.0000 mg | ORAL_TABLET | Freq: Four times a day (QID) | ORAL | 0 refills | Status: DC | PRN

## 2019-05-05 NOTE — ED Notes (Signed)
See triage note  States states tripped over her fan  Landed on right side  States her entire right side hurts  Abrasion noted to right knee

## 2019-05-05 NOTE — ED Triage Notes (Signed)
Says she tripped over a box and fell onto her side.  Whole side hurts.

## 2019-05-06 NOTE — ED Provider Notes (Signed)
[facility] Emergency Department Provider Note ____________________________________________  Time seen: Approximately 1:22 PM  I have reviewed the triage vital signs and the nursing notes.   HISTORY  Chief Complaint Fall    HPI [name] is a 43 y.o. female who presents to the emergency department for evaluation and treatment of right shoulder and leg pain after a mechanical, non-syncopal fall after tripping over a box fan. No loss of consciousness. No alleviating measures prior to arrival.  Past Medical History:  Diagnosis Date  . Depression   . 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 Leukocytosis   . Tobacco use     Patient Active Problem List   Diagnosis Date Noted  . GI bleed 03/23/2018  . Leucocytosis 03/04/2016    Past Surgical History:  Procedure Laterality Date  . BREAST BIOPSY Right 2016   [place] core, fat necrosis  . COLONOSCOPY WITH PROPOFOL N/A 03/25/2018   Procedure: COLONOSCOPY WITH PROPOFOL;  Surgeon: Toledo, Benay Pike, MD;  Location: [facility] ENDOSCOPY;  Service: Gastroenterology;  Laterality: N/A;  . ESOPHAGOGASTRODUODENOSCOPY (EGD) WITH PROPOFOL N/A 03/25/2018   Procedure: ESOPHAGOGASTRODUODENOSCOPY (EGD) WITH PROPOFOL;  Surgeon: Toledo, Benay Pike, MD;  Location: [facility] ENDOSCOPY;  Service: Gastroenterology;  Laterality: N/A;  . INTRAUTERINE DEVICE INSERTION    . KNEE CARTILAGE SURGERY Right 02/2015  . LAPAROSCOPIC TUBAL LIGATION      Prior to Admission medications   Medication Sig Start Date End Date Taking? Authorizing Provider  cyclobenzaprine (FLEXERIL) 10 MG tablet Take 1 tablet (10 mg total) by mouth 3 (three) times daily as needed. 05/05/19   Chele Cornell B, FNP  gabapentin (NEURONTIN) 100 MG capsule Take 100 mg by mouth 3 (three) times daily.    [provider]  traMADol (ULTRAM) 50 MG tablet Take 1 tablet (50 mg total) by mouth every 6 (six) hours as needed. 05/05/19   Victorino Dike, FNP     Allergies Patient has no known allergies.  Family History  Problem Relation Age of Onset  . Prostate cancer Father   . Breast cancer Maternal Grandmother 39  . Colon cancer Neg Hx     Social History Social History   Tobacco Use  . Smoking status: Current Every Day Smoker    Packs/day: 1.00    Years: 7.00    Pack years: 7.00    Types: Cigarettes  . Smokeless tobacco: Never Used  Substance Use Topics  . Alcohol use: No    Alcohol/week: 0.0 standard drinks  . Drug use: No    Review of Systems Constitutional: Negative for fever. Cardiovascular: Negative for chest pain. Respiratory: Negative for shortness of breath. Musculoskeletal: Positive for shoulder and knee pain. Skin: Positive for abrasions and erythema.  Neurological: Negative for decrease in sensation  ____________________________________________   PHYSICAL EXAM:  VITAL SIGNS: ED Triage Vitals  Enc Vitals Group     BP 05/05/19 1007 (!) 156/86     Pulse Rate 05/05/19 1007 100     Resp 05/05/19 1007 16     Temp 05/05/19 1007 99 F (37.2 C)     Temp Source 05/05/19 1007 Oral     SpO2 05/05/19 1007 97 %     Weight 05/05/19 1018 169 lb 8.5 oz (76.9 kg)     Height 05/05/19 1018 5\' 2"  (1.575 m)     Head Circumference --      Peak Flow --      Pain Score 05/05/19 1103 5  Pain Loc --      Pain Edu? --      Excl. in [place]? --     Constitutional: Alert and oriented. Well appearing and in no acute distress. Eyes: Conjunctivae are clear without discharge or drainage Head: Atraumatic Neck: Supple. No midline tenderness. Respiratory: No cough. Respirations are even and unlabored. Musculoskeletal: Diffuse right shoulder pain with abduction of right arm. Right knee FROM. No focal tenderness. Neurologic: Awake, alert, and oriented x 4.  Skin: Scattered abrasions over right lower leg. Erythema and abrasion to the right shoulder.  Psychiatric: Affect and behavior are  appropriate.  ____________________________________________   LABS (all labs ordered are listed, but only abnormal results are displayed)  Labs Reviewed - No data to display ____________________________________________  RADIOLOGY  Not indicated. ____________________________________________   PROCEDURES  Procedures  ____________________________________________   INITIAL IMPRESSION / ASSESSMENT AND PLAN / ED COURSE  [name] is a 43 y.o. who presents to the emergency department for evaluation of musculoskeletal pain after mechanical fall prior to arrival. She will be treated with flexeril and tramadol and encouraged to follow up with PCP of her choice for symptoms that are not improving over the next few days.  Medications - No data to display  Pertinent labs & imaging results that were available during my care of the patient were reviewed by me and considered in my medical decision making (see chart for details).  _________________________________________   FINAL CLINICAL IMPRESSION(S) / ED DIAGNOSES  Final diagnoses:  Fall in home, initial encounter  Musculoskeletal pain    ED Discharge Orders         Ordered    cyclobenzaprine (FLEXERIL) 10 MG tablet  3 times daily PRN     05/05/19 1037    traMADol (ULTRAM) 50 MG tablet  Every 6 hours PRN     05/05/19 1037           If controlled substance prescribed during this visit, 12 month history viewed on the [place] prior to issuing an initial prescription for Schedule II or III opiod.   Victorino Dike, FNP 05/06/19 1331    Merlyn Lot, MD 05/09/19 1558

## 2019-05-20 ENCOUNTER — Encounter: Payer: Medicaid Other | Admitting: Obstetrics and Gynecology

## 2019-05-24 ENCOUNTER — Encounter: Payer: Medicaid Other | Admitting: Obstetrics and Gynecology

## 2019-06-09 ENCOUNTER — Encounter: Payer: Self-pay | Admitting: Obstetrics and Gynecology

## 2019-06-09 ENCOUNTER — Other Ambulatory Visit: Payer: Self-pay

## 2019-06-09 ENCOUNTER — Ambulatory Visit (INDEPENDENT_AMBULATORY_CARE_PROVIDER_SITE_OTHER): Payer: Medicaid Other | Admitting: Obstetrics and Gynecology

## 2019-06-09 VITALS — BP 129/81 | HR 90 | Ht 62.0 in | Wt 170.4 lb

## 2019-06-09 DIAGNOSIS — Z30431 Encounter for routine checking of intrauterine contraceptive device: Secondary | ICD-10-CM

## 2019-06-09 DIAGNOSIS — R300 Dysuria: Secondary | ICD-10-CM

## 2019-06-09 DIAGNOSIS — N83209 Unspecified ovarian cyst, unspecified side: Secondary | ICD-10-CM | POA: Diagnosis not present

## 2019-06-09 LAB — POCT URINALYSIS DIPSTICK
Bilirubin, UA: NEGATIVE
Blood, UA: NEGATIVE
Glucose, UA: NEGATIVE
Ketones, UA: NEGATIVE
Leukocytes, UA: NEGATIVE
Nitrite, UA: NEGATIVE
Protein, UA: NEGATIVE
Spec Grav, UA: 1.01 (ref 1.010–1.025)
Urobilinogen, UA: 0.2 E.U./dL
pH, UA: 6.5 (ref 5.0–8.0)

## 2019-06-09 NOTE — Progress Notes (Signed)
HPI:      [name] is a 43 y.o. G4P0010 who LMP was No LMP recorded. (Menstrual status: IUD).  Subjective:   She presents today for follow-up after IUD placement.  It is now been about 6 weeks.  The patient has had spotting but she says this has now resolved.  She is not sure she is happy with her IUD but cannot explain why.  She has resumed intercourse with her IUD and has had no issues. Of significant note patient had an ovarian cyst that we were following.  She has had no issues from the cyst.    Hx: The following portions of the patient's history were reviewed and updated as appropriate:             She  has a past medical history of Depression, Focal nodular hyperplasia determined by liver biopsy, Leukocytosis, and Tobacco use. She does not have any pertinent problems on file. She  has a past surgical history that includes Knee cartilage surgery (Right, 02/2015); Laparoscopic tubal ligation; Breast biopsy (Right, 2016); Esophagogastroduodenoscopy (egd) with propofol (N/A, 03/25/2018); Colonoscopy with propofol (N/A, 03/25/2018); and Intrauterine device insertion. Her family history includes Breast cancer (age of onset: 65) in her maternal grandmother; Prostate cancer in her father. She  reports that she has been smoking cigarettes. She has a 7.00 pack-year smoking history. She has never used smokeless tobacco. She reports that she does not drink alcohol or use drugs. She has a current medication list which includes the following prescription(s): bupropion, cyclobenzaprine, fluoxetine, and gabapentin. She has No Known Allergies.       Review of Systems:  Review of Systems  Constitutional: Denied constitutional symptoms, night sweats, recent illness, fatigue, fever, insomnia and weight loss.  Eyes: Denied eye symptoms, eye pain, photophobia, vision change and visual disturbance.  Ears/Nose/Throat/Neck: Denied ear, nose, throat or neck symptoms, hearing loss, nasal discharge, sinus  congestion and sore throat.  Cardiovascular: Denied cardiovascular symptoms, arrhythmia, chest pain/pressure, edema, exercise intolerance, orthopnea and palpitations.  Respiratory: Denied pulmonary symptoms, asthma, pleuritic pain, productive sputum, cough, dyspnea and wheezing.  Gastrointestinal: Denied, gastro-esophageal reflux, melena, nausea and vomiting.  Genitourinary: Denied genitourinary symptoms including symptomatic vaginal discharge, pelvic relaxation issues, and urinary complaints.  Musculoskeletal: Denied musculoskeletal symptoms, stiffness, swelling, muscle weakness and myalgia.  Dermatologic: Denied dermatology symptoms, rash and scar.  Neurologic: Denied neurology symptoms, dizziness, headache, neck pain and syncope.  Psychiatric: Denied psychiatric symptoms, anxiety and depression.  Endocrine: Denied endocrine symptoms including hot flashes and night sweats.   Meds:   Current Outpatient Medications on File Prior to Visit  Medication Sig Dispense Refill  . buPROPion (WELLBUTRIN XL) 150 MG 24 hr tablet TAKE 1 TABLET BY MOUTH ONCE DAILY FOR MOOD    . cyclobenzaprine (FLEXERIL) 10 MG tablet Take 1 tablet (10 mg total) by mouth 3 (three) times daily as needed. 30 tablet 0  . FLUoxetine (PROZAC) 40 MG capsule TAKE 1 CAPSULE BY MOUTH ONCE DAILY FOR DEPRESSION    . gabapentin (NEURONTIN) 100 MG capsule Take 100 mg by mouth 3 (three) times daily.     No current facility-administered medications on file prior to visit.     Objective:     Vitals:   06/09/19 1551  BP: 129/81  Pulse: 90              Physical examination   Pelvic:   Vulva: Normal appearance.  No lesions.  Vagina: No lesions or abnormalities noted.  Support: Normal  pelvic support.  Urethra No masses tenderness or scarring.  Meatus Normal size without lesions or prolapse.  Cervix: Normal appearance.  No lesions. IUD strings noted at cervical os.  Anus: Normal exam.  No lesions.  Perineum: Normal exam.  No  lesions.        Bimanual   Uterus: Normal size.  Non-tender.  Mobile.  AV.  Adnexae: No masses.  Non-tender to palpation.  Cul-de-sac: Negative for abnormality.     Assessment:    G4P0010 Patient Active Problem List   Diagnosis Date Noted  . GI bleed 03/23/2018  . Leucocytosis 03/04/2016     1. Dysuria   2. Surveillance of previously prescribed intrauterine contraceptive device   3. Cyst of ovary, unspecified laterality     Asymptomatic cyst.  IUD without issue   Plan:            1.  Expect IUD to increase cycle control.  2.  Follow-up ultrasound for ovarian cyst. Orders Orders Placed This Encounter  Procedures  . US PELVIS (TRANSABDOMINAL ONLY)  . POCT urinalysis dipstick    No orders of the defined types were placed in this encounter.     F/U  Return in about 2 weeks (around 06/23/2019). I spent 16 minutes involved in the care of this patient of which greater than 50% was spent discussing ovarian cyst, IUD, expected follow-up and possible outcomes.  All questions answered.  Finis Bud, M.D. 06/09/2019 4:10 PM

## 2019-06-23 ENCOUNTER — Other Ambulatory Visit: Payer: Self-pay

## 2019-06-23 ENCOUNTER — Ambulatory Visit (INDEPENDENT_AMBULATORY_CARE_PROVIDER_SITE_OTHER): Payer: Medicaid Other

## 2019-06-23 ENCOUNTER — Encounter: Payer: Self-pay | Admitting: Obstetrics and Gynecology

## 2019-06-23 ENCOUNTER — Ambulatory Visit (INDEPENDENT_AMBULATORY_CARE_PROVIDER_SITE_OTHER): Payer: Medicaid Other | Admitting: Obstetrics and Gynecology

## 2019-06-23 VITALS — BP 143/87 | HR 99 | Wt 170.1 lb

## 2019-06-23 DIAGNOSIS — N83299 Other ovarian cyst, unspecified side: Secondary | ICD-10-CM | POA: Diagnosis not present

## 2019-06-23 DIAGNOSIS — N83209 Unspecified ovarian cyst, unspecified side: Secondary | ICD-10-CM

## 2019-06-23 NOTE — Progress Notes (Signed)
Patient comes in today for [place] results.

## 2019-06-23 NOTE — Progress Notes (Signed)
HPI:      [name] is a 43 y.o. G4P0010 who LMP was No LMP recorded. (Menstrual status: IUD).  Subjective:   She presents today for follow-up after ultrasound.  She was previously found to have a small complex ovarian cyst that is relatively asymptomatic.  Patient not having significant pain with this cyst. She has known uterine fibroids and was having irregular bleeding.  An IUD was placed to help to regulate her bleeding.  She reports no problems from the IUD.  She still has occasional spotting at this time.    Hx: The following portions of the patient's history were reviewed and updated as appropriate:             She  has a past medical history of Depression, Focal nodular hyperplasia determined by liver biopsy, Leukocytosis, and Tobacco use. She does not have any pertinent problems on file. She  has a past surgical history that includes Knee cartilage surgery (Right, 02/2015); Laparoscopic tubal ligation; Breast biopsy (Right, 2016); Esophagogastroduodenoscopy (egd) with propofol (N/A, 03/25/2018); Colonoscopy with propofol (N/A, 03/25/2018); and Intrauterine device insertion. Her family history includes Breast cancer (age of onset: 48) in her maternal grandmother; Prostate cancer in her father. She  reports that she has been smoking cigarettes. She has a 7.00 pack-year smoking history. She has never used smokeless tobacco. She reports that she does not drink alcohol or use drugs. She has a current medication list which includes the following prescription(s): bupropion, cyclobenzaprine, fluoxetine, and gabapentin. She has No Known Allergies.       Review of Systems:  Review of Systems  Constitutional: Denied constitutional symptoms, night sweats, recent illness, fatigue, fever, insomnia and weight loss.  Eyes: Denied eye symptoms, eye pain, photophobia, vision change and visual disturbance.  Ears/Nose/Throat/Neck: Denied ear, nose, throat or neck symptoms, hearing loss, nasal  discharge, sinus congestion and sore throat.  Cardiovascular: Denied cardiovascular symptoms, arrhythmia, chest pain/pressure, edema, exercise intolerance, orthopnea and palpitations.  Respiratory: Denied pulmonary symptoms, asthma, pleuritic pain, productive sputum, cough, dyspnea and wheezing.  Gastrointestinal: Denied, gastro-esophageal reflux, melena, nausea and vomiting.  Genitourinary: Denied genitourinary symptoms including symptomatic vaginal discharge, pelvic relaxation issues, and urinary complaints.  Musculoskeletal: Denied musculoskeletal symptoms, stiffness, swelling, muscle weakness and myalgia.  Dermatologic: Denied dermatology symptoms, rash and scar.  Neurologic: Denied neurology symptoms, dizziness, headache, neck pain and syncope.  Psychiatric: Denied psychiatric symptoms, anxiety and depression.  Endocrine: Denied endocrine symptoms including hot flashes and night sweats.   Meds:   Current Outpatient Medications on File Prior to Visit  Medication Sig Dispense Refill  . buPROPion (WELLBUTRIN XL) 150 MG 24 hr tablet TAKE 1 TABLET BY MOUTH ONCE DAILY FOR MOOD    . cyclobenzaprine (FLEXERIL) 10 MG tablet Take 1 tablet (10 mg total) by mouth 3 (three) times daily as needed. 30 tablet 0  . FLUoxetine (PROZAC) 40 MG capsule TAKE 1 CAPSULE BY MOUTH ONCE DAILY FOR DEPRESSION    . gabapentin (NEURONTIN) 100 MG capsule Take 100 mg by mouth 3 (three) times daily.     No current facility-administered medications on file prior to visit.     Objective:     Vitals:   06/23/19 1530  BP: (!) 143/87  Pulse: 99              Ultrasound results from last visit and from today reviewed directly with the patient.  Assessment:    G4P0010 Patient Active Problem List   Diagnosis Date Noted  .  GI bleed 03/23/2018  . Leucocytosis 03/04/2016     1. Complex ovarian cyst     Complex cyst is enlarging.  Patient remains relatively asymptomatic.  IUD placed correctly.  Occasional  spotting continues.   Plan:            1.  ROMA score for complex ovarian cyst  2.  Expect resolution of vaginal spotting as IUD continues to function.  3.  Patient states she always has "an elevated white blood cell count".  CBC obtained.  4.  If Roma score negative repeat ultrasound in 6 weeks.  If cyst not resolving consider definitive surgery. Orders Orders Placed This Encounter  Procedures  . US PELVIS (TRANSABDOMINAL ONLY)  . Ovarian Malignancy Risk-ROMA  . CBC    No orders of the defined types were placed in this encounter.     F/U  Return for We will contact her with any abnormal test results. I spent 18 minutes involved in the care of this patient of which greater than 50% was spent discussing ultrasound results, complex cysts and their differential diagnosis, pelvic pain, possible need for surgery, function ofROMA score.  All questions answered.  Finis Bud, M.D. 06/23/2019 3:50 PM

## 2019-06-24 LAB — PREMENOPAUSAL INTERP: LOW

## 2019-06-24 LAB — CBC
Hematocrit: 45.3 % (ref 34.0–46.6)
Hemoglobin: 15.6 g/dL (ref 11.1–15.9)
MCH: 30.9 pg (ref 26.6–33.0)
MCHC: 34.4 g/dL (ref 31.5–35.7)
MCV: 90 fL (ref 79–97)
Platelets: 482 10*3/uL — ABNORMAL HIGH (ref 150–450)
RBC: 5.05 x10E6/uL (ref 3.77–5.28)
RDW: 13.4 % (ref 11.7–15.4)
WBC: 17.4 10*3/uL — ABNORMAL HIGH (ref 3.4–10.8)

## 2019-06-24 LAB — OVARIAN MALIGNANCY RISK-ROMA
Cancer Antigen (CA) 125: 14.1 U/mL (ref 0.0–38.1)
HE4: 50.4 pmol/L (ref 0.0–63.6)
Postmenopausal ROMA: 1.11
Premenopausal ROMA: 0.76

## 2019-06-24 LAB — POSTMENOPAUSAL INTERP: LOW

## 2019-07-14 ENCOUNTER — Ambulatory Visit
Admission: EM | Admit: 2019-07-14 | Discharge: 2019-07-14 | Disposition: A | Discharge status: Home / Self Care | Payer: Medicaid Other

## 2019-07-14 ENCOUNTER — Other Ambulatory Visit: Payer: Self-pay

## 2019-07-14 ENCOUNTER — Encounter: Payer: Self-pay | Admitting: Emergency Medicine

## 2019-07-14 DIAGNOSIS — B354 Tinea corporis: Secondary | ICD-10-CM | POA: Diagnosis not present

## 2019-07-14 DIAGNOSIS — M7918 Myalgia, other site: Secondary | ICD-10-CM | POA: Diagnosis not present

## 2019-07-14 MED ORDER — CLOTRIMAZOLE-BETAMETHASONE 1-0.05 % EX CREA: TOPICAL_CREAM | CUTANEOUS | 0 refills | Status: DC

## 2019-07-14 MED ORDER — IBUPROFEN 600 MG PO TABS: 600.0000 mg | ORAL_TABLET | Freq: Three times a day (TID) | ORAL | 0 refills | Status: DC | PRN

## 2019-07-14 NOTE — ED Triage Notes (Signed)
Patient in today c/o rash/insect bite on her chest that she noticed yesterday. Patient states the area is hot to the touch and slight pain.

## 2019-07-14 NOTE — Discharge Instructions (Addendum)
It was very nice seeing you today in clinic. Thank you for entrusting me with your care.   Use topical as prescribed. Keep area covered, as it is considered contagious. Sent in some ibuprofen tablets per your request.   Make arrangements to follow up with your regular doctor in 1 week for re-evaluation if not improving. If your symptoms/condition worsens, please seek follow up care either here or in the ER. Please remember, our [facility] providers are "right here with you" when you need [place].   Again, it was my pleasure to take care of you today. Thank you for choosing our clinic. I hope that you start to feel better quickly.   Honor Loh, MSN, APRN, FNP-C, CEN Advanced Practice Provider [facility]

## 2019-07-14 NOTE — ED Provider Notes (Signed)
[place], [place]   Name: [name] DOB: [birth year] MRN: [id number] CSN: [id number] PCP: [name], NP  Arrival date and time:  07/14/19 0844  Chief Complaint:  Rash and Muscle Pain   NOTE: Prior to seeing the patient today, I have reviewed the triage nursing documentation and vital signs. Clinical staff has updated patient's PMH/PSHx, current medication list, and drug allergies/intolerances to ensure comprehensive history available to assist in medical decision making.   History:   HPI: [name] is a 43 y.o. female who presents today with complaints of a rash to her LEFT breast that she first appreciated yesterday. Patient denies any new medications, foods, soaps/body washes, laundry detergents, or cosmetics. She denies a past medical history significant for environmental allergies. She advises that she has never developed a skin eruption like this in the past. She has not appreciated any drainage associated with the rash. There is no facial involvement; no rash to periorbital, paranasal, or perioral areas. Patient denies that she is not experiencing any shortness of breath or sensation of pharyngeal/laryngeal fullness. Patient is a home health aide. She reports that on Monday she was helping a client move a mattress, which caused her to have some non-specific pain in her RIGHT side of her neck. Yesterday, she began to appreciate some slight pain, itching, and warmth to her LEFT breast. Despite her symptoms, patient has not tried any OTC interventions to reduce/relieve her associated symptoms.  Past Medical History:  Diagnosis Date  . Depression   . 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 Leukocytosis   . Tobacco use     Past Surgical History:  Procedure Laterality Date  . BREAST BIOPSY Right 2016   [place] core, fat necrosis  . COLONOSCOPY WITH PROPOFOL N/A 03/25/2018   Procedure: COLONOSCOPY WITH PROPOFOL;  Surgeon: Toledo, Benay Pike, MD;  Location:  [facility];  Service: Gastroenterology;  Laterality: N/A;  . ESOPHAGOGASTRODUODENOSCOPY (EGD) WITH PROPOFOL N/A 03/25/2018   Procedure: ESOPHAGOGASTRODUODENOSCOPY (EGD) WITH PROPOFOL;  Surgeon: Toledo, Benay Pike, MD;  Location: [facility] ENDOSCOPY;  Service: Gastroenterology;  Laterality: N/A;  . INTRAUTERINE DEVICE INSERTION    . KNEE CARTILAGE SURGERY Right 02/2015  . LAPAROSCOPIC TUBAL LIGATION      Family History  Problem Relation Age of Onset  . Hypertension Mother   . Prostate cancer Father   . Hypertension Father   . Breast cancer Maternal Grandmother 80  . Colon cancer Neg Hx     Social History   Tobacco Use  . Smoking status: Current Every Day Smoker    Packs/day: 1.00    Years: 7.00    Pack years: 7.00    Types: Cigarettes  . Smokeless tobacco: Never Used  Substance Use Topics  . Alcohol use: No    Alcohol/week: 0.0 standard drinks  . Drug use: No    Patient Active Problem List   Diagnosis Date Noted  . GI bleed 03/23/2018  . Leucocytosis 03/04/2016    Home Medications:    Current Meds  Medication Sig  . gabapentin (NEURONTIN) 100 MG capsule Take 100 mg by mouth 3 (three) times daily.  Marland Kitchen levonorgestrel (MIRENA) 20 MCG/24HR IUD 1 each by Intrauterine route once.    Allergies:   Patient has no known allergies.  Review of Systems (ROS): Review of Systems  Constitutional: Negative for chills and fever.  Respiratory: Negative for cough and shortness of breath.   Cardiovascular: Negative for chest pain and palpitations.  Musculoskeletal:  Acute pain in RIGHT neck/shoulder  Skin: Positive for rash.  All other systems reviewed and are negative.    Vital Signs: Today's Vitals   07/14/19 0852 07/14/19 0926  BP: (!) 144/94   Pulse: (!) 108   Resp: 18   Temp: 98.9 F (37.2 C)   TempSrc: Oral   SpO2: 100%   Weight: 160 lb (72.6 kg)   Height: 5\' 2"  (1.575 m)   PainSc: 4  4     Physical Exam: Physical Exam  Constitutional: She is oriented to  person, place, and time and well-developed, well-nourished, and in no distress.  HENT:  Head: Normocephalic and atraumatic.  Mouth/Throat: Mucous membranes are normal.  Eyes: Pupils are equal, round, and reactive to light. EOM are normal.  Neck: Normal range of motion. Neck supple. Muscular tenderness (generalized RIGHT side of neck extending down into shoulder) present. No spinous process tenderness present.  Cardiovascular: Regular rhythm, normal heart sounds and intact distal pulses. Tachycardia present. Exam reveals no gallop and no friction rub.  No murmur heard. Pulmonary/Chest: Effort normal and breath sounds normal. No respiratory distress. She has no wheezes. She has no rales.  Neurological: She is alert and oriented to person, place, and time. Gait normal.  Skin: Skin is warm and dry. Rash noted. Rash is maculopapular (Sub-centimeter areas on annular erythema to LEFT breast; (+) central clearing. Area blanches. Reports area to be tender and pruritic. No drainage or increased warmth noted).  Psychiatric: Mood, memory, affect and judgment normal.  Nursing note and vitals reviewed.   Urgent Care Treatments / Results:   LABS: PLEASE NOTE: all labs that were ordered this encounter are listed, however only abnormal results are displayed. Labs Reviewed - No data to display  EKG: -None  RADIOLOGY: No results found.  PROCEDURES: Procedures  MEDICATIONS RECEIVED THIS VISIT: Medications - No data to display  PERTINENT CLINICAL COURSE NOTES/UPDATES:   Initial Impression / Assessment and Plan / Urgent Care Course:  Pertinent labs & imaging results that were available during my care of the patient were personally reviewed by me and considered in my medical decision making (see lab/imaging section of note for values and interpretations).  [name] is a 43 y.o. female who presents to [facility] today with complaints of Rash and Muscle Pain   Patient is well  appearing overall in clinic today. She does not appear to be in any acute distress. Presenting symptoms (see HPI) and exam as documented above. Area of annular erythema consistent in appearance with tinea corporis. Discussed transmission risks and plans for treatment. Will send in prescription for Lotrisone topical. Patient advised to avoid touching area and to cover when there is any chance of skin to skin contact with anyone else. Additionally, patient has some pain in her neck and shoudler from where she help client move mattress. She is requesting 600 mg IBU tablets; Rx sent per request. She was encouraged to apply heat/ice TID-QID for at least 15-20 minutes at a time.  Current clinical condition warrants patient being out of work in order to prevent transmission of her current rash to her client. She was provided with the appropriate documentation to provide to her place of employment that will allow for her to RTW on 07/16/2019 as long as she keeps the rash covered.   Discussed follow up with primary care physician in 1 week for re-evaluation. I have reviewed the follow up and strict return precautions for any new or worsening symptoms. Patient  is aware of symptoms that would be deemed urgent/emergent, and would thus require further evaluation either here or in the emergency department. At the time of discharge, she verbalized understanding and consent with the discharge plan as it was reviewed with her. All questions were fielded by provider and/or clinic staff prior to patient discharge.    Final Clinical Impressions / Urgent Care Diagnoses:   Final diagnoses:  Tinea corporis  Musculoskeletal pain    New Prescriptions:  [place] Controlled Substance Registry consulted? Not Applicable  Meds ordered this encounter  Medications  . clotrimazole-betamethasone (LOTRISONE) cream    Sig: Apply to affected area 2 times daily x 1 week, then PRN.    Dispense:  15 g    Refill:  0  . ibuprofen (ADVIL) 600  MG tablet    Sig: Take 1 tablet (600 mg total) by mouth every 8 (eight) hours as needed.    Dispense:  21 tablet    Refill:  0    Recommended Follow up Care:  Patient encouraged to follow up with the following provider within the specified time frame, or sooner as dictated by the severity of her symptoms. As always, she was instructed that for any urgent/emergent care needs, she should seek care either here or in the emergency department for more immediate evaluation.  Follow-up Information    [name], NP In 1 week.   Specialty: Nurse Practitioner Why: General reassessment of symptoms if not improving Contact information: [place] [id number] [phone]         NOTE: This note was prepared using Dragon dictation software along with smaller phrase technology. Despite my best ability to proofread, there is the potential that transcriptional errors may still occur from this process, and are completely unintentional.    Karen Kitchens, NP 07/14/19 [phone]

## 2019-08-03 ENCOUNTER — Ambulatory Visit (INDEPENDENT_AMBULATORY_CARE_PROVIDER_SITE_OTHER): Payer: Medicaid Other

## 2019-08-03 ENCOUNTER — Encounter: Payer: Medicaid Other | Admitting: Obstetrics and Gynecology

## 2019-08-03 ENCOUNTER — Other Ambulatory Visit: Payer: Self-pay

## 2019-08-03 DIAGNOSIS — D250 Submucous leiomyoma of uterus: Secondary | ICD-10-CM | POA: Diagnosis not present

## 2019-08-03 DIAGNOSIS — D251 Intramural leiomyoma of uterus: Secondary | ICD-10-CM

## 2019-08-03 DIAGNOSIS — N83292 Other ovarian cyst, left side: Secondary | ICD-10-CM

## 2019-08-03 DIAGNOSIS — N83299 Other ovarian cyst, unspecified side: Secondary | ICD-10-CM

## 2019-08-09 ENCOUNTER — Telehealth: Payer: Self-pay

## 2019-08-09 ENCOUNTER — Ambulatory Visit (INDEPENDENT_AMBULATORY_CARE_PROVIDER_SITE_OTHER): Payer: Medicaid Other | Admitting: Obstetrics and Gynecology

## 2019-08-09 ENCOUNTER — Encounter: Payer: Self-pay | Admitting: Obstetrics and Gynecology

## 2019-08-09 ENCOUNTER — Other Ambulatory Visit: Payer: Self-pay

## 2019-08-09 VITALS — BP 147/89 | HR 105 | Ht 62.0 in | Wt 171.9 lb

## 2019-08-09 DIAGNOSIS — N83299 Other ovarian cyst, unspecified side: Secondary | ICD-10-CM | POA: Diagnosis not present

## 2019-08-09 DIAGNOSIS — N83292 Other ovarian cyst, left side: Secondary | ICD-10-CM

## 2019-08-09 DIAGNOSIS — R102 Pelvic and perineal pain: Secondary | ICD-10-CM | POA: Diagnosis not present

## 2019-08-09 DIAGNOSIS — D219 Benign neoplasm of connective and other soft tissue, unspecified: Secondary | ICD-10-CM | POA: Diagnosis not present

## 2019-08-09 NOTE — Telephone Encounter (Signed)
Please advise on prescription refill.

## 2019-08-09 NOTE — Telephone Encounter (Signed)
Notified patient of massage. Patient verbalized understanding.

## 2019-08-09 NOTE — Telephone Encounter (Signed)
Pt request prescription for IBUPROFEN to [facility] [address]

## 2019-08-09 NOTE — Progress Notes (Signed)
HPI:      [name] is a 43 y.o. G4P0010 who LMP was No LMP recorded. (Menstrual status: IUD).  Subjective:   She presents today for a follow-up after her ultrasound.  She states that she is having more back pain and right-sided pain.  She also reports that she had bleeding last week. She is stating that she does not want to live with this pain any longer and is considering "having surgery" so that she does not have to deal with it any further.    Hx: The following portions of the patient's history were reviewed and updated as appropriate:             She  has a past medical history of Depression, Focal nodular hyperplasia determined by liver biopsy, Leukocytosis, and Tobacco use. She does not have any pertinent problems on file. She  has a past surgical history that includes Knee cartilage surgery (Right, 02/2015); Laparoscopic tubal ligation; Breast biopsy (Right, 2016); Esophagogastroduodenoscopy (egd) with propofol (N/A, 03/25/2018); Colonoscopy with propofol (N/A, 03/25/2018); and Intrauterine device insertion. Her family history includes Breast cancer (age of onset: 3) in her maternal grandmother; Hypertension in her father and mother; Prostate cancer in her father. She  reports that she has been smoking cigarettes. She has a 7.00 pack-year smoking history. She has never used smokeless tobacco. She reports that she does not drink alcohol or use drugs. She has a current medication list which includes the following prescription(s): clotrimazole-betamethasone, gabapentin, ibuprofen, and levonorgestrel. She has No Known Allergies.       Review of Systems:  Review of Systems  Constitutional: Denied constitutional symptoms, night sweats, recent illness, fatigue, fever, insomnia and weight loss.  Eyes: Denied eye symptoms, eye pain, photophobia, vision change and visual disturbance.  Ears/Nose/Throat/Neck: Denied ear, nose, throat or neck symptoms, hearing loss, nasal discharge, sinus  congestion and sore throat.  Cardiovascular: Denied cardiovascular symptoms, arrhythmia, chest pain/pressure, edema, exercise intolerance, orthopnea and palpitations.  Respiratory: Denied pulmonary symptoms, asthma, pleuritic pain, productive sputum, cough, dyspnea and wheezing.  Gastrointestinal: Denied, gastro-esophageal reflux, melena, nausea and vomiting.  Genitourinary: See HPI for additional information.  Musculoskeletal: Denied musculoskeletal symptoms, stiffness, swelling, muscle weakness and myalgia.  Dermatologic: Denied dermatology symptoms, rash and scar.  Neurologic: Denied neurology symptoms, dizziness, headache, neck pain and syncope.  Psychiatric: Denied psychiatric symptoms, anxiety and depression.  Endocrine: Denied endocrine symptoms including hot flashes and night sweats.   Meds:   Current Outpatient Medications on File Prior to Visit  Medication Sig Dispense Refill  . clotrimazole-betamethasone (LOTRISONE) cream Apply to affected area 2 times daily x 1 week, then PRN. 15 g 0  . gabapentin (NEURONTIN) 100 MG capsule Take 100 mg by mouth 3 (three) times daily.    Marland Kitchen ibuprofen (ADVIL) 600 MG tablet Take 1 tablet (600 mg total) by mouth every 8 (eight) hours as needed. 21 tablet 0  . levonorgestrel (MIRENA) 20 MCG/24HR IUD 1 each by Intrauterine route once.     No current facility-administered medications on file prior to visit.     Objective:     Vitals:   08/09/19 0844  BP: (!) 147/89  Pulse: (!) 105              Ultrasound reveals her ovarian cyst is getting smaller.  Multiple uterine fibroids noted.  IUD positioned correctly.  Assessment:    G4P0010 Patient Active Problem List   Diagnosis Date Noted  . GI bleed 03/23/2018  . Leucocytosis 03/04/2016  1. Pelvic pain in female   2. Complex ovarian cyst   3. Fibroids     Patient is experiencing continued bleeding despite IUD and has multiple uterine fibroids noted on ultrasound.  It is possible her  back pain and right-sided pain are secondary to uterine fibroids.  Also possibly secondary to ovarian cyst although because the cyst is getting smaller this seems unlikely. Patient has specifically stated that she does not want to deal with this any longer and is considering definitive surgery.   Plan:            1.  Discussed surgery in some detail.  Continued expectant management also discussed-patient not willing.  2.  Patient to return for preop LAVH unilateral oophorectomy. Orders No orders of the defined types were placed in this encounter.   No orders of the defined types were placed in this encounter.     F/U  Return in about 2 weeks (around 08/23/2019). I spent 18 minutes involved in the care of this patient of which greater than 50% was spent discussing above issues including bleeding, pelvic pain, complex ovarian cyst, uterine fibroids, possible definitive surgery.  All questions answered.  Finis Bud, M.D. 08/09/2019 9:27 AM

## 2019-08-09 NOTE — Progress Notes (Signed)
Patient comes today for follow up and [place] results. She is having pelvis pain.

## 2019-08-19 ENCOUNTER — Other Ambulatory Visit: Payer: Self-pay

## 2019-08-19 ENCOUNTER — Encounter: Payer: Self-pay | Admitting: Emergency Medicine

## 2019-08-19 ENCOUNTER — Ambulatory Visit
Admission: EM | Admit: 2019-08-19 | Discharge: 2019-08-19 | Disposition: A | Discharge status: Home / Self Care | Payer: Medicaid Other | Attending: Family Medicine | Admitting: Family Medicine

## 2019-08-19 DIAGNOSIS — Z20828 Contact with and (suspected) exposure to other viral communicable diseases: Secondary | ICD-10-CM | POA: Diagnosis not present

## 2019-08-19 DIAGNOSIS — J019 Acute sinusitis, unspecified: Secondary | ICD-10-CM | POA: Diagnosis not present

## 2019-08-19 DIAGNOSIS — Z20822 Contact with and (suspected) exposure to covid-19: Secondary | ICD-10-CM

## 2019-08-19 MED ORDER — FLUTICASONE PROPIONATE 50 MCG/ACT NA SUSP: 2.0000 | Freq: Every day | NASAL | 0 refills | Status: DC

## 2019-08-19 MED ORDER — METHYLPREDNISOLONE 4 MG PO TBPK: ORAL_TABLET | ORAL | 0 refills | Status: DC

## 2019-08-19 MED ORDER — AMOXICILLIN-POT CLAVULANATE 875-125 MG PO TABS: 1.0000 | ORAL_TABLET | Freq: Two times a day (BID) | ORAL | 0 refills | Status: DC

## 2019-08-19 NOTE — ED Triage Notes (Signed)
Patent c/o stuffy nose and cough that started 2-3 days ago. Denies fever.

## 2019-08-19 NOTE — ED Provider Notes (Signed)
[facility]    CSN: [id number] Arrival date & time: 08/19/19  1235      History   Chief Complaint Chief Complaint  Patient presents with  . Cough  . Nasal Congestion    HPI Sakoya Hinkel is a 43 y.o. female.   Subjective:   Naylanie Marentes is a 43 y.o. female who presents for evaluation of possible sinus infection. Symptoms include bilateral ear pressure/pain, congestion and post nasal drip with no fever, chills, night sweats or weight loss. No sore throat, cough, shortness of breath, nausea, vomiting, diarrhea, headache, dizziness or change in taste/smell. Onset of symptoms was 2 days ago and has been unchanged since that time.  She is drinking plenty of fluids.  Past history is significant for no history of pneumonia or bronchitis. Patient is a smoker. She hasn't tried anything for her symptoms. She denies any known exposure to COVID-19.   The following portions of the patient's history were reviewed and updated as appropriate: allergies, current medications, past family history, past medical history, past social history, past surgical history and problem list.          Past Medical History:  Diagnosis Date  . Depression   . 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 Leukocytosis   . Tobacco use     Patient Active Problem List   Diagnosis Date Noted  . GI bleed 03/23/2018  . Leucocytosis 03/04/2016    Past Surgical History:  Procedure Laterality Date  . BREAST BIOPSY Right 2016   [place] core, fat necrosis  . COLONOSCOPY WITH PROPOFOL N/A 03/25/2018   Procedure: COLONOSCOPY WITH PROPOFOL;  Surgeon: Toledo, Benay Pike, MD;  Location: [facility];  Service: Gastroenterology;  Laterality: N/A;  . ESOPHAGOGASTRODUODENOSCOPY (EGD) WITH PROPOFOL N/A 03/25/2018   Procedure: ESOPHAGOGASTRODUODENOSCOPY (EGD) WITH PROPOFOL;  Surgeon: Toledo, Benay Pike, MD;  Location: [facility];  Service: Gastroenterology;  Laterality: N/A;  .  INTRAUTERINE DEVICE INSERTION    . KNEE CARTILAGE SURGERY Right 02/2015  . LAPAROSCOPIC TUBAL LIGATION      OB History    Gravida  4   Para  3   Term      Preterm      AB  1   Living        SAB  1   TAB      Ectopic      Multiple      Live Births           Obstetric Comments  1st Menstrual Cycle:  18 1st Pregnancy:  18          Home Medications    Prior to Admission medications   Medication Sig Start Date End Date Taking? Authorizing Provider  levonorgestrel (MIRENA) 20 MCG/24HR IUD 1 each by Intrauterine route once.   Yes [provider]  amoxicillin-clavulanate (AUGMENTIN) 875-125 MG tablet Take 1 tablet by mouth every 12 (twelve) hours. 08/19/19   Enrique Sack, FNP  fluticasone (FLONASE) 50 MCG/ACT nasal spray Place 2 sprays into both nostrils daily. 08/19/19   Enrique Sack, FNP  gabapentin (NEURONTIN) 100 MG capsule Take 100 mg by mouth 3 (three) times daily.    [provider]  ibuprofen (ADVIL) 600 MG tablet Take 1 tablet (600 mg total) by mouth every 8 (eight) hours as needed. 07/14/19   Karen Kitchens, NP  methylPREDNISolone (MEDROL DOSEPAK) 4 MG TBPK tablet Take as directed 08/19/19   Enrique Sack, [place]    Family  History Family History  Problem Relation Age of Onset  . Hypertension Mother   . Prostate cancer Father   . Hypertension Father   . Breast cancer Maternal Grandmother 110  . Colon cancer Neg Hx     Social History Social History   Tobacco Use  . Smoking status: Current Every Day Smoker    Packs/day: 1.00    Years: 7.00    Pack years: 7.00    Types: Cigarettes  . Smokeless tobacco: Never Used  Substance Use Topics  . Alcohol use: No    Alcohol/week: 0.0 standard drinks  . Drug use: No     Allergies   Patient has no known allergies.   Review of Systems Review of Systems  Constitutional: Negative for chills, diaphoresis, fatigue and fever.  HENT: Positive for ear pain, postnasal drip and  sinus pressure. Negative for sore throat.   Respiratory: Positive for cough. Negative for shortness of breath.   Gastrointestinal: Negative for abdominal pain, diarrhea, nausea and vomiting.  Musculoskeletal: Negative for myalgias.  Neurological: Negative for dizziness and headaches.  All other systems reviewed and are negative.    Physical Exam Triage Vital Signs ED Triage Vitals  Enc Vitals Group     BP 08/19/19 1341 (!) 150/101     Pulse Rate 08/19/19 1341 92     Resp 08/19/19 1341 18     Temp 08/19/19 1341 98 F (36.7 C)     Temp Source 08/19/19 1341 Oral     SpO2 08/19/19 1341 98 %     Weight 08/19/19 1339 170 lb (77.1 kg)     Height 08/19/19 1339 5\' 2"  (1.575 m)     Head Circumference --      Peak Flow --      Pain Score 08/19/19 1338 0     Pain Loc --      Pain Edu? --      Excl. in [place]? --    No data found.  Updated Vital Signs BP (!) 150/101 (BP Location: Right Arm)   Pulse 92   Temp 98 F (36.7 C) (Oral)   Resp 18   Ht 5\' 2"  (1.575 m)   Wt 170 lb (77.1 kg)   SpO2 98%   BMI 31.09 kg/m   Visual Acuity Right Eye Distance:   Left Eye Distance:   Bilateral Distance:    Right Eye Near:   Left Eye Near:    Bilateral Near:     Physical Exam Vitals signs reviewed.  Constitutional:      Appearance: Normal appearance.  HENT:     Head: Normocephalic.     Right Ear: Tympanic membrane, ear canal and external ear normal.     Left Ear: Tympanic membrane and external ear normal.     Nose: Nose normal.     Mouth/Throat:     Mouth: Mucous membranes are moist.  Eyes:     Extraocular Movements: Extraocular movements intact.     Conjunctiva/sclera: Conjunctivae normal.     Pupils: Pupils are equal, round, and reactive to light.  Neck:     Musculoskeletal: Normal range of motion and neck supple.  Cardiovascular:     Rate and Rhythm: Normal rate and regular rhythm.     Pulses: Normal pulses.     Heart sounds: Normal heart sounds.  Pulmonary:     Effort:  Pulmonary effort is normal.     Breath sounds: Normal breath sounds.  Musculoskeletal: Normal range of motion.  Lymphadenopathy:  Cervical: No cervical adenopathy.  Skin:    General: Skin is warm and dry.  Neurological:     General: No focal deficit present.     Mental Status: She is alert and oriented to person, place, and time.  Psychiatric:        Mood and Affect: Mood normal.        Behavior: Behavior normal.      UC Treatments / Results  Labs (all labs ordered are listed, but only abnormal results are displayed) Labs Reviewed  NOVEL CORONAVIRUS, NAA (HOSP ORDER, SEND-OUT TO REF LAB; TAT 18-24 HRS)    EKG   Radiology No results found.  Procedures Procedures (including critical care time)  Medications Ordered in UC Medications - No data to display  Initial Impression / Assessment and Plan / UC Course  I have reviewed the triage vital signs and the nursing notes.  Pertinent labs & imaging results that were available during my care of the patient were reviewed by me and considered in my medical decision making (see chart for details).    43 yo female presenting with a two-day history of bilateral ear pressure/pain, congestion and post nasal drip. No fever, chills, night sweats, weight loss, sore throat, cough, shortness of breath, nausea, vomiting, diarrhea, headache, dizziness or change in taste/smell. No known exposure to COVID-19.  Patient is afebrile.  Nontoxic-appearing.  Supportive care advised. COVID-19 test pending.  Augmentin, Medrol Dosepak and Flonase.  In all and ibuprofen as needed.  Lots of fluids and rest.  Isolation.   Today's evaluation has revealed no signs of a dangerous process. Discussed diagnosis with patient and/or guardian. Patient and/or guardian aware of their diagnosis, possible red flag symptoms to watch out for and need for close follow up. Patient and/or guardian understands verbal and written discharge instructions. Patient and/or  guardian comfortable with plan and disposition.  Patient and/or guardian has a clear mental status at this time, good insight into illness (after discussion and teaching) and has clear judgment to make decisions regarding their care  This care was provided during an unprecedented National Emergency due to the Novel Coronavirus (COVID-19) pandemic. COVID-19 infections and transmission risks place heavy strains on healthcare resources.  As this pandemic evolves, our facility, providers, and staff strive to respond fluidly, to remain operational, and to provide care relative to available resources and information. Outcomes are unpredictable and treatments are without well-defined guidelines. Further, the impact of COVID-19 on all aspects of urgent care, including the impact to patients seeking care for reasons other than COVID-19, is unavoidable during this national emergency. At this time of the global pandemic, management of patients has significantly changed, even for non-COVID positive patients given high local and regional COVID volumes at this time requiring high healthcare system and resource utilization. The standard of care for management of both COVID suspected and non-COVID suspected patients continues to change rapidly at the local, regional, national, and global levels. This patient was worked up and treated to the best available but ever changing evidence and resources available at this current time.   Documentation was completed with the aid of voice recognition software. Transcription may contain typographical errors. Final Clinical Impressions(s) / UC Diagnoses   Final diagnoses:  Acute sinusitis, recurrence not specified, unspecified location  Encounter for screening laboratory testing for COVID-19 virus   Discharge Instructions   None    ED Prescriptions    Medication Sig Dispense Auth. Provider   amoxicillin-clavulanate (AUGMENTIN) 875-125 MG tablet Take 1  tablet by mouth every 12  (twelve) hours. 14 tablet Leeona Mccardle, [place], FNP   methylPREDNISolone (MEDROL DOSEPAK) 4 MG TBPK tablet Take as directed 21 tablet Jenah Vanasten, FNP   fluticasone (FLONASE) 50 MCG/ACT nasal spray Place 2 sprays into both nostrils daily. 9.9 mL Enrique Sack, FNP     PDMP not reviewed this encounter.   Enrique Sack, [place] 08/19/19 [phone]

## 2019-08-20 LAB — NOVEL CORONAVIRUS, NAA (HOSP ORDER, SEND-OUT TO REF LAB; TAT 18-24 HRS): SARS-CoV-2, NAA: NOT DETECTED

## 2019-08-23 ENCOUNTER — Other Ambulatory Visit: Payer: Self-pay

## 2019-08-23 ENCOUNTER — Ambulatory Visit (INDEPENDENT_AMBULATORY_CARE_PROVIDER_SITE_OTHER): Payer: Medicaid Other | Admitting: Obstetrics and Gynecology

## 2019-08-23 ENCOUNTER — Encounter: Payer: Self-pay | Admitting: Obstetrics and Gynecology

## 2019-08-23 VITALS — BP 132/84 | HR 94 | Ht 62.0 in | Wt 171.6 lb

## 2019-08-23 DIAGNOSIS — R102 Pelvic and perineal pain: Secondary | ICD-10-CM

## 2019-08-23 DIAGNOSIS — D219 Benign neoplasm of connective and other soft tissue, unspecified: Secondary | ICD-10-CM | POA: Diagnosis not present

## 2019-08-23 DIAGNOSIS — N83299 Other ovarian cyst, unspecified side: Secondary | ICD-10-CM | POA: Diagnosis not present

## 2019-08-23 DIAGNOSIS — Z01818 Encounter for other preprocedural examination: Secondary | ICD-10-CM

## 2019-08-23 DIAGNOSIS — N83292 Other ovarian cyst, left side: Secondary | ICD-10-CM | POA: Diagnosis not present

## 2019-08-23 MED ORDER — METRONIDAZOLE 500 MG PO TABS: 500.0000 mg | ORAL_TABLET | Freq: Two times a day (BID) | ORAL | 0 refills | Status: DC

## 2019-08-23 NOTE — H&P (Signed)
PRE-OPERATIVE HISTORY AND PHYSICAL EXAM  PCP:  [name], NP Subjective:   HPI:  [name] is a 43 y.o. G4P0010.  No LMP recorded. (Menstrual status: IUD).  She presents today for a pre-op discussion and PE.  She has the following symptoms: She has decided on definitive surgery.  She is specifically requesting a hysterectomy and oophorectomy.  Review of Systems:   Constitutional: Denied constitutional symptoms, night sweats, recent illness, fatigue, fever, insomnia and weight loss.  Eyes: Denied eye symptoms, eye pain, photophobia, vision change and visual disturbance.  Ears/Nose/Throat/Neck: Denied ear, nose, throat or neck symptoms, hearing loss, nasal discharge, sinus congestion and sore throat.  Cardiovascular: Denied cardiovascular symptoms, arrhythmia, chest pain/pressure, edema, exercise intolerance, orthopnea and palpitations.  Respiratory: Denied pulmonary symptoms, asthma, pleuritic pain, productive sputum, cough, dyspnea and wheezing.  Gastrointestinal: Denied, gastro-esophageal reflux, melena, nausea and vomiting.  Genitourinary: Denied genitourinary symptoms including symptomatic vaginal discharge, pelvic relaxation issues, and urinary complaints.  Musculoskeletal: Denied musculoskeletal symptoms, stiffness, swelling, muscle weakness and myalgia.  Dermatologic: Denied dermatology symptoms, rash and scar.  Neurologic: Denied neurology symptoms, dizziness, headache, neck pain and syncope.  Psychiatric: Denied psychiatric symptoms, anxiety and depression.  Endocrine: Denied endocrine symptoms including hot flashes and night sweats.   OB History  Gravida Para Term Preterm AB Living  4 3     1     SAB TAB Ectopic Multiple Live Births  1            # Outcome Date GA Lbr Len/2nd Weight Sex Delivery Anes PTL Lv  4 SAB           3 Para           2 Para           1 Para             Obstetric Comments  1st Menstrual Cycle:  18  1st Pregnancy:  18     Past Medical History:  Diagnosis Date  . Depression   . 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 Leukocytosis   . Tobacco use     Past Surgical History:  Procedure Laterality Date  . BREAST BIOPSY Right 2016   [place] core, fat necrosis  . COLONOSCOPY WITH PROPOFOL N/A 03/25/2018   Procedure: COLONOSCOPY WITH PROPOFOL;  Surgeon: Toledo, Benay Pike, MD;  Location: [facility];  Service: Gastroenterology;  Laterality: N/A;  . ESOPHAGOGASTRODUODENOSCOPY (EGD) WITH PROPOFOL N/A 03/25/2018   Procedure: ESOPHAGOGASTRODUODENOSCOPY (EGD) WITH PROPOFOL;  Surgeon: Toledo, Benay Pike, MD;  Location: [facility];  Service: Gastroenterology;  Laterality: N/A;  . INTRAUTERINE DEVICE INSERTION    . KNEE CARTILAGE SURGERY Right 02/2015  . LAPAROSCOPIC TUBAL LIGATION        SOCIAL HISTORY:  Social History   Tobacco Use  Smoking Status Current Every Day Smoker  . Packs/day: 1.00  . Years: 7.00  . Pack years: 7.00  . Types: Cigarettes  Smokeless Tobacco Never Used   Social History   Substance and Sexual Activity  Alcohol Use No  . Alcohol/week: 0.0 standard drinks    Social History   Substance and Sexual Activity  Drug Use No    Family History  Problem Relation Age of Onset  . Hypertension Mother   . Prostate cancer Father   . Hypertension Father   . Breast cancer Maternal Grandmother 55  . Colon cancer Neg Hx     ALLERGIES:  Patient has no known  allergies.  MEDS:   Current Outpatient Medications on File Prior to Visit  Medication Sig Dispense Refill  . amoxicillin-clavulanate (AUGMENTIN) 875-125 MG tablet Take 1 tablet by mouth every 12 (twelve) hours. 14 tablet 0  . fluticasone (FLONASE) 50 MCG/ACT nasal spray Place 2 sprays into both nostrils daily. 9.9 mL 0  . levonorgestrel (MIRENA) 20 MCG/24HR IUD 1 each by Intrauterine route once.    . methylPREDNISolone (MEDROL DOSEPAK) 4 MG TBPK tablet Take as directed 21 tablet 0   No current facility-administered  medications on file prior to visit.     No orders of the defined types were placed in this encounter.    Physical examination BP 132/84   Pulse 94   Ht 5\' 2"  (1.575 m)   Wt 171 lb 9.6 oz (77.8 kg)   BMI 31.39 kg/m   General NAD, Conversant  HEENT Atraumatic; Op clear with mmm.  Normo-cephalic. Pupils reactive. Anicteric sclerae  Thyroid/Neck Smooth without nodularity or enlargement. Normal ROM.  Neck Supple.  Skin No rashes, lesions or ulceration. Normal palpated skin turgor. No nodularity.  Breasts: No masses or discharge.  Symmetric.  No axillary adenopathy.  Lungs: Clear to auscultation.No rales or wheezes. Normal Respiratory effort, no retractions.  Heart: NSR.  No murmurs or rubs appreciated. No periferal edema  Abdomen: Soft.  Non-tender.  No masses.  No HSM. No hernia  Extremities: Moves all appropriately.  Normal ROM for age. No lymphadenopathy.  Neuro: Oriented to PPT.  Normal mood. Normal affect.     Pelvic:   Vulva: Normal appearance.  No lesions.  Vagina: No lesions or abnormalities noted.  Support: Normal pelvic support.  Urethra No masses tenderness or scarring.  Meatus Normal size without lesions or prolapse.  Cervix: Normal ectropion.  No lesions.  IUD in place-strings noted  Anus: Normal exam.  No lesions.  Perineum: Normal exam.  No lesions.        Bimanual   Uterus:  Enlarged non-tender.  Mobile.  AV.  Adnexae: No masses.  Non-tender to palpation.  Cul-de-sac: Negative for abnormality.   Assessment:   G4P0010 Patient Active Problem List   Diagnosis Date Noted  . GI bleed 03/23/2018  . Leucocytosis 03/04/2016    1. Preop examination   2. Fibroids   3. Complex ovarian cyst   4. Pelvic pain in female    Patient specifically requesting definitive surgery-hysterectomy.  Plan:   Orders: No orders of the defined types were placed in this encounter.    1.  LAVH  LSO

## 2019-08-23 NOTE — H&P (View-Only) (Signed)
PRE-OPERATIVE HISTORY AND PHYSICAL EXAM  PCP:  [name], NP Subjective:   HPI:  [name] is a 43 y.o. G4P0010.  No LMP recorded. (Menstrual status: IUD).  She presents today for a pre-op discussion and PE.  She has the following symptoms: She has decided on definitive surgery.  She is specifically requesting a hysterectomy and oophorectomy.  Review of Systems:   Constitutional: Denied constitutional symptoms, night sweats, recent illness, fatigue, fever, insomnia and weight loss.  Eyes: Denied eye symptoms, eye pain, photophobia, vision change and visual disturbance.  Ears/Nose/Throat/Neck: Denied ear, nose, throat or neck symptoms, hearing loss, nasal discharge, sinus congestion and sore throat.  Cardiovascular: Denied cardiovascular symptoms, arrhythmia, chest pain/pressure, edema, exercise intolerance, orthopnea and palpitations.  Respiratory: Denied pulmonary symptoms, asthma, pleuritic pain, productive sputum, cough, dyspnea and wheezing.  Gastrointestinal: Denied, gastro-esophageal reflux, melena, nausea and vomiting.  Genitourinary: Denied genitourinary symptoms including symptomatic vaginal discharge, pelvic relaxation issues, and urinary complaints.  Musculoskeletal: Denied musculoskeletal symptoms, stiffness, swelling, muscle weakness and myalgia.  Dermatologic: Denied dermatology symptoms, rash and scar.  Neurologic: Denied neurology symptoms, dizziness, headache, neck pain and syncope.  Psychiatric: Denied psychiatric symptoms, anxiety and depression.  Endocrine: Denied endocrine symptoms including hot flashes and night sweats.   OB History  Gravida Para Term Preterm AB Living  4 3     1     SAB TAB Ectopic Multiple Live Births  1            # Outcome Date GA Lbr Len/2nd Weight Sex Delivery Anes PTL Lv  4 SAB           3 Para           2 Para           1 Para             Obstetric Comments  1st Menstrual Cycle:  18  1st Pregnancy:  18     Past Medical History:  Diagnosis Date  . Depression   . 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 Leukocytosis   . Tobacco use     Past Surgical History:  Procedure Laterality Date  . BREAST BIOPSY Right 2016   [place] core, fat necrosis  . COLONOSCOPY WITH PROPOFOL N/A 03/25/2018   Procedure: COLONOSCOPY WITH PROPOFOL;  Surgeon: Toledo, Benay Pike, MD;  Location: [facility];  Service: Gastroenterology;  Laterality: N/A;  . ESOPHAGOGASTRODUODENOSCOPY (EGD) WITH PROPOFOL N/A 03/25/2018   Procedure: ESOPHAGOGASTRODUODENOSCOPY (EGD) WITH PROPOFOL;  Surgeon: Toledo, Benay Pike, MD;  Location: [facility];  Service: Gastroenterology;  Laterality: N/A;  . INTRAUTERINE DEVICE INSERTION    . KNEE CARTILAGE SURGERY Right 02/2015  . LAPAROSCOPIC TUBAL LIGATION        SOCIAL HISTORY:  Social History   Tobacco Use  Smoking Status Current Every Day Smoker  . Packs/day: 1.00  . Years: 7.00  . Pack years: 7.00  . Types: Cigarettes  Smokeless Tobacco Never Used   Social History   Substance and Sexual Activity  Alcohol Use No  . Alcohol/week: 0.0 standard drinks    Social History   Substance and Sexual Activity  Drug Use No    Family History  Problem Relation Age of Onset  . Hypertension Mother   . Prostate cancer Father   . Hypertension Father   . Breast cancer Maternal Grandmother 37  . Colon cancer Neg Hx     ALLERGIES:  Patient has no known  allergies.  MEDS:   Current Outpatient Medications on File Prior to Visit  Medication Sig Dispense Refill  . amoxicillin-clavulanate (AUGMENTIN) 875-125 MG tablet Take 1 tablet by mouth every 12 (twelve) hours. 14 tablet 0  . fluticasone (FLONASE) 50 MCG/ACT nasal spray Place 2 sprays into both nostrils daily. 9.9 mL 0  . levonorgestrel (MIRENA) 20 MCG/24HR IUD 1 each by Intrauterine route once.    . methylPREDNISolone (MEDROL DOSEPAK) 4 MG TBPK tablet Take as directed 21 tablet 0   No current facility-administered  medications on file prior to visit.     No orders of the defined types were placed in this encounter.    Physical examination BP 132/84   Pulse 94   Ht 5\' 2"  (1.575 m)   Wt 171 lb 9.6 oz (77.8 kg)   BMI 31.39 kg/m   General NAD, Conversant  HEENT Atraumatic; Op clear with mmm.  Normo-cephalic. Pupils reactive. Anicteric sclerae  Thyroid/Neck Smooth without nodularity or enlargement. Normal ROM.  Neck Supple.  Skin No rashes, lesions or ulceration. Normal palpated skin turgor. No nodularity.  Breasts: No masses or discharge.  Symmetric.  No axillary adenopathy.  Lungs: Clear to auscultation.No rales or wheezes. Normal Respiratory effort, no retractions.  Heart: NSR.  No murmurs or rubs appreciated. No periferal edema  Abdomen: Soft.  Non-tender.  No masses.  No HSM. No hernia  Extremities: Moves all appropriately.  Normal ROM for age. No lymphadenopathy.  Neuro: Oriented to PPT.  Normal mood. Normal affect.     Pelvic:   Vulva: Normal appearance.  No lesions.  Vagina: No lesions or abnormalities noted.  Support: Normal pelvic support.  Urethra No masses tenderness or scarring.  Meatus Normal size without lesions or prolapse.  Cervix: Normal ectropion.  No lesions.  IUD in place-strings noted  Anus: Normal exam.  No lesions.  Perineum: Normal exam.  No lesions.        Bimanual   Uterus:  Enlarged non-tender.  Mobile.  AV.  Adnexae: No masses.  Non-tender to palpation.  Cul-de-sac: Negative for abnormality.   Assessment:   G4P0010 Patient Active Problem List   Diagnosis Date Noted  . GI bleed 03/23/2018  . Leucocytosis 03/04/2016    1. Preop examination   2. Fibroids   3. Complex ovarian cyst   4. Pelvic pain in female    Patient specifically requesting definitive surgery-hysterectomy.  Plan:   Orders: No orders of the defined types were placed in this encounter.    1.  LAVH  LSO

## 2019-08-23 NOTE — Progress Notes (Signed)
PRE-OPERATIVE HISTORY AND PHYSICAL EXAM  PCP:  [name], NP Subjective:   HPI:  [name] is a 43 y.o. G4P0010.  No LMP recorded. (Menstrual status: IUD).  She presents today for a pre-op discussion and PE.  She has the following symptoms: She has decided on definitive surgery.  She is specifically requesting a hysterectomy and oophorectomy.  Review of Systems:   Constitutional: Denied constitutional symptoms, night sweats, recent illness, fatigue, fever, insomnia and weight loss.  Eyes: Denied eye symptoms, eye pain, photophobia, vision change and visual disturbance.  Ears/Nose/Throat/Neck: Denied ear, nose, throat or neck symptoms, hearing loss, nasal discharge, sinus congestion and sore throat.  Cardiovascular: Denied cardiovascular symptoms, arrhythmia, chest pain/pressure, edema, exercise intolerance, orthopnea and palpitations.  Respiratory: Denied pulmonary symptoms, asthma, pleuritic pain, productive sputum, cough, dyspnea and wheezing.  Gastrointestinal: Denied, gastro-esophageal reflux, melena, nausea and vomiting.  Genitourinary: Denied genitourinary symptoms including symptomatic vaginal discharge, pelvic relaxation issues, and urinary complaints.  Musculoskeletal: Denied musculoskeletal symptoms, stiffness, swelling, muscle weakness and myalgia.  Dermatologic: Denied dermatology symptoms, rash and scar.  Neurologic: Denied neurology symptoms, dizziness, headache, neck pain and syncope.  Psychiatric: Denied psychiatric symptoms, anxiety and depression.  Endocrine: Denied endocrine symptoms including hot flashes and night sweats.   OB History  Gravida Para Term Preterm AB Living  4 3     1     SAB TAB Ectopic Multiple Live Births  1            # Outcome Date GA Lbr Len/2nd Weight Sex Delivery Anes PTL Lv  4 SAB           3 Para           2 Para           1 Para             Obstetric Comments  1st Menstrual Cycle:  18  1st Pregnancy:  18     Past Medical History:  Diagnosis Date  . Depression   . 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 Leukocytosis   . Tobacco use     Past Surgical History:  Procedure Laterality Date  . BREAST BIOPSY Right 2016   [place] core, fat necrosis  . COLONOSCOPY WITH PROPOFOL N/A 03/25/2018   Procedure: COLONOSCOPY WITH PROPOFOL;  Surgeon: Toledo, Benay Pike, MD;  Location: [facility];  Service: Gastroenterology;  Laterality: N/A;  . ESOPHAGOGASTRODUODENOSCOPY (EGD) WITH PROPOFOL N/A 03/25/2018   Procedure: ESOPHAGOGASTRODUODENOSCOPY (EGD) WITH PROPOFOL;  Surgeon: Toledo, Benay Pike, MD;  Location: [facility];  Service: Gastroenterology;  Laterality: N/A;  . INTRAUTERINE DEVICE INSERTION    . KNEE CARTILAGE SURGERY Right 02/2015  . LAPAROSCOPIC TUBAL LIGATION        SOCIAL HISTORY:  Social History   Tobacco Use  Smoking Status Current Every Day Smoker  . Packs/day: 1.00  . Years: 7.00  . Pack years: 7.00  . Types: Cigarettes  Smokeless Tobacco Never Used   Social History   Substance and Sexual Activity  Alcohol Use No  . Alcohol/week: 0.0 standard drinks    Social History   Substance and Sexual Activity  Drug Use No    Family History  Problem Relation Age of Onset  . Hypertension Mother   . Prostate cancer Father   . Hypertension Father   . Breast cancer Maternal Grandmother 77  . Colon cancer Neg Hx     ALLERGIES:  Patient has no known  allergies.  MEDS:   Current Outpatient Medications on File Prior to Visit  Medication Sig Dispense Refill  . amoxicillin-clavulanate (AUGMENTIN) 875-125 MG tablet Take 1 tablet by mouth every 12 (twelve) hours. 14 tablet 0  . fluticasone (FLONASE) 50 MCG/ACT nasal spray Place 2 sprays into both nostrils daily. 9.9 mL 0  . levonorgestrel (MIRENA) 20 MCG/24HR IUD 1 each by Intrauterine route once.    . methylPREDNISolone (MEDROL DOSEPAK) 4 MG TBPK tablet Take as directed 21 tablet 0   No current facility-administered  medications on file prior to visit.     No orders of the defined types were placed in this encounter.    Physical examination BP 132/84   Pulse 94   Ht 5\' 2"  (1.575 m)   Wt 171 lb 9.6 oz (77.8 kg)   BMI 31.39 kg/m   General NAD, Conversant  HEENT Atraumatic; Op clear with mmm.  Normo-cephalic. Pupils reactive. Anicteric sclerae  Thyroid/Neck Smooth without nodularity or enlargement. Normal ROM.  Neck Supple.  Skin No rashes, lesions or ulceration. Normal palpated skin turgor. No nodularity.  Breasts: No masses or discharge.  Symmetric.  No axillary adenopathy.  Lungs: Clear to auscultation.No rales or wheezes. Normal Respiratory effort, no retractions.  Heart: NSR.  No murmurs or rubs appreciated. No periferal edema  Abdomen: Soft.  Non-tender.  No masses.  No HSM. No hernia  Extremities: Moves all appropriately.  Normal ROM for age. No lymphadenopathy.  Neuro: Oriented to PPT.  Normal mood. Normal affect.     Pelvic:   Vulva: Normal appearance.  No lesions.  Vagina: No lesions or abnormalities noted.  Support: Normal pelvic support.  Urethra No masses tenderness or scarring.  Meatus Normal size without lesions or prolapse.  Cervix: Normal ectropion.  No lesions.  IUD in place-strings noted  Anus: Normal exam.  No lesions.  Perineum: Normal exam.  No lesions.        Bimanual   Uterus:  Enlarged non-tender.  Mobile.  AV.  Adnexae: No masses.  Non-tender to palpation.  Cul-de-sac: Negative for abnormality.   Assessment:   G4P0010 Patient Active Problem List   Diagnosis Date Noted  . GI bleed 03/23/2018  . Leucocytosis 03/04/2016    1. Preop examination   2. Fibroids   3. Complex ovarian cyst   4. Pelvic pain in female    Patient specifically requesting definitive surgery-hysterectomy.  Plan:   Orders: No orders of the defined types were placed in this encounter.    1.  LAVH  LSO  Pre-op discussions regarding Risks and Benefits of her scheduled surgery.   LAVH The procedure of Laparoscopic Assisted Vaginal Hysterectomy was described to the patient in detail.  We reviewed the rationale for Hysterectomy and the patient was again informed of other nonsurgical management possibilities for her condition.  She has considered these other options, and desires a Hysterectomy.  We have reviewed the fact that Hysterectomy is permanent and that following the procedure she will not be able to become pregnant or bear children.  We have discussed the following risk factors specifically and the patient has also been informed that additional complications not mentioned may develop:  Damage to bowel, bladder, ureters or to other internal organs, bleeding, infection and the risk from anesthesia.  We have discussed the procedure itself in detail and she has an informed understanding of this surgery.  We have also discussed the recovery period in which physical and sexual activity will be restricted for a varying  degree of time, often 3 - 6 weeks. The Laparoscopic Portion of Hysterectomy has also been reviewed with the patient.  She understands how the laparoscope facilitates the procedure.  We have discussed the abdominal incisions and punctures that will be used.  We have also reviewed the increased Operating Room time often accompanying LAVH.  The slightly increased risk of complications secondary to abdominal punctures, and use of laparoscopic instrumentation has also been discussed in detail.I have answered all of her questions and I believe the patient has an informed understanding of the procedure of Laparoscopic Assisted Vaginal Hysterectomy.  Oophorectomy The option of Oophorectomy has been discussed with the patient.  Detailed risk/benefits have been reviewed.  The risks discussed include, but are not limited to, hemorrhage, infection, damage to ureter or other internal organ, and Ovarian Remnant Syndrome.  The benefits include a significant decrease in the risk of  Ovarian Cancer and in benign Ovarian disease.  The risk of Ovarian CA has been estimated at 1 in 8.  This is a relatively small risk.  However, should Ovarian CA develop, it is often found late in the course of the disease.  We have also discussed the role of inheritance in the development of Ovarian disease.  Some women, who have close relatives with Ovarian CA, have a higher than 1 in 70 risk of Ovarian CA.  The benefits of Estrogen replacement therapy following Oophorectomy has been stressed.  If she is premenopausal, we have discussed the fact that this procedure will make her permanently sterile and that premature menopause will result if no ERT is begun.  I have answered all of her questions, and I believe that she has an adequate and informed understanding of the risks and benefits of Oophorectomy. Patient would like her left ovary removed because of the cyst.  She has decided to keep her right ovary.  I spent 33 minutes involved in the care of this patient of which greater than 50% was spent discussing risk benefits of hysterectomy, effect on bleeding, nephrectomy, increased risks of surgery including uterine fibroids and prior abdominal surgery.  All questions answered.  Finis Bud, M.D. 08/23/2019 10:42 AM

## 2019-08-23 NOTE — Progress Notes (Signed)
Patient comes in today for pre op appointment.  

## 2019-08-24 ENCOUNTER — Other Ambulatory Visit: Payer: Self-pay

## 2019-08-24 ENCOUNTER — Encounter
Admission: RE | Admit: 2019-08-24 | Discharge: 2019-08-24 | Disposition: A | Discharge status: Home / Self Care | Payer: Medicaid Other | Source: Ambulatory Visit | Attending: Obstetrics and Gynecology | Admitting: Obstetrics and Gynecology

## 2019-08-24 NOTE — Patient Instructions (Addendum)
Your COVID swab is scheduled on: Thursday 08/25/2019.  Drive up any time [id number] am in front of [facility].  Your procedure is scheduled on: Monday 08/29/2019 Report to Same Day Surgery 2nd floor [facility] Entrance-take elevator on left to 2nd floor.  Check in with surgery information desk.) To find out your arrival time, call [phone] 1:00-3:00 PM on Friday 08/26/2019  Remember: Instructions that are not followed completely may result in serious medical risk, up to and including death, or upon the discretion of your surgeon and anesthesiologist your surgery may need to be rescheduled.    __x__ 1. Do not eat food (including mints, candies, chewing gum) after midnight the night before your procedure. You may drink clear liquids up to 2 hours before you are scheduled to arrive at the hospital for your procedure.  Do not drink anything within 2 hours of your scheduled arrival to the hospital.  Approved clear liquids:  --Water or Apple juice without pulp  --Clear carbohydrate beverage such as Gatorade or Powerade  --Black Coffee or Clear Tea (No milk, no creamers, do not add anything to the coffee or tea)  Finish your provided Ensure clear carbohydrate beverage 2 hours before you are scheduled to arrive at the hospital.    __x__ 2. No Alcohol for 24 hours before or after surgery.   __x__ 3. No Smoking or e-cigarettes for 24 hours before surgery.  Do not use any chewable tobacco products for at least 6 hours before surgery.   __x__ 4. Notify your doctor if there is any change in your medical condition (cold, fever, infections).   __x__ 5. On the morning of surgery brush your teeth with toothpaste and water.  You may rinse your mouth with mouthwash if you wish.  Do not swallow any toothpaste or mouthwash.  Please read over the following fact sheets that you were given:   [facility] Preparing for Surgery and/or MRSA Information    __x__ Use CHG Soap as directed  on instruction sheet.   Do not wear jewelry, make-up, hairpins, clips or nail polish on the day of surgery.  Do not wear lotions, powders, deodorant, or perfumes.   Do not shave below the face/neck 48 hours prior to surgery.   Do not bring valuables to the hospital.    [facility] is not responsible for any belongings or valuables.    For patients admitted to the hospital, discharge time is determined by your treatment team.  For patients discharged on the day of surgery, you will NOT be permitted to drive yourself home.  You must have a responsible adult with you for 24 hours after surgery.  __x__ Take these medicines on the morning of surgery with a SMALL SIP OF WATER:  1. Gabapentin (Neurontin) if needed for pain  ____ Follow recommendations from Cardiologist, Pulmonologist or PCP regarding stopping any blood thinners such as Aspirin, Coumadin, Plavix, Eliquis, Effient, Pradaxa, and Pletal.  __x__ STARTING TODAY: Do not take any Anti-inflammatories such as Advil, Ibuprofen, Motrin, Aleve, Naproxen, Naprosyn, BC/Goodies powders or aspirin products. You may take Tylenol if needed.   __x__ STARTING TODAY: Do not take any over the counter supplements until after surgery.  Reviewed instructions with patient via telephone interview.  Questions answered and patient expressed understanding. 08/24/2019 5:15 pm

## 2019-08-25 ENCOUNTER — Other Ambulatory Visit
Admission: RE | Admit: 2019-08-25 | Discharge: 2019-08-25 | Disposition: A | Discharge status: Home / Self Care | Payer: Medicaid Other | Source: Ambulatory Visit | Attending: Obstetrics and Gynecology | Admitting: Obstetrics and Gynecology

## 2019-08-25 DIAGNOSIS — Z01812 Encounter for preprocedural laboratory examination: Secondary | ICD-10-CM | POA: Insufficient documentation

## 2019-08-25 DIAGNOSIS — Z20828 Contact with and (suspected) exposure to other viral communicable diseases: Secondary | ICD-10-CM | POA: Insufficient documentation

## 2019-08-25 LAB — CBC
HCT: 47.9 % — ABNORMAL HIGH (ref 36.0–46.0)
Hemoglobin: 16.1 g/dL — ABNORMAL HIGH (ref 12.0–15.0)
MCH: 30.7 pg (ref 26.0–34.0)
MCHC: 33.6 g/dL (ref 30.0–36.0)
MCV: 91.2 fL (ref 80.0–100.0)
Platelets: 534 10*3/uL — ABNORMAL HIGH (ref 150–400)
RBC: 5.25 MIL/uL — ABNORMAL HIGH (ref 3.87–5.11)
RDW: 13.6 % (ref 11.5–15.5)
WBC: 19.7 10*3/uL — ABNORMAL HIGH (ref 4.0–10.5)
nRBC: 0 % (ref 0.0–0.2)

## 2019-08-25 LAB — TYPE AND SCREEN
ABO/RH(D): A POS
Antibody Screen: NEGATIVE

## 2019-08-25 LAB — SARS CORONAVIRUS 2 (TAT 6-24 HRS): SARS Coronavirus 2: NEGATIVE

## 2019-08-25 MED ORDER — LACTATED RINGERS IV SOLN
INTRAVENOUS | Status: DC
  Filled 2019-08-25: qty 1000

## 2019-08-29 ENCOUNTER — Observation Stay
Admission: RE | Admit: 2019-08-29 | Discharge: 2019-08-30 | Disposition: A | Discharge status: Home / Self Care | Payer: Medicaid Other | Source: Ambulatory Visit | Attending: Obstetrics and Gynecology | Admitting: Obstetrics and Gynecology

## 2019-08-29 ENCOUNTER — Encounter
Admission: RE | Disposition: A | Discharge status: Home / Self Care | Payer: Self-pay | Source: Ambulatory Visit | Attending: Obstetrics and Gynecology

## 2019-08-29 ENCOUNTER — Inpatient Hospital Stay: Payer: Medicaid Other | Admitting: Anesthesiology

## 2019-08-29 ENCOUNTER — Other Ambulatory Visit: Payer: Self-pay

## 2019-08-29 DIAGNOSIS — R102 Pelvic and perineal pain: Secondary | ICD-10-CM | POA: Insufficient documentation

## 2019-08-29 DIAGNOSIS — Z79899 Other long term (current) drug therapy: Secondary | ICD-10-CM | POA: Diagnosis not present

## 2019-08-29 DIAGNOSIS — D252 Subserosal leiomyoma of uterus: Secondary | ICD-10-CM

## 2019-08-29 DIAGNOSIS — N938 Other specified abnormal uterine and vaginal bleeding: Secondary | ICD-10-CM | POA: Diagnosis not present

## 2019-08-29 DIAGNOSIS — N8311 Corpus luteum cyst of right ovary: Secondary | ICD-10-CM | POA: Insufficient documentation

## 2019-08-29 DIAGNOSIS — N8312 Corpus luteum cyst of left ovary: Secondary | ICD-10-CM | POA: Insufficient documentation

## 2019-08-29 DIAGNOSIS — D251 Intramural leiomyoma of uterus: Secondary | ICD-10-CM | POA: Diagnosis not present

## 2019-08-29 DIAGNOSIS — D250 Submucous leiomyoma of uterus: Secondary | ICD-10-CM

## 2019-08-29 DIAGNOSIS — Z793 Long term (current) use of hormonal contraceptives: Secondary | ICD-10-CM | POA: Insufficient documentation

## 2019-08-29 DIAGNOSIS — Z30432 Encounter for removal of intrauterine contraceptive device: Secondary | ICD-10-CM

## 2019-08-29 DIAGNOSIS — N800 Endometriosis of uterus: Secondary | ICD-10-CM

## 2019-08-29 DIAGNOSIS — F17210 Nicotine dependence, cigarettes, uncomplicated: Secondary | ICD-10-CM | POA: Diagnosis not present

## 2019-08-29 DIAGNOSIS — Z9889 Other specified postprocedural states: Secondary | ICD-10-CM

## 2019-08-29 HISTORY — PX: IUD REMOVAL: SHX5392

## 2019-08-29 HISTORY — PX: LAPAROSCOPIC ASSISTED VAGINAL HYSTERECTOMY: SHX5398

## 2019-08-29 HISTORY — PX: LAPAROSCOPIC BILATERAL SALPINGO OOPHERECTOMY: SHX5890

## 2019-08-29 LAB — POCT PREGNANCY, URINE: Preg Test, Ur: NEGATIVE

## 2019-08-29 SURGERY — HYSTERECTOMY, VAGINAL, LAPAROSCOPY-ASSISTED
Anesthesia: General

## 2019-08-29 MED ORDER — LIDOCAINE HCL (PF) 2 % IJ SOLN
INTRAMUSCULAR | Status: AC
  Filled 2019-08-29: qty 10

## 2019-08-29 MED ORDER — OXYCODONE HCL 5 MG/5ML PO SOLN: 5.0000 mg | Freq: Once | ORAL | Status: DC | PRN

## 2019-08-29 MED ORDER — IBUPROFEN 800 MG PO TABS: 800.0000 mg | ORAL_TABLET | Freq: Three times a day (TID) | ORAL | Status: DC

## 2019-08-29 MED ORDER — DEXTROSE IN LACTATED RINGERS 5 % IV SOLN
INTRAVENOUS | Status: DC
  Administered 2019-08-29: 14:00:00 via INTRAVENOUS

## 2019-08-29 MED ORDER — DEXAMETHASONE SODIUM PHOSPHATE 10 MG/ML IJ SOLN
INTRAMUSCULAR | Status: AC
  Filled 2019-08-29: qty 1

## 2019-08-29 MED ORDER — OXYCODONE HCL 5 MG PO TABS: 5.0000 mg | ORAL_TABLET | Freq: Once | ORAL | Status: DC | PRN

## 2019-08-29 MED ORDER — FENTANYL CITRATE (PF) 100 MCG/2ML IJ SOLN
INTRAMUSCULAR | Status: AC
  Filled 2019-08-29: qty 2

## 2019-08-29 MED ORDER — FAMOTIDINE 20 MG PO TABS
20.0000 mg | ORAL_TABLET | Freq: Once | ORAL | Status: AC
  Administered 2019-08-29: 20 mg via ORAL

## 2019-08-29 MED ORDER — KETOROLAC TROMETHAMINE 30 MG/ML IJ SOLN
30.0000 mg | Freq: Once | INTRAMUSCULAR | Status: AC
  Administered 2019-08-29: 30 mg via INTRAVENOUS
  Filled 2019-08-29: qty 1

## 2019-08-29 MED ORDER — ACETAMINOPHEN 10 MG/ML IV SOLN
INTRAVENOUS | Status: AC
  Filled 2019-08-29: qty 100

## 2019-08-29 MED ORDER — IPRATROPIUM-ALBUTEROL 0.5-2.5 (3) MG/3ML IN SOLN: 3.0000 mL | Freq: Once | RESPIRATORY_TRACT | Status: DC | PRN

## 2019-08-29 MED ORDER — SEVOFLURANE IN SOLN
RESPIRATORY_TRACT | Status: AC
  Filled 2019-08-29: qty 250

## 2019-08-29 MED ORDER — SIMETHICONE 80 MG PO CHEW
80.0000 mg | CHEWABLE_TABLET | Freq: Four times a day (QID) | ORAL | Status: DC | PRN
  Administered 2019-08-29: 80 mg via ORAL
  Filled 2019-08-29: qty 1

## 2019-08-29 MED ORDER — VASOPRESSIN 20 UNIT/ML IV SOLN
INTRAVENOUS | Status: DC | PRN
  Administered 2019-08-29: 10 mL via INTRAMUSCULAR

## 2019-08-29 MED ORDER — ONDANSETRON HCL 4 MG/2ML IJ SOLN
4.0000 mg | Freq: Four times a day (QID) | INTRAMUSCULAR | Status: DC | PRN
  Administered 2019-08-29 – 2019-08-30 (×3): 4 mg via INTRAVENOUS
  Filled 2019-08-29 (×3): qty 2

## 2019-08-29 MED ORDER — FENTANYL CITRATE (PF) 100 MCG/2ML IJ SOLN
25.0000 ug | INTRAMUSCULAR | Status: DC | PRN
  Administered 2019-08-29 (×2): 50 ug via INTRAVENOUS

## 2019-08-29 MED ORDER — BUPIVACAINE HCL 0.5 % IJ SOLN
INTRAMUSCULAR | Status: DC | PRN
  Administered 2019-08-29: 10 mL

## 2019-08-29 MED ORDER — SUGAMMADEX SODIUM 500 MG/5ML IV SOLN
INTRAVENOUS | Status: DC | PRN
  Administered 2019-08-29: 400 mg via INTRAVENOUS

## 2019-08-29 MED ORDER — IBUPROFEN 800 MG PO TABS
800.0000 mg | ORAL_TABLET | Freq: Three times a day (TID) | ORAL | Status: DC
  Administered 2019-08-29 – 2019-08-30 (×2): 800 mg via ORAL
  Filled 2019-08-29 (×2): qty 1

## 2019-08-29 MED ORDER — ESTRADIOL 1 MG PO TABS
1.0000 mg | ORAL_TABLET | Freq: Every day | ORAL | Status: DC
  Administered 2019-08-29: 1 mg via ORAL
  Filled 2019-08-29 (×2): qty 1

## 2019-08-29 MED ORDER — FENTANYL CITRATE (PF) 100 MCG/2ML IJ SOLN
INTRAMUSCULAR | Status: DC | PRN
  Administered 2019-08-29: 100 ug via INTRAVENOUS

## 2019-08-29 MED ORDER — DEXAMETHASONE SODIUM PHOSPHATE 10 MG/ML IJ SOLN
INTRAMUSCULAR | Status: DC | PRN
  Administered 2019-08-29: 10 mg via INTRAVENOUS

## 2019-08-29 MED ORDER — ROCURONIUM BROMIDE 50 MG/5ML IV SOLN
INTRAVENOUS | Status: AC
  Filled 2019-08-29: qty 1

## 2019-08-29 MED ORDER — ONDANSETRON HCL 4 MG/2ML IJ SOLN
INTRAMUSCULAR | Status: AC
  Filled 2019-08-29: qty 2

## 2019-08-29 MED ORDER — MIDAZOLAM HCL 2 MG/2ML IJ SOLN
INTRAMUSCULAR | Status: DC | PRN
  Administered 2019-08-29: 2 mg via INTRAVENOUS

## 2019-08-29 MED ORDER — LIDOCAINE HCL (CARDIAC) PF 100 MG/5ML IV SOSY
PREFILLED_SYRINGE | INTRAVENOUS | Status: DC | PRN
  Administered 2019-08-29: 100 mg via INTRAVENOUS

## 2019-08-29 MED ORDER — SODIUM CHLORIDE (PF) 0.9 % IJ SOLN
INTRAMUSCULAR | Status: AC
  Filled 2019-08-29: qty 50

## 2019-08-29 MED ORDER — VASOPRESSIN 20 UNIT/ML IV SOLN
INTRAVENOUS | Status: AC
  Filled 2019-08-29: qty 1

## 2019-08-29 MED ORDER — LACTATED RINGERS IV SOLN
INTRAVENOUS | Status: DC | PRN
  Administered 2019-08-29: 10:00:00 via INTRAVENOUS

## 2019-08-29 MED ORDER — LACTATED RINGERS IV SOLN
INTRAVENOUS | Status: DC
  Administered 2019-08-29: 50 mL/h via INTRAVENOUS

## 2019-08-29 MED ORDER — PROPOFOL 500 MG/50ML IV EMUL
INTRAVENOUS | Status: AC
  Filled 2019-08-29: qty 50

## 2019-08-29 MED ORDER — ACETAMINOPHEN 10 MG/ML IV SOLN
INTRAVENOUS | Status: DC | PRN
  Administered 2019-08-29: 1000 mg via INTRAVENOUS

## 2019-08-29 MED ORDER — FENTANYL CITRATE (PF) 100 MCG/2ML IJ SOLN
INTRAMUSCULAR | Status: AC
  Administered 2019-08-29: 50 ug via INTRAVENOUS
  Filled 2019-08-29: qty 2

## 2019-08-29 MED ORDER — ONDANSETRON HCL 4 MG/2ML IJ SOLN
INTRAMUSCULAR | Status: DC | PRN
  Administered 2019-08-29: 4 mg via INTRAVENOUS

## 2019-08-29 MED ORDER — CEFAZOLIN SODIUM-DEXTROSE 2-4 GM/100ML-% IV SOLN
INTRAVENOUS | Status: AC
  Filled 2019-08-29: qty 100

## 2019-08-29 MED ORDER — PROPOFOL 10 MG/ML IV BOLUS
INTRAVENOUS | Status: DC | PRN
  Administered 2019-08-29: 150 mg via INTRAVENOUS

## 2019-08-29 MED ORDER — CEFAZOLIN SODIUM-DEXTROSE 2-4 GM/100ML-% IV SOLN
2.0000 g | INTRAVENOUS | Status: AC
  Administered 2019-08-29: 2 g via INTRAVENOUS

## 2019-08-29 MED ORDER — DEXMEDETOMIDINE HCL 200 MCG/2ML IV SOLN
INTRAVENOUS | Status: DC | PRN
  Administered 2019-08-29 (×2): 16 ug via INTRAVENOUS

## 2019-08-29 MED ORDER — ROCURONIUM BROMIDE 100 MG/10ML IV SOLN
INTRAVENOUS | Status: DC | PRN
  Administered 2019-08-29: 50 mg via INTRAVENOUS
  Administered 2019-08-29: 10 mg via INTRAVENOUS

## 2019-08-29 MED ORDER — MIDAZOLAM HCL 2 MG/2ML IJ SOLN
INTRAMUSCULAR | Status: AC
  Filled 2019-08-29: qty 2

## 2019-08-29 MED ORDER — OXYCODONE-ACETAMINOPHEN 5-325 MG PO TABS
1.0000 | ORAL_TABLET | ORAL | Status: DC | PRN
  Administered 2019-08-29 – 2019-08-30 (×5): 1 via ORAL
  Administered 2019-08-30: 2 via ORAL
  Filled 2019-08-29: qty 1
  Filled 2019-08-29: qty 2
  Filled 2019-08-29: qty 1
  Filled 2019-08-29 (×2): qty 2
  Filled 2019-08-29 (×2): qty 1

## 2019-08-29 MED ORDER — BUPIVACAINE HCL (PF) 0.5 % IJ SOLN
INTRAMUSCULAR | Status: AC
  Filled 2019-08-29: qty 30

## 2019-08-29 MED ORDER — FAMOTIDINE 20 MG PO TABS
ORAL_TABLET | ORAL | Status: AC
  Filled 2019-08-29: qty 1

## 2019-08-29 SURGICAL SUPPLY — 60 items
ADHESIVE MASTISOL STRL (MISCELLANEOUS) ×5 IMPLANT
BAG DECANTER FOR FLEXI CONT (MISCELLANEOUS) ×5 IMPLANT
BAG URINE DRAIN 2000ML AR STRL (UROLOGICAL SUPPLIES) ×5 IMPLANT
BLADE SURG 15 STRL LF DISP TIS (BLADE) ×4 IMPLANT
BLADE SURG 15 STRL SS (BLADE) ×1
BLADE SURG SZ11 CARB STEEL (BLADE) ×5 IMPLANT
CANISTER SUCT 1200ML W/VALVE (MISCELLANEOUS) ×5 IMPLANT
CATH FOLEY 2WAY  5CC 16FR (CATHETERS) ×1
CATH ROBINSON RED A/P 16FR (CATHETERS) ×5 IMPLANT
CATH URTH 16FR FL 2W BLN LF (CATHETERS) ×4 IMPLANT
CHLORAPREP W/TINT 26 (MISCELLANEOUS) ×5 IMPLANT
COVER WAND RF STERILE (DRAPES) IMPLANT
DEFOGGER SCOPE WARMER CLEARIFY (MISCELLANEOUS) ×5 IMPLANT
DERMABOND ADVANCED (GAUZE/BANDAGES/DRESSINGS) ×1
DERMABOND ADVANCED .7 DNX12 (GAUZE/BANDAGES/DRESSINGS) ×4 IMPLANT
ELECT REM PT RETURN 9FT ADLT (ELECTROSURGICAL) ×5
ELECTRODE REM PT RTRN 9FT ADLT (ELECTROSURGICAL) ×4 IMPLANT
GLOVE BIOGEL PI ORTHO PRO 7.5 (GLOVE) ×1
GLOVE PI ORTHO PRO STRL 7.5 (GLOVE) ×4 IMPLANT
GLOVE SURG SYN 8.0 (GLOVE) ×10 IMPLANT
GOWN STRL REUS W/ TWL LRG LVL3 (GOWN DISPOSABLE) ×8 IMPLANT
GOWN STRL REUS W/ TWL XL LVL3 (GOWN DISPOSABLE) ×8 IMPLANT
GOWN STRL REUS W/TWL LRG LVL3 (GOWN DISPOSABLE) ×2
GOWN STRL REUS W/TWL XL LVL3 (GOWN DISPOSABLE) ×2
GOWN STRL REUS W/TWL XL LVL4 (GOWN DISPOSABLE) ×5 IMPLANT
HANDLE YANKAUER SUCT BULB TIP (MISCELLANEOUS) ×5 IMPLANT
IRRIGATION STRYKERFLOW (MISCELLANEOUS) IMPLANT
IRRIGATOR STRYKERFLOW (MISCELLANEOUS)
IV LACTATED RINGERS 1000ML (IV SOLUTION) ×5 IMPLANT
KIT PINK PAD W/HEAD ARE REST (MISCELLANEOUS) ×5
KIT PINK PAD W/HEAD ARM REST (MISCELLANEOUS) ×4 IMPLANT
KIT TURNOVER CYSTO (KITS) ×5 IMPLANT
LIGASURE LAP MARYLAND 5MM 37CM (ELECTROSURGICAL) ×5 IMPLANT
NEEDLE HYPO 22GX1.5 SAFETY (NEEDLE) ×5 IMPLANT
NS IRRIG 500ML POUR BTL (IV SOLUTION) ×5 IMPLANT
PACK BASIN MINOR ARMC (MISCELLANEOUS) ×5 IMPLANT
PACK GYN LAPAROSCOPIC (MISCELLANEOUS) ×5 IMPLANT
PAD OB MATERNITY 4.3X12.25 (PERSONAL CARE ITEMS) ×5 IMPLANT
PAD PREP 24X41 OB/GYN DISP (PERSONAL CARE ITEMS) ×5 IMPLANT
SET TUBE SMOKE EVAC HIGH FLOW (TUBING) ×5 IMPLANT
SLEEVE ENDOPATH XCEL 5M (ENDOMECHANICALS) ×10 IMPLANT
SOLUTION ELECTROLUBE (MISCELLANEOUS) ×5 IMPLANT
SPONGE LAP 18X18 RF (DISPOSABLE) ×5 IMPLANT
STRIP CLOSURE SKIN 1/2X4 (GAUZE/BANDAGES/DRESSINGS) ×5 IMPLANT
SUT VIC AB 0 CT1 27 (SUTURE) ×2
SUT VIC AB 0 CT1 27XCR 8 STRN (SUTURE) ×8 IMPLANT
SUT VIC AB 0 CT1 36 (SUTURE) ×10 IMPLANT
SUT VIC AB 4-0 FS2 27 (SUTURE) ×5 IMPLANT
SUT VICRYL 0 AB UR-6 (SUTURE) ×5 IMPLANT
SUT VICRYL 4-0  27 PS-2 BARIAT (SUTURE) ×1
SUT VICRYL 4-0 27 PS-2 BARIAT (SUTURE) ×4
SUTURE VICRYL 4-0 27 PS-2 BART (SUTURE) ×4 IMPLANT
SYR 10ML LL (SYRINGE) ×5 IMPLANT
TAPE TRANSPORE STRL 2 31045 (GAUZE/BANDAGES/DRESSINGS) ×5 IMPLANT
TOWEL OR 17X26 4PK STRL BLUE (TOWEL DISPOSABLE) ×5 IMPLANT
TROCAR ENDO BLADELESS 11MM (ENDOMECHANICALS) IMPLANT
TROCAR XCEL NON-BLD 5MMX100MML (ENDOMECHANICALS) ×5 IMPLANT
TROCAR XCEL UNIV SLVE 11M 100M (ENDOMECHANICALS) IMPLANT
TUBING CONNECTING 10 (TUBING) ×5 IMPLANT
TUBING EVAC SMOKE HEATED PNEUM (TUBING) ×5 IMPLANT

## 2019-08-29 NOTE — Op Note (Signed)
OPERATIVE NOTE 08/29/2019 11:33 AM  PRE-OPERATIVE DIAGNOSIS:  1) Fibroids Complex ovarian cyst  Pelvic pain in female  POST-OPERATIVE DIAGNOSIS:  Same with large abnormal appearing right ovarian cyst too.  OPERATION: Procedure(s) (LRB): LAPAROSCOPIC ASSISTED VAGINAL HYSTERECTOMY (N/A) LAPAROSCOPIC BILATERAL SALPINGO OOPHORECTOMY (Bilateral) INTRAUTERINE DEVICE (IUD) REMOVAL     SURGEON(S): Surgeon(s) and Role:    Harlin Heys, MD - Primary    * Rubie Maid, MD No other capable assistant available for this surgery which requires an experienced, high level assistant.   ANESTHESIA: General  ESTIMATED BLOOD LOSS: 250 mL  OPERATIVE FINDINGS: Large right ovarian cyst in addition to known left ovarian cyst.  SPECIMEN:  ID Type Source Tests Collected by Time Destination  1 : uterus with cervix, bilateral tubes and ovaries Tissue PATH Gyn benign resection SURGICAL PATHOLOGY Harlin Heys, MD [id number]     COMPLICATIONS: None  DRAINS: Foley to gravity  DISPOSITION: Stable to recovery room  DESCRIPTION OF PROCEDURE:      The patient was prepped and draped in the dorsolithotomy position and placed under general anesthesia. The bladder was emptied. The cervix was grasped with a multi-toothed tenaculum and a uterine manipulator was placed within the cervical os respecting the position and curvature of the uterus. After changing gloves we proceeded abdominally. A small infraumbilical incision was made and a 5 mm trocar port was placed within the abdominopelvic cavity. The opening pressure was less than 7 mmHg.  Approximately 3 and 1/2 L of carbon dioxide gas was instilled within the abdominal pelvic cavity. The laparoscope was placed and the pelvis and abdomen were carefully inspected. In the usual manner, under direct visualization right and left lower quadrant ports of 5 mm size were placed. Both ureters were identified in the pelvis prior to dissection or  clamping and cutting of pedicles. The infundibulopelvic ligaments were carefully identified. The ureters were again identified out of the operative field. The ligaments were triply coagulated and divided. Hemostasis of the pedicles was noted.  The fallopian tubes were elevated and the mesenteric side systematically coagulated and divided allowing the tube to be removed at the time of uterine and ovarian removal. The round ligaments were coagulated and divided and a bladder flap was created. The upper aspect of the broad ligament was clamped coagulated and divided. The uterine arteries were skeletonized, triply coagulated and divided. Careful inspection of all pedicles and the remainder of the pelvis was performed. Hemostasis was noted. The lower quadrant ports were removed, hemostasis of the port sites was noted, and the incisions were closed in subcuticular manner. The laparoscope and trocar sleeve were removed from the infraumbilical incision, hemostasis was noted, and the incision was closed in a subcuticular manner. A long-acting anesthetic was employed in the skin incisions. We then proceeded vaginally. A weighted speculum was placed posteriorly. A multi-toothed tenaculum was used to grasp the cervix and the cervix was injected in a circumferential manner with a dilute Pitressin solution. An incision was made around the cervix and the vaginal mucosa was dissected off of the cervix. The posterior cul-de-sac was identified and entered and the weighted speculum was placed within this. The anterior cul-de-sac was identified and entered and a retractor was placed and used to retract the bladder anteriorly keeping it out of the operative field. The uterosacral ligaments were clamped divided and suture ligated. The cardinal ligaments were clamped divided and suture ligated. The small remaining pedicle was clamped divided and suture ligated bilaterally  allowing delivery of the specimen. Angle sutures were placed  in the manner. A culdoplasty was performed. The peritoneum was identified anteriorly and then incorporating the left upper pedicle left lower pedicle right lower pedicle right upper pedicle and anterior peritoneum a pursestring suture was placed exteriorizing all pedicles. Hemostasis of all pedicles was noted at this time. The vaginal mucosa was then closed with a running suture of Vicryl. The patient went to recovery room in stable condition. Clear urine was noted in the Foley at the conclusion of the procedure.  Finis Bud, M.D. 08/29/2019 11:33 AM

## 2019-08-29 NOTE — Anesthesia Preprocedure Evaluation (Signed)
Anesthesia Evaluation  Patient identified by MRN, date of birth, ID band Patient awake    Reviewed: Allergy & Precautions, H&P , NPO status , Patient's Chart, lab work & pertinent test results  History of Anesthesia Complications Negative for: history of anesthetic complications  Airway Mallampati: III  TM Distance: <3 FB Neck ROM: full    Dental  (+) Chipped, Poor Dentition   Pulmonary neg shortness of breath, Current Smoker and Patient abstained from smoking.,           Cardiovascular Exercise Tolerance: Good (-) angina(-) Past MI and (-) DOE negative cardio ROS       Neuro/Psych PSYCHIATRIC DISORDERS negative neurological ROS     GI/Hepatic negative GI ROS, Neg liver ROS, neg GERD  ,  Endo/Other  negative endocrine ROS  Renal/GU      Musculoskeletal   Abdominal   Peds  Hematology negative hematology ROS (+)   Anesthesia Other Findings Past Medical History: No date: Depression No date: Focal nodular hyperplasia determined by liver biopsy No date: Leukocytosis No date: Tobacco use  Past Surgical History: 2016: BREAST BIOPSY; Right     Comment:  [place] core, fat necrosis 03/25/2018: COLONOSCOPY WITH PROPOFOL; N/A     Comment:  Procedure: COLONOSCOPY WITH PROPOFOL;  Surgeon: Toledo,               Benay Pike, MD;  Location: [facility];  Service:               Gastroenterology;  Laterality: N/A; 03/25/2018: ESOPHAGOGASTRODUODENOSCOPY (EGD) WITH PROPOFOL; N/A     Comment:  Procedure: ESOPHAGOGASTRODUODENOSCOPY (EGD) WITH               PROPOFOL;  Surgeon: Toledo, Benay Pike, MD;  Location:               [facility];  Service: Gastroenterology;  Laterality:               N/A; No date: INTRAUTERINE DEVICE INSERTION 02/2015: KNEE CARTILAGE SURGERY; Right No date: LAPAROSCOPIC TUBAL LIGATION  BMI    Body Mass Index: 31.37 kg/m      Reproductive/Obstetrics negative OB ROS                              Anesthesia Physical Anesthesia Plan  ASA: III  Anesthesia Plan: General ETT   Post-op Pain Management:    Induction: Intravenous  PONV Risk Score and Plan: Ondansetron, Dexamethasone, Midazolam and Treatment may vary due to age or medical condition  Airway Management Planned: Oral ETT  Additional Equipment:   Intra-op Plan:   Post-operative Plan: Extubation in OR  Informed Consent: I have reviewed the patients History and Physical, chart, labs and discussed the procedure including the risks, benefits and alternatives for the proposed anesthesia with the patient or authorized representative who has indicated his/her understanding and acceptance.     Dental Advisory Given  Plan Discussed with: Anesthesiologist, CRNA and Surgeon  Anesthesia Plan Comments: (Patient consented for risks of anesthesia including but not limited to:  - adverse reactions to medications - damage to teeth, lips or other oral mucosa - sore throat or hoarseness - Damage to heart, brain, lungs or loss of life  Patient voiced understanding.)        Anesthesia Quick Evaluation

## 2019-08-29 NOTE — Interval H&P Note (Signed)
History and Physical Interval Note:  08/29/2019 8:55 AM  [name]  has presented today for surgery, with the diagnosis of Fibroids Complex ovarian csyt Pelvic pain in female.  The various methods of treatment have been discussed with the patient and family. After consideration of risks, benefits and other options for treatment, the patient has consented to  Procedure(s): LAPAROSCOPIC ASSISTED VAGINAL HYSTERECTOMY (N/A) LAPAROSCOPIC UNILATERAL SALPINGO OOPHORECTOMY (Left) as a surgical intervention.  The patient's history has been reviewed, patient examined, no change in status, stable for surgery.  I have reviewed the patient's chart and labs.  Questions were answered to the patient's satisfaction.     Jeannie Fend

## 2019-08-29 NOTE — Transfer of Care (Signed)
Immediate Anesthesia Transfer of Care Note  Patient: [name]  Procedure(s) Performed: LAPAROSCOPIC ASSISTED VAGINAL HYSTERECTOMY (N/A ) LAPAROSCOPIC BILATERAL SALPINGO OOPHORECTOMY (Bilateral ) INTRAUTERINE DEVICE (IUD) REMOVAL  Patient Location: PACU  Anesthesia Type:General  Level of Consciousness: sedated  Airway & Oxygen Therapy: Patient Spontanous Breathing and Patient connected to face mask oxygen  Post-op Assessment: Report given to RN and Post -op Vital signs reviewed and stable  Post vital signs: Reviewed and stable  Last Vitals:  Vitals Value Taken Time  BP 112/72 08/29/19 1201  Temp    Pulse 93 08/29/19 1205  Resp 22 08/29/19 1200  SpO2 94 % 08/29/19 1205  Vitals shown include unvalidated device data.  Last Pain:  Vitals:   08/29/19 1203  TempSrc:   PainSc: (P) 0-No pain         Complications: No apparent anesthesia complications

## 2019-08-29 NOTE — Anesthesia Procedure Notes (Signed)
Procedure Name: Intubation Date/Time: 08/29/2019 9:43 AM Performed by: Justus Memory, CRNA Pre-anesthesia Checklist: Patient identified, Patient being monitored, Timeout performed, Emergency Drugs available and Suction available Patient Re-evaluated:Patient Re-evaluated prior to induction Oxygen Delivery Method: Circle system utilized Preoxygenation: Pre-oxygenation with 100% oxygen Induction Type: IV induction Ventilation: Mask ventilation without difficulty Laryngoscope Size: Mac, 3 and McGraph Grade View: Grade I Tube type: Oral Tube size: 7.0 mm Number of attempts: 1 Airway Equipment and Method: Stylet,  Rigid stylet and Video-laryngoscopy Placement Confirmation: ETT inserted through vocal cords under direct vision,  positive ETCO2 and breath sounds checked- equal and bilateral Secured at: 21 cm Tube secured with: Tape Dental Injury: Teeth and Oropharynx as per pre-operative assessment

## 2019-08-29 NOTE — Anesthesia Post-op Follow-up Note (Signed)
Anesthesia QCDR form completed.        

## 2019-08-30 ENCOUNTER — Encounter: Payer: Self-pay | Admitting: Obstetrics and Gynecology

## 2019-08-30 ENCOUNTER — Telehealth: Payer: Self-pay | Admitting: Obstetrics and Gynecology

## 2019-08-30 DIAGNOSIS — D251 Intramural leiomyoma of uterus: Secondary | ICD-10-CM | POA: Diagnosis not present

## 2019-08-30 MED ORDER — ESTRADIOL 1 MG PO TABS: 1.0000 mg | ORAL_TABLET | Freq: Every day | ORAL | 0 refills | Status: DC

## 2019-08-30 MED ORDER — HYDROCODONE-ACETAMINOPHEN 5-325 MG PO TABS: 1.0000 | ORAL_TABLET | Freq: Four times a day (QID) | ORAL | 0 refills | Status: DC | PRN

## 2019-08-30 NOTE — Discharge Summary (Signed)
    Discharge Summary  Admit date: 08/29/2019  Discharge Date and Time:08/30/2019  8:18 AM  Discharge to:  Home  Admission Diagnosis:  Pelvic Pain Dub Ovarian cyst                    Discharge  Diagnoses:  Same with bilateral ovarian cysts   OR Procedures:   Procedure(s): LAPAROSCOPIC ASSISTED VAGINAL HYSTERECTOMY LAPAROSCOPIC BILATERAL SALPINGO OOPHORECTOMY INTRAUTERINE DEVICE (IUD) REMOVAL Date -------------------                              Discharge Day Progress Note:   Subjective:   The patient does not have complaints.  She is ambulating well. She is taking PO well. Her pain is well controlled with her current medications. She is urinating without difficulty and is passing flatus.   Objective:  BP 117/74 (BP Location: Right Arm)   Pulse 85   Temp 98.2 F (36.8 C) (Oral)   Resp 18   Ht 5\' 2"  (1.575 m)   Wt 77.8 kg   LMP 08/22/2019   SpO2 98%   BMI 31.37 kg/m     Abdomen:                         clean, dry, no drainage, healing    Assessment:   Doing well.  Normal progress as expected.   Desires discharge  Plan:        Discharge home.                       Medications as directed.  Hospital Course:  No notes on file   Condition at Discharge:  good Discharge Medications:  Allergies as of 08/30/2019   No Known Allergies     Medication List    STOP taking these medications   amoxicillin-clavulanate 875-125 MG tablet Commonly known as: AUGMENTIN   fluticasone 50 MCG/ACT nasal spray Commonly known as: FLONASE   gabapentin 100 MG capsule Commonly known as: NEURONTIN   levonorgestrel 20 MCG/24HR IUD Commonly known as: MIRENA   methylPREDNISolone 4 MG Tbpk tablet Commonly known as: MEDROL DOSEPAK   metroNIDAZOLE 500 MG tablet Commonly known as: FLAGYL     TAKE these medications   estradiol 1 MG tablet Commonly known as: ESTRACE Take 1 tablet (1 mg total) by mouth daily.   HYDROcodone-acetaminophen 5-325 MG tablet Commonly known as:  NORCO/VICODIN Take 1-2 tablets by mouth every 6 (six) hours as needed for moderate pain.        Follow Up:   Follow-up Information    Harlin Heys, MD Follow up in 1 week(s).   Specialties: Obstetrics and Gynecology, Radiology Contact information: [address] [place] [address] [phone]           Finis Bud, M.D. 08/30/2019 8:18 AM

## 2019-08-30 NOTE — Telephone Encounter (Signed)
Pt is requesting acid ref;ex Med that can be send in for pharm. Please advise .

## 2019-08-30 NOTE — Progress Notes (Signed)
Patient discharged home with husband. Discharge instructions and prescriptions given and reviewed with patient. Follow up appointment made for 2 weeks post-op due to booked schedule at [facility]- office to call if appointment becomes available sooner. Patient verbalized understanding. Escorted out by staff.

## 2019-08-30 NOTE — Anesthesia Postprocedure Evaluation (Signed)
Anesthesia Post Note  Patient: [name]  Procedure(s) Performed: LAPAROSCOPIC ASSISTED VAGINAL HYSTERECTOMY (N/A ) LAPAROSCOPIC BILATERAL SALPINGO OOPHORECTOMY (Bilateral ) INTRAUTERINE DEVICE (IUD) REMOVAL  Patient location during evaluation: PACU Anesthesia Type: General Level of consciousness: awake and alert Pain management: pain level controlled Vital Signs Assessment: post-procedure vital signs reviewed and stable Respiratory status: spontaneous breathing, nonlabored ventilation, respiratory function stable and patient connected to nasal cannula oxygen Cardiovascular status: blood pressure returned to baseline and stable Postop Assessment: no apparent nausea or vomiting Anesthetic complications: no     Last Vitals:  Vitals:   08/30/19 0413 08/30/19 0759  BP: 105/75 117/74  Pulse: 86 85  Resp:  18  Temp: 36.9 C 36.8 C  SpO2: 99% 98%    Last Pain:  Vitals:   08/30/19 0759  TempSrc: Oral  PainSc:                  Precious Haws Delanda Bulluck

## 2019-08-31 NOTE — Telephone Encounter (Signed)
Spoke with patient and she is not having the acid reflux now. She stated that it got better last night. I told her that if it starts back to try Tums over the counter. Patient verbalized understanding.

## 2019-09-01 ENCOUNTER — Telehealth: Payer: Self-pay

## 2019-09-01 LAB — SURGICAL PATHOLOGY

## 2019-09-01 NOTE — Telephone Encounter (Signed)
Patient is feeling gassy. Wants to know if she can take a stool softer? Please call to advise patient.

## 2019-09-02 NOTE — Telephone Encounter (Signed)
LM for patient to return call.

## 2019-09-05 NOTE — Telephone Encounter (Signed)
Sent patient mychart message

## 2019-09-06 ENCOUNTER — Encounter: Payer: Self-pay | Admitting: Obstetrics and Gynecology

## 2019-09-06 ENCOUNTER — Other Ambulatory Visit: Payer: Self-pay

## 2019-09-06 ENCOUNTER — Ambulatory Visit (INDEPENDENT_AMBULATORY_CARE_PROVIDER_SITE_OTHER): Payer: Medicaid Other | Admitting: Obstetrics and Gynecology

## 2019-09-06 VITALS — BP 115/81 | HR 102 | Ht 62.0 in | Wt 168.4 lb

## 2019-09-06 DIAGNOSIS — Z9889 Other specified postprocedural states: Secondary | ICD-10-CM

## 2019-09-06 NOTE — Progress Notes (Signed)
Patient comes in today for 1 week post op. She has no problems or concerns today.

## 2019-09-06 NOTE — Progress Notes (Signed)
HPI:      [name] is a 43 y.o. G4P0010 who LMP was Patient's last menstrual period was 08/22/2019.  Subjective:   She presents today for postop visit after LAVH BSO.  She reports that she is having "no problems".  Reports no pain vaginal discharge bleeding problems with bowel movements or urination.  She is very happy and excited about her surgery. She is taking her estrogen as directed and denies regular hot flashes.    Hx: The following portions of the patient's history were reviewed and updated as appropriate:             She  has a past medical history of Depression, Focal nodular hyperplasia determined by liver biopsy, Leukocytosis, and Tobacco use. She does not have any pertinent problems on file. She  has a past surgical history that includes Knee cartilage surgery (Right, 02/2015); Laparoscopic tubal ligation; Breast biopsy (Right, 2016); Esophagogastroduodenoscopy (egd) with propofol (N/A, 03/25/2018); Colonoscopy with propofol (N/A, 03/25/2018); Intrauterine device insertion; Laparoscopic assisted vaginal hysterectomy (N/A, 08/29/2019); Laparoscopic bilateral salpingo oophorectomy (Bilateral, 08/29/2019); and IUD removal (08/29/2019). Her family history includes Breast cancer (age of onset: 81) in her maternal grandmother; Hypertension in her father and mother; Prostate cancer in her father. She  reports that she has been smoking cigarettes. She has a 7.00 pack-year smoking history. She has never used smokeless tobacco. She reports that she does not drink alcohol or use drugs. She has a current medication list which includes the following prescription(s): estradiol and hydrocodone-acetaminophen. She has No Known Allergies.       Review of Systems:  Review of Systems  Constitutional: Denied constitutional symptoms, night sweats, recent illness, fatigue, fever, insomnia and weight loss.  Eyes: Denied eye symptoms, eye pain, photophobia, vision change and visual disturbance.   Ears/Nose/Throat/Neck: Denied ear, nose, throat or neck symptoms, hearing loss, nasal discharge, sinus congestion and sore throat.  Cardiovascular: Denied cardiovascular symptoms, arrhythmia, chest pain/pressure, edema, exercise intolerance, orthopnea and palpitations.  Respiratory: Denied pulmonary symptoms, asthma, pleuritic pain, productive sputum, cough, dyspnea and wheezing.  Gastrointestinal: Denied, gastro-esophageal reflux, melena, nausea and vomiting.  Genitourinary: Denied genitourinary symptoms including symptomatic vaginal discharge, pelvic relaxation issues, and urinary complaints.  Musculoskeletal: Denied musculoskeletal symptoms, stiffness, swelling, muscle weakness and myalgia.  Dermatologic: Denied dermatology symptoms, rash and scar.  Neurologic: Denied neurology symptoms, dizziness, headache, neck pain and syncope.  Psychiatric: Denied psychiatric symptoms, anxiety and depression.  Endocrine: Denied endocrine symptoms including hot flashes and night sweats.   Meds:   Current Outpatient Medications on File Prior to Visit  Medication Sig Dispense Refill  . estradiol (ESTRACE) 1 MG tablet Take 1 tablet (1 mg total) by mouth daily. 90 tablet 0  . HYDROcodone-acetaminophen (NORCO/VICODIN) 5-325 MG tablet Take 1-2 tablets by mouth every 6 (six) hours as needed for moderate pain. 20 tablet 0   No current facility-administered medications on file prior to visit.    Objective:     Vitals:   09/06/19 1107  BP: 115/81  Pulse: (!) 102              Pathology results reviewed directly with the patient  Abdomen: Soft.  Non-tender.  No masses.  No HSM.  Incision/s: Intact.  Healing well.  No erythema.  No drainage.      Assessment:    G4P0010 Patient Active Problem List   Diagnosis Date Noted  . Status post surgery 08/29/2019  . GI bleed 03/23/2018  . Leucocytosis 03/04/2016  1. Post-operative state     Patient with excellent recovery postop.  Pathology shows  multiple hemorrhagic cysts, adenomyosis, fibroids.   Plan:            1.  Continue postop care.  2.  Continue estrogen as directed. Orders No orders of the defined types were placed in this encounter.   No orders of the defined types were placed in this encounter.     F/U  Return in about 5 weeks (around 10/11/2019).  Finis Bud, M.D. 09/06/2019 11:39 AM

## 2019-09-13 ENCOUNTER — Encounter: Payer: Medicaid Other | Admitting: Obstetrics and Gynecology

## 2019-09-21 ENCOUNTER — Other Ambulatory Visit: Payer: Self-pay

## 2019-09-21 ENCOUNTER — Telehealth: Payer: Self-pay

## 2019-09-21 ENCOUNTER — Emergency Department
Admission: EM | Admit: 2019-09-21 | Discharge: 2019-09-21 | Disposition: A | Discharge status: Home / Self Care | Payer: Medicaid Other | Attending: Emergency Medicine | Admitting: Emergency Medicine

## 2019-09-21 DIAGNOSIS — Z5321 Procedure and treatment not carried out due to patient leaving prior to being seen by health care provider: Secondary | ICD-10-CM | POA: Insufficient documentation

## 2019-09-21 DIAGNOSIS — K625 Hemorrhage of anus and rectum: Secondary | ICD-10-CM | POA: Diagnosis present

## 2019-09-21 LAB — COMPREHENSIVE METABOLIC PANEL
ALT: 27 U/L (ref 0–44)
AST: 22 U/L (ref 15–41)
Albumin: 4.5 g/dL (ref 3.5–5.0)
Alkaline Phosphatase: 75 U/L (ref 38–126)
Anion gap: 10 (ref 5–15)
BUN: 7 mg/dL (ref 6–20)
CO2: 26 mmol/L (ref 22–32)
Calcium: 9.3 mg/dL (ref 8.9–10.3)
Chloride: 104 mmol/L (ref 98–111)
Creatinine, Ser: 0.58 mg/dL (ref 0.44–1.00)
GFR calc Af Amer: >60 mL/min (ref >60)
GFR calc non Af Amer: >60 mL/min (ref >60)
Glucose, Bld: 92 mg/dL (ref 70–99)
Potassium: 3.9 mmol/L (ref 3.5–5.1)
Sodium: 140 mmol/L (ref 135–145)
Total Bilirubin: 1.2 mg/dL (ref 0.3–1.2)
Total Protein: 7.8 g/dL (ref 6.5–8.1)

## 2019-09-21 LAB — URINALYSIS, ROUTINE W REFLEX MICROSCOPIC
Bilirubin Urine: NEGATIVE
Glucose, UA: NEGATIVE mg/dL
Hgb urine dipstick: NEGATIVE
Ketones, ur: NEGATIVE mg/dL
Leukocytes,Ua: NEGATIVE
Nitrite: NEGATIVE
Protein, ur: NEGATIVE mg/dL
Specific Gravity, Urine: 1.002 — ABNORMAL LOW (ref 1.005–1.030)
pH: 7 (ref 5.0–8.0)

## 2019-09-21 LAB — CBC
HCT: 44.3 % (ref 36.0–46.0)
Hemoglobin: 14.9 g/dL (ref 12.0–15.0)
MCH: 30.7 pg (ref 26.0–34.0)
MCHC: 33.6 g/dL (ref 30.0–36.0)
MCV: 91.2 fL (ref 80.0–100.0)
Platelets: 478 10*3/uL — ABNORMAL HIGH (ref 150–400)
RBC: 4.86 MIL/uL (ref 3.87–5.11)
RDW: 13.4 % (ref 11.5–15.5)
WBC: 13.3 10*3/uL — ABNORMAL HIGH (ref 4.0–10.5)
nRBC: 0 % (ref 0.0–0.2)

## 2019-09-21 LAB — TYPE AND SCREEN
ABO/RH(D): A POS
Antibody Screen: NEGATIVE

## 2019-09-21 LAB — POCT PREGNANCY, URINE: Preg Test, Ur: NEGATIVE

## 2019-09-21 NOTE — Telephone Encounter (Signed)
Pt says she has slight bleeding during bowel movement. Wants advice from the dr on how to proceed.

## 2019-09-21 NOTE — ED Notes (Signed)
Pt called from [facility] to treatment room, no response 

## 2019-09-21 NOTE — ED Notes (Signed)
Stepping outside for air.

## 2019-09-21 NOTE — ED Notes (Signed)
Final call with no response.

## 2019-09-21 NOTE — ED Triage Notes (Signed)
Pt c/o bright red bleeding with stools today, was concerned due to hysterectomy 3 weeks ago but has a hx of hemorrhoids and has been constipated.

## 2019-09-21 NOTE — Telephone Encounter (Signed)
Spoke with patient and she has had constipation since surgery. She also has a history of hemmroids. I told her per Dr. Amalia Hailey to start taking her stool softener to see if that helps. She will call back if this doe snot help.

## 2019-10-06 ENCOUNTER — Telehealth: Payer: Self-pay | Admitting: Obstetrics and Gynecology

## 2019-10-06 NOTE — Telephone Encounter (Signed)
Pt is having hot flashes pt is wanting a call back from the nurse. Please advise

## 2019-10-07 ENCOUNTER — Ambulatory Visit: Payer: Medicaid Other | Attending: Internal Medicine

## 2019-10-07 DIAGNOSIS — Z20822 Contact with and (suspected) exposure to covid-19: Secondary | ICD-10-CM

## 2019-10-08 LAB — NOVEL CORONAVIRUS, NAA: SARS-CoV-2, NAA: NOT DETECTED

## 2019-10-10 NOTE — Telephone Encounter (Signed)
LM for patient to return call.

## 2019-10-11 ENCOUNTER — Encounter: Payer: Medicaid Other | Admitting: Obstetrics and Gynecology

## 2019-10-28 ENCOUNTER — Ambulatory Visit (INDEPENDENT_AMBULATORY_CARE_PROVIDER_SITE_OTHER): Payer: Medicaid Other | Admitting: Obstetrics and Gynecology

## 2019-10-28 ENCOUNTER — Encounter: Payer: Self-pay | Admitting: Obstetrics and Gynecology

## 2019-10-28 ENCOUNTER — Other Ambulatory Visit: Payer: Self-pay

## 2019-10-28 VITALS — BP 138/95 | HR 105 | Ht 62.0 in | Wt 167.6 lb

## 2019-10-28 DIAGNOSIS — N951 Menopausal and female climacteric states: Secondary | ICD-10-CM

## 2019-10-28 DIAGNOSIS — Z79890 Hormone replacement therapy (postmenopausal): Secondary | ICD-10-CM

## 2019-10-28 MED ORDER — ESTRADIOL 1 MG PO TABS: 1.5000 mg | ORAL_TABLET | Freq: Every day | ORAL | 5 refills | Status: DC

## 2019-10-28 NOTE — Progress Notes (Signed)
HPI:      [name] is a 44 y.o. G4P0010 who LMP was Patient's last menstrual period was 08/22/2019.  Subjective:   She presents today to discuss her hormone replacement therapy.  She is taking Estrace 1 mg.  She continues to have some hot flashes and night sweats and this is led to difficulty sleeping. She also states that after discussing it with her mother her mother explained that the patient's grandmother got breast cancer and had some concerns regarding the possibility of estrogen and breast cancer. The patient has resumed intercourse without problem.   Hx: The following portions of the patient's history were reviewed and updated as appropriate:             She  has a past medical history of Depression, Focal nodular hyperplasia determined by liver biopsy, Leukocytosis, and Tobacco use. She does not have any pertinent problems on file. She  has a past surgical history that includes Knee cartilage surgery (Right, 02/2015); Laparoscopic tubal ligation; Breast biopsy (Right, 2016); Esophagogastroduodenoscopy (egd) with propofol (N/A, 03/25/2018); Colonoscopy with propofol (N/A, 03/25/2018); Intrauterine device insertion; Laparoscopic assisted vaginal hysterectomy (N/A, 08/29/2019); Laparoscopic bilateral salpingo oophorectomy (Bilateral, 08/29/2019); and IUD removal (08/29/2019). Her family history includes Breast cancer (age of onset: 90) in her maternal grandmother; Hypertension in her father and mother; Prostate cancer in her father. She  reports that she has been smoking cigarettes. She has a 7.00 pack-year smoking history. She has never used smokeless tobacco. She reports that she does not drink alcohol or use drugs. She has a current medication list which includes the following prescription(s): estradiol, gabapentin, and estradiol. She has No Known Allergies.       Review of Systems:  Review of Systems  Constitutional: Denied constitutional symptoms, night sweats, recent illness,  fatigue, fever, insomnia and weight loss.  Eyes: Denied eye symptoms, eye pain, photophobia, vision change and visual disturbance.  Ears/Nose/Throat/Neck: Denied ear, nose, throat or neck symptoms, hearing loss, nasal discharge, sinus congestion and sore throat.  Cardiovascular: Denied cardiovascular symptoms, arrhythmia, chest pain/pressure, edema, exercise intolerance, orthopnea and palpitations.  Respiratory: Denied pulmonary symptoms, asthma, pleuritic pain, productive sputum, cough, dyspnea and wheezing.  Gastrointestinal: Denied, gastro-esophageal reflux, melena, nausea and vomiting.  Genitourinary: Denied genitourinary symptoms including symptomatic vaginal discharge, pelvic relaxation issues, and urinary complaints.  Musculoskeletal: Denied musculoskeletal symptoms, stiffness, swelling, muscle weakness and myalgia.  Dermatologic: Denied dermatology symptoms, rash and scar.  Neurologic: Denied neurology symptoms, dizziness, headache, neck pain and syncope.  Psychiatric: Denied psychiatric symptoms, anxiety and depression.  Endocrine: Denied endocrine symptoms including hot flashes and night sweats.   Meds:   Current Outpatient Medications on File Prior to Visit  Medication Sig Dispense Refill  . estradiol (ESTRACE) 1 MG tablet Take 1 tablet (1 mg total) by mouth daily. 90 tablet 0  . gabapentin (NEURONTIN) 100 MG capsule Take 100 mg by mouth 3 (three) times daily.     No current facility-administered medications on file prior to visit.    Objective:     Vitals:   10/28/19 1048  BP: (!) 138/95  Pulse: (!) 105                Assessment:    G4P0010 Patient Active Problem List   Diagnosis Date Noted  . Status post surgery 08/29/2019  . GI bleed 03/23/2018  . Leucocytosis 03/04/2016     1. Symptomatic menopausal or female climacteric states   2. Postmenopausal hormone therapy  It is likely that she is not taking enough estrogen and that increasing to 1.5 will help  with her hot flashes and difficulty sleeping.   Plan:            1.  We have discussed estrogen wrist and breast cancer again in detail.  WHI study discussed.  The prior hysterectomy patients on estrogen alone and the risk of breast cancer compared to the progesterone and estrogen group.  All questions answered.  2.  Increase Estrace to 1.5 mg daily.  I expect this will make a difference.  If she continues to have problems she will contact us. Orders No orders of the defined types were placed in this encounter.    Meds ordered this encounter  Medications  . estradiol (ESTRACE) 1 MG tablet    Sig: Take 1.5 tablets (1.5 mg total) by mouth daily for 90 doses.    Dispense:  45 tablet    Refill:  5      F/U  At approximately the month of her surgery for annual examination.  I spent 23 minutes involved in the care of this patient preparing to see the patient by obtaining and reviewing her medical history (including labs, imaging tests and prior procedures), documenting clinical information in the electronic health record (EHR), counseling and coordinating care plans, writing and sending prescriptions, ordering tests or procedures and directly communicating with the patient by discussing pertinent items from her history and physical exam as well as detailing my assessment and plan as noted above so that she has an informed understanding.  All of her questions were answered.  Finis Bud, M.D. 10/28/2019 11:27 AM

## 2019-11-01 ENCOUNTER — Other Ambulatory Visit: Payer: Self-pay | Admitting: Gastroenterology

## 2019-11-01 ENCOUNTER — Encounter: Payer: Self-pay | Admitting: Gastroenterology

## 2019-11-01 DIAGNOSIS — K640 First degree hemorrhoids: Secondary | ICD-10-CM

## 2019-11-01 MED ORDER — HYDROCORTISONE (PERIANAL) 2.5 % EX CREA: TOPICAL_CREAM | Freq: Three times a day (TID) | CUTANEOUS | 1 refills | Status: AC

## 2019-11-22 ENCOUNTER — Encounter: Payer: Self-pay | Admitting: Gastroenterology

## 2019-11-23 ENCOUNTER — Ambulatory Visit: Payer: Medicaid Other | Admitting: Gastroenterology

## 2019-11-23 ENCOUNTER — Encounter: Payer: Self-pay | Admitting: Gastroenterology

## 2019-11-23 NOTE — Telephone Encounter (Signed)
Canceled appointment. Sending for [employer]

## 2019-12-07 LAB — CATECHOLAMINES, FRACTIONATED, PLASMA
Dopamine: <30 pg/mL (ref 0–48)
Epinephrine: <15 pg/mL (ref 0–62)
Norepinephrine: 460 pg/mL (ref 0–874)

## 2019-12-07 LAB — ALDOSTERONE + RENIN ACTIVITY W/ RATIO
ALDOSTERONE: 6.8 ng/dL (ref 0.0–30.0)
Renin: 2.713 ng/mL/hr (ref 0.167–5.380)

## 2019-12-07 LAB — ACTH: ACTH: 1.6 pg/mL — ABNORMAL LOW (ref 7.2–63.3)

## 2019-12-07 LAB — METANEPHRINES, PHEOCHROMOCYT
Creatinine, Random U: 103 mg/dL
Metaneph Total, Ur: 71 ug/L
Metaneph/Creat Ratio: 0.4 ug/mg Creat (ref 0.0–1.0)
Normetanephrine, Ur: 249 ug/L

## 2019-12-07 LAB — DEXAMETHASONE, BLOOD: Dexamethasone, Blood: 393 ng/dL

## 2019-12-07 LAB — CORTISOL: Cortisol: 0.8 ug/dL

## 2020-01-11 ENCOUNTER — Other Ambulatory Visit: Payer: Self-pay

## 2020-01-11 ENCOUNTER — Encounter: Payer: Self-pay | Admitting: Emergency Medicine

## 2020-01-11 ENCOUNTER — Ambulatory Visit
Admission: EM | Admit: 2020-01-11 | Discharge: 2020-01-11 | Disposition: A | Discharge status: Home / Self Care | Payer: Medicaid Other | Attending: Family Medicine | Admitting: Family Medicine

## 2020-01-11 DIAGNOSIS — R42 Dizziness and giddiness: Secondary | ICD-10-CM | POA: Diagnosis not present

## 2020-01-11 DIAGNOSIS — R350 Frequency of micturition: Secondary | ICD-10-CM

## 2020-01-11 DIAGNOSIS — F411 Generalized anxiety disorder: Secondary | ICD-10-CM

## 2020-01-11 LAB — URINALYSIS, COMPLETE (UACMP) WITH MICROSCOPIC
Bacteria, UA: NONE SEEN
Bilirubin Urine: NEGATIVE
Glucose, UA: NEGATIVE mg/dL
Hgb urine dipstick: NEGATIVE
Ketones, ur: NEGATIVE mg/dL
Leukocytes,Ua: NEGATIVE
Nitrite: NEGATIVE
Protein, ur: NEGATIVE mg/dL
Specific Gravity, Urine: <1.005 — ABNORMAL LOW (ref 1.005–1.030)
WBC, UA: NONE SEEN WBC/hpf (ref 0–5)
pH: 6.5 (ref 5.0–8.0)

## 2020-01-11 MED ORDER — SERTRALINE HCL 50 MG PO TABS: 50.0000 mg | ORAL_TABLET | Freq: Every day | ORAL | 0 refills | Status: DC

## 2020-01-11 NOTE — ED Provider Notes (Signed)
[facility]    CSN: [id number] Arrival date & time: 01/11/20  0809      History   Chief Complaint Chief Complaint  Patient presents with  . Dizziness    HPI [name] is a 44 y.o. female.   44 yo female with a c/o dizziness worse with position changes since this morning. States she's been drinking more water this morning and dizziness has improved. Denies any vision changes, numbness/tingling, headache, fevers, chills, unilateral weakness, ear pain, tinnitus. States she's also been under a lot of stress and has felt more anxious.   Also c/o frequent urination during the night for the past 2 days and slight discomfort. Denies any hematuria, vaginal discharge.      Past Medical History:  Diagnosis Date  . Depression   . 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 Leukocytosis   . Tobacco use     Patient Active Problem List   Diagnosis Date Noted  . Status post surgery 08/29/2019  . GI bleed 03/23/2018  . Leucocytosis 03/04/2016    Past Surgical History:  Procedure Laterality Date  . BREAST BIOPSY Right 2016   [place] core, fat necrosis  . COLONOSCOPY WITH PROPOFOL N/A 03/25/2018   Procedure: COLONOSCOPY WITH PROPOFOL;  Surgeon: Toledo, Benay Pike, MD;  Location: [facility];  Service: Gastroenterology;  Laterality: N/A;  . ESOPHAGOGASTRODUODENOSCOPY (EGD) WITH PROPOFOL N/A 03/25/2018   Procedure: ESOPHAGOGASTRODUODENOSCOPY (EGD) WITH PROPOFOL;  Surgeon: Toledo, Benay Pike, MD;  Location: [facility];  Service: Gastroenterology;  Laterality: N/A;  . INTRAUTERINE DEVICE INSERTION    . IUD REMOVAL  08/29/2019   Procedure: INTRAUTERINE DEVICE (IUD) REMOVAL;  Surgeon: Harlin Heys, MD;  Location: [facility];  Service: Gynecology;;  . KNEE CARTILAGE SURGERY Right 02/2015  . LAPAROSCOPIC ASSISTED VAGINAL HYSTERECTOMY N/A 08/29/2019   Procedure: LAPAROSCOPIC ASSISTED VAGINAL HYSTERECTOMY;  Surgeon: Harlin Heys, MD;  Location: [facility];   Service: Gynecology;  Laterality: N/A;  . LAPAROSCOPIC BILATERAL SALPINGO OOPHERECTOMY Bilateral 08/29/2019   Procedure: LAPAROSCOPIC BILATERAL SALPINGO OOPHORECTOMY;  Surgeon: Harlin Heys, MD;  Location: [facility];  Service: Gynecology;  Laterality: Bilateral;  . LAPAROSCOPIC TUBAL LIGATION      OB History    Gravida  4   Para  3   Term      Preterm      AB  1   Living        SAB  1   TAB      Ectopic      Multiple      Live Births           Obstetric Comments  1st Menstrual Cycle:  18 1st Pregnancy:  18          Home Medications    Prior to Admission medications   Medication Sig Start Date End Date Taking? Authorizing Provider  estradiol (ESTRACE) 1 MG tablet Take 1.5 tablets (1.5 mg total) by mouth daily for 90 doses. 10/28/19 01/26/20 Yes Harlin Heys, MD  estradiol (ESTRACE) 1 MG tablet Take 1 tablet (1 mg total) by mouth daily. 08/30/19   Harlin Heys, MD  gabapentin (NEURONTIN) 100 MG capsule Take 100 mg by mouth 3 (three) times daily.    [provider]  sertraline (ZOLOFT) 50 MG tablet Take 1 tablet (50 mg total) by mouth daily. 01/11/20   Norval Gable, MD    Family History Family History  Problem Relation Age of Onset  . Hypertension  Mother   . Prostate cancer Father   . Hypertension Father   . Breast cancer Maternal Grandmother 44  . Colon cancer Neg Hx     Social History Social History   Tobacco Use  . Smoking status: Current Every Day Smoker    Packs/day: 1.00    Years: 7.00    Pack years: 7.00    Types: Cigarettes  . Smokeless tobacco: Never Used  Substance Use Topics  . Alcohol use: No    Alcohol/week: 0.0 standard drinks  . Drug use: No     Allergies   Patient has no known allergies.   Review of Systems Review of Systems   Physical Exam Triage Vital Signs ED Triage Vitals  Enc Vitals Group     BP 01/11/20 0834 (!) 153/100     Pulse Rate 01/11/20 0834 (!) 102     Resp 01/11/20 0834 18      Temp 01/11/20 0834 98.3 F (36.8 C)     Temp Source 01/11/20 0834 Oral     SpO2 01/11/20 0834 100 %     Weight 01/11/20 0832 160 lb (72.6 kg)     Height 01/11/20 0832 5\' 2"  (1.575 m)     Head Circumference --      Peak Flow --      Pain Score 01/11/20 0832 0     Pain Loc --      Pain Edu? --      Excl. in [place]? --    No data found.  Updated Vital Signs BP (!) 153/100 (BP Location: Right Arm)   Pulse (!) 102 Comment: pt states that she is nervous  Temp 98.3 F (36.8 C) (Oral)   Resp 18   Ht 5\' 2"  (1.575 m)   Wt 72.6 kg   LMP 08/22/2019   SpO2 100%   BMI 29.26 kg/m   Visual Acuity Right Eye Distance:   Left Eye Distance:   Bilateral Distance:    Right Eye Near:   Left Eye Near:    Bilateral Near:     Physical Exam Vitals and nursing note reviewed.  Constitutional:      General: She is not in acute distress.    Appearance: Normal appearance. She is not toxic-appearing or diaphoretic.  HENT:     Right Ear: Tympanic membrane and ear canal normal.     Left Ear: Tympanic membrane normal.  Eyes:     Extraocular Movements: Extraocular movements intact.     Pupils: Pupils are equal, round, and reactive to light.  Cardiovascular:     Rate and Rhythm: Tachycardia present.     Heart sounds: Normal heart sounds.  Pulmonary:     Effort: Pulmonary effort is normal. No respiratory distress.     Breath sounds: Normal breath sounds. No stridor. No wheezing, rhonchi or rales.  Musculoskeletal:     Cervical back: Neck supple. No rigidity.  Neurological:     General: No focal deficit present.     Mental Status: She is alert and oriented to person, place, and time.     Cranial Nerves: No cranial nerve deficit.      UC Treatments / Results  Labs (all labs ordered are listed, but only abnormal results are displayed) Labs Reviewed  URINALYSIS, COMPLETE (UACMP) WITH MICROSCOPIC - Abnormal; Notable for the following components:      Result Value   Specific Gravity, Urine  <1.005 (*)    All other components within normal limits  EKG   Radiology No results found.  Procedures ED EKG  Date/Time: 01/14/2020 1:25 PM Performed by: Norval Gable, MD Authorized by: Norval Gable, MD   ECG reviewed by ED Physician in the absence of a cardiologist: yes   Previous ECG:    Previous ECG:  Compared to current   Similarity:  No change Interpretation:    Interpretation: abnormal   Rate:    ECG rate:  106   ECG rate assessment: tachycardic   Rhythm:    Rhythm: sinus tachycardia   Ectopy:    Ectopy: none   QRS:    QRS axis:  Normal   QRS intervals:  Normal Conduction:    Conduction: normal   ST segments:    ST segments:  Normal T waves:    T waves: normal     (including critical care time)  Medications Ordered in UC Medications - No data to display  Initial Impression / Assessment and Plan / UC Course  I have reviewed the triage vital signs and the nursing notes.  Pertinent labs & imaging results that were available during my care of the patient were reviewed by me and considered in my medical decision making (see chart for details).      Final Clinical Impressions(s) / UC Diagnoses   Final diagnoses:  Dizziness  Anxiety state     Discharge Instructions     Follow up with primary care provider    ED Prescriptions    Medication Sig Dispense Auth. Provider   sertraline (ZOLOFT) 50 MG tablet Take 1 tablet (50 mg total) by mouth daily. 15 tablet Jd Mccaster, Linward Foster, MD      1. UA/EKG results and diagnosis reviewed with patient 2. rx as per orders above; reviewed possible side effects, interactions, risks and benefits  3. Follow up with PCP 4. Follow-up prn if symptoms worsen or don't improve   PDMP not reviewed this encounter.   Norval Gable, MD 01/14/20 1330

## 2020-01-11 NOTE — ED Triage Notes (Signed)
Pt c/o dizziness. Started this morning. She states it is worse when she bends over. She states that she has been under a lot of stress. She increased her water intake and has gotten better. She has also been getting up more in the middle of the night to urinate. She states sometimes she has dysuria. Urinary symptoms started about 2 days ago. Denies fever.

## 2020-01-11 NOTE — Discharge Instructions (Addendum)
Follow up with primary care provider 

## 2020-02-28 ENCOUNTER — Ambulatory Visit: Admission: RE | Admit: 2020-02-28 | Payer: Medicaid Other | Source: Ambulatory Visit

## 2020-03-07 ENCOUNTER — Other Ambulatory Visit: Payer: Self-pay | Admitting: General Surgery

## 2020-03-07 ENCOUNTER — Other Ambulatory Visit: Payer: Self-pay

## 2020-03-07 ENCOUNTER — Ambulatory Visit
Admission: RE | Admit: 2020-03-07 | Discharge: 2020-03-07 | Disposition: A | Discharge status: Home / Self Care | Payer: Medicaid Other | Source: Ambulatory Visit | Attending: General Surgery | Admitting: General Surgery

## 2020-03-07 DIAGNOSIS — E278 Other specified disorders of adrenal gland: Secondary | ICD-10-CM | POA: Insufficient documentation

## 2020-03-08 ENCOUNTER — Other Ambulatory Visit: Payer: Self-pay

## 2020-03-08 ENCOUNTER — Encounter: Payer: Self-pay | Admitting: General Surgery

## 2020-03-08 ENCOUNTER — Ambulatory Visit: Payer: Medicaid Other | Admitting: General Surgery

## 2020-03-08 VITALS — BP 148/95 | HR 105 | Temp 98.1°F | Resp 12 | Ht 62.0 in | Wt 163.0 lb

## 2020-03-08 DIAGNOSIS — E278 Other specified disorders of adrenal gland: Secondary | ICD-10-CM | POA: Diagnosis not present

## 2020-03-08 NOTE — Progress Notes (Signed)
Patient ID: [name], female   DOB: 12-23-1975, 44 y.o.   MRN: [id number]  Chief Complaint  Patient presents with  . Follow-up    1 yr CT adrenal    HPI [name] is a 44 y.o. female.   She is here today for 1 year follow-up of an adrenal mass.  I saw her a year ago, at which time we performed a full biochemical evaluation for adrenal hyperfunction, which was negative.  She is here today for reevaluation after undergoing a noncontrast CT scan yesterday.  In the year since I last saw her, she has undergone hysterectomy.  She has been experiencing night sweats and hot flashes.  She has not been taking estradiol or sertraline because of side effects.  Is not addressed this with her OB/GYN.  In terms of her adrenal function, she denies any palpitations, syncope or near syncope, significant weight fluctuations, proximal muscle weakness, skin changes, issues with her blood pressure, difficulty with glucose control or other symptoms suggestive of adrenal hyperfunction.   Past Medical History:  Diagnosis Date  . Depression   . 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 Leukocytosis   . Tobacco use     Past Surgical History:  Procedure Laterality Date  . BREAST BIOPSY Right 2016   [place] core, fat necrosis  . COLONOSCOPY WITH PROPOFOL N/A 03/25/2018   Procedure: COLONOSCOPY WITH PROPOFOL;  Surgeon: Toledo, Benay Pike, MD;  Location: [facility];  Service: Gastroenterology;  Laterality: N/A;  . ESOPHAGOGASTRODUODENOSCOPY (EGD) WITH PROPOFOL N/A 03/25/2018   Procedure: ESOPHAGOGASTRODUODENOSCOPY (EGD) WITH PROPOFOL;  Surgeon: Toledo, Benay Pike, MD;  Location: [facility];  Service: Gastroenterology;  Laterality: N/A;  . INTRAUTERINE DEVICE INSERTION    . IUD REMOVAL  08/29/2019   Procedure: INTRAUTERINE DEVICE (IUD) REMOVAL;  Surgeon: Harlin Heys, MD;  Location: [facility];  Service: Gynecology;;  . KNEE CARTILAGE SURGERY Right 02/2015  . LAPAROSCOPIC ASSISTED  VAGINAL HYSTERECTOMY N/A 08/29/2019   Procedure: LAPAROSCOPIC ASSISTED VAGINAL HYSTERECTOMY;  Surgeon: Harlin Heys, MD;  Location: [facility];  Service: Gynecology;  Laterality: N/A;  . LAPAROSCOPIC BILATERAL SALPINGO OOPHERECTOMY Bilateral 08/29/2019   Procedure: LAPAROSCOPIC BILATERAL SALPINGO OOPHORECTOMY;  Surgeon: Harlin Heys, MD;  Location: [facility];  Service: Gynecology;  Laterality: Bilateral;  . LAPAROSCOPIC TUBAL LIGATION      Family History  Problem Relation Age of Onset  . Hypertension Mother   . Prostate cancer Father   . Hypertension Father   . Breast cancer Maternal Grandmother 13  . Colon cancer Neg Hx     Social History Social History   Tobacco Use  . Smoking status: Current Every Day Smoker    Packs/day: 1.00    Years: 7.00    Pack years: 7.00    Types: Cigarettes  . Smokeless tobacco: Never Used  Vaping Use  . Vaping Use: Never used  Substance Use Topics  . Alcohol use: No    Alcohol/week: 0.0 standard drinks  . Drug use: No    No Known Allergies  Current Outpatient Medications  Medication Sig Dispense Refill  . gabapentin (NEURONTIN) 100 MG capsule Take 100 mg by mouth 3 (three) times daily.     No current facility-administered medications for this visit.    Review of Systems Review of Systems  Constitutional: Negative for unexpected weight change.  Cardiovascular: Negative for palpitations and leg swelling.  Gastrointestinal: Positive for constipation.  Neurological: Negative for dizziness, syncope, light-headedness and headaches.  All  other systems reviewed and are negative. Or as discussed in the history of present illness  Blood pressure (!) 148/95, pulse (!) 105, temperature 98.1 F (36.7 C), temperature source Oral, resp. rate 12, height 5\' 2"  (1.575 m), weight 163 lb (73.9 kg), last menstrual period 08/22/2019, SpO2 97 %. Body mass index is 29.81 kg/m.  Physical Exam Physical Exam Constitutional:      Appearance: Normal  appearance. She is obese.  HENT:     Head: Normocephalic and atraumatic.     Comments: No facial plethora or moon facies.    Nose:     Comments: Covered with a mask secondary to COVID-19 precautions    Mouth/Throat:     Comments: Covered with a mask secondary to COVID-19 precautions Eyes:     General: No scleral icterus.       Right eye: No discharge.        Left eye: No discharge.  Neck:     Comments: No enlargement of the dorso cervical fat pad Cardiovascular:     Rate and Rhythm: Tachycardia present.     Pulses: Normal pulses.  Pulmonary:     Effort: Pulmonary effort is normal.     Breath sounds: Normal breath sounds.  Abdominal:     General: Bowel sounds are normal.     Palpations: Abdomen is soft.     Tenderness: There is no abdominal tenderness.     Comments: Protuberant, consistent with her level of obesity.  No violaceous striae.  Genitourinary:    Comments: Deferred Musculoskeletal:        General: No swelling or tenderness.  Lymphadenopathy:     Cervical: No cervical adenopathy.  Skin:    General: Skin is warm and dry.  Neurological:     General: No focal deficit present.     Mental Status: She is alert.  Psychiatric:        Mood and Affect: Mood normal.        Behavior: Behavior normal.     Data Reviewed I reviewed the labs performed a year ago but demonstrates normal fractionated catecholamines and metanephrines, normal aldosterone renin ratio, and an appropriately suppressed cortisol after a 1 mg overnight dexamethasone suppression test.  Her CT scan was performed yesterday was reviewed.  I concur with the radiologist impression which is copied here:  CLINICAL DATA:  Adrenal nodule.  EXAM: CT ABDOMEN WITHOUT CONTRAST  TECHNIQUE: Multidetector CT imaging of the abdomen was performed following the standard protocol without IV contrast.  COMPARISON:  02/20/2019  FINDINGS: Lower chest: 8 mm subpleural nodule in the lingula is stable  since 02/20/2019 and comparing back to an older study of 03/23/2018, consistent with benign etiology.  Hepatobiliary: Liver measures 24.1 cm craniocaudal length, enlarged. The liver shows diffusely decreased attenuation suggesting fat deposition. No focal abnormality in the liver on this study without intravenous contrast. There is no evidence for gallstones, gallbladder wall thickening, or pericholecystic fluid. No intrahepatic or extrahepatic biliary dilation.  Pancreas: No focal mass lesion. No dilatation of the main duct. No intraparenchymal cyst. No peripancreatic edema.  Spleen: No splenomegaly. No focal mass lesion.  Adrenals/Urinary Tract: Right adrenal gland unremarkable. 1.8 cm left adrenal nodule is stable since the 03/23/2018 exam consistent with benign etiology. Average attenuation on precontrast imaging today is 8 Hounsfield units consistent with adenoma. 1 mm nonobstructing stone identified lower pole right kidney (69/2). Left kidney unremarkable on noncontrast imaging.  Stomach/Bowel: Stomach is unremarkable. No gastric wall thickening. No evidence of outlet  obstruction. Duodenum is normally positioned as is the ligament of Treitz. No small bowel or colonic dilatation within the visualized abdomen.  Vascular/Lymphatic: No abdominal aortic aneurysm. No abdominal lymphadenopathy.  Other: No intraperitoneal free fluid.  Musculoskeletal: No worrisome lytic or sclerotic osseous abnormality.  IMPRESSION: 1. 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 2. Hepatomegaly with hepatic steatosis. 3. 1 mm nonobstructing stone lower pole right kidney.   Assessment 44 year old woman who was found to have a small left adrenal adenoma.  Biochemical evaluation has been negative.  The nodule has been stable in size and imaging characteristics since 2019.  Plan No additional work-up or imaging surveillance is indicated.  I will see her on an as-needed  basis.    Fredirick Maudlin 03/08/2020, 10:06 AM

## 2020-03-08 NOTE — Patient Instructions (Signed)
Please call our office if you have any questions or concerns.  

## 2020-05-15 ENCOUNTER — Encounter: Payer: Self-pay | Admitting: Emergency Medicine

## 2020-05-15 ENCOUNTER — Ambulatory Visit
Admission: EM | Admit: 2020-05-15 | Discharge: 2020-05-15 | Disposition: A | Discharge status: Home / Self Care | Payer: Medicaid Other | Attending: Family Medicine | Admitting: Family Medicine

## 2020-05-15 ENCOUNTER — Other Ambulatory Visit: Payer: Self-pay

## 2020-05-15 DIAGNOSIS — Z79899 Other long term (current) drug therapy: Secondary | ICD-10-CM | POA: Diagnosis not present

## 2020-05-15 DIAGNOSIS — J069 Acute upper respiratory infection, unspecified: Secondary | ICD-10-CM | POA: Diagnosis present

## 2020-05-15 DIAGNOSIS — Z20822 Contact with and (suspected) exposure to covid-19: Secondary | ICD-10-CM | POA: Diagnosis not present

## 2020-05-15 DIAGNOSIS — F17210 Nicotine dependence, cigarettes, uncomplicated: Secondary | ICD-10-CM | POA: Diagnosis not present

## 2020-05-15 MED ORDER — IPRATROPIUM BROMIDE 0.06 % NA SOLN: 2.0000 | Freq: Four times a day (QID) | NASAL | 0 refills | Status: DC | PRN

## 2020-05-15 MED ORDER — BENZONATATE 200 MG PO CAPS: 200.0000 mg | ORAL_CAPSULE | Freq: Three times a day (TID) | ORAL | 0 refills | Status: DC | PRN

## 2020-05-15 NOTE — ED Provider Notes (Signed)
[facility]    CSN: [id number] Arrival date & time: 05/15/20  1106      History   Chief Complaint Chief Complaint  Patient presents with  . Cough  . Nasal Congestion   HPI  44 year old female presents with respiratory symptoms.  Patient reports that her symptoms started today.  She reports that she had a coughing fit today.  She has had some rhinorrhea.  No fever.  He states that the cough is slightly improved currently.  No relieving factors.  Needs COVID-19 testing before return to work.  No other complaints.  Past Medical History:  Diagnosis Date  . Depression   . 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 Leukocytosis   . Tobacco use     Patient Active Problem List   Diagnosis Date Noted  . Status post surgery 08/29/2019  . GI bleed 03/23/2018  . Leucocytosis 03/04/2016    Past Surgical History:  Procedure Laterality Date  . BREAST BIOPSY Right 2016   [place] core, fat necrosis  . COLONOSCOPY WITH PROPOFOL N/A 03/25/2018   Procedure: COLONOSCOPY WITH PROPOFOL;  Surgeon: Toledo, Benay Pike, MD;  Location: [facility];  Service: Gastroenterology;  Laterality: N/A;  . ESOPHAGOGASTRODUODENOSCOPY (EGD) WITH PROPOFOL N/A 03/25/2018   Procedure: ESOPHAGOGASTRODUODENOSCOPY (EGD) WITH PROPOFOL;  Surgeon: Toledo, Benay Pike, MD;  Location: [facility];  Service: Gastroenterology;  Laterality: N/A;  . INTRAUTERINE DEVICE INSERTION    . IUD REMOVAL  08/29/2019   Procedure: INTRAUTERINE DEVICE (IUD) REMOVAL;  Surgeon: Harlin Heys, MD;  Location: [facility];  Service: Gynecology;;  . KNEE CARTILAGE SURGERY Right 02/2015  . LAPAROSCOPIC ASSISTED VAGINAL HYSTERECTOMY N/A 08/29/2019   Procedure: LAPAROSCOPIC ASSISTED VAGINAL HYSTERECTOMY;  Surgeon: Harlin Heys, MD;  Location: [facility];  Service: Gynecology;  Laterality: N/A;  . LAPAROSCOPIC BILATERAL SALPINGO OOPHERECTOMY Bilateral 08/29/2019   Procedure: LAPAROSCOPIC BILATERAL SALPINGO  OOPHORECTOMY;  Surgeon: Harlin Heys, MD;  Location: [facility];  Service: Gynecology;  Laterality: Bilateral;  . LAPAROSCOPIC TUBAL LIGATION      OB History    Gravida  4   Para  3   Term      Preterm      AB  1   Living        SAB  1   TAB      Ectopic      Multiple      Live Births           Obstetric Comments  1st Menstrual Cycle:  18 1st Pregnancy:  18          Home Medications    Prior to Admission medications   Medication Sig Start Date End Date Taking? Authorizing Provider  gabapentin (NEURONTIN) 100 MG capsule Take 100 mg by mouth 3 (three) times daily.   Yes [provider]  benzonatate (TESSALON) 200 MG capsule Take 1 capsule (200 mg total) by mouth 3 (three) times daily as needed for cough. 05/15/20   Thersa Salt G, DO  ipratropium (ATROVENT) 0.06 % nasal spray Place 2 sprays into both nostrils 4 (four) times daily as needed for rhinitis. 05/15/20   Coral Spikes, DO    Family History Family History  Problem Relation Age of Onset  . Hypertension Mother   . Prostate cancer Father   . Hypertension Father   . Breast cancer Maternal Grandmother 60  . Colon cancer Neg Hx     Social History Social History   Tobacco  Use  . Smoking status: Current Every Day Smoker    Packs/day: 1.00    Years: 7.00    Pack years: 7.00    Types: Cigarettes  . Smokeless tobacco: Never Used  Vaping Use  . Vaping Use: Never used  Substance Use Topics  . Alcohol use: No    Alcohol/week: 0.0 standard drinks  . Drug use: No     Allergies   Patient has no known allergies.   Review of Systems Review of Systems  Constitutional: Negative for fever.  HENT: Positive for rhinorrhea.   Respiratory: Positive for cough.    Physical Exam Triage Vital Signs ED Triage Vitals  Enc Vitals Group     BP 05/15/20 1200 130/88     Pulse Rate 05/15/20 1200 94     Resp 05/15/20 1200 18     Temp 05/15/20 1200 98.2 F (36.8 C)     Temp Source 05/15/20  1200 Oral     SpO2 05/15/20 1200 100 %     Weight 05/15/20 1159 160 lb (72.6 kg)     Height 05/15/20 1159 5\' 2"  (1.575 m)     Head Circumference --      Peak Flow --      Pain Score 05/15/20 1158 0     Pain Loc --      Pain Edu? --      Excl. in [place]? --    Updated Vital Signs BP 130/88 (BP Location: Right Arm)   Pulse 94   Temp 98.2 F (36.8 C) (Oral)   Resp 18   Ht 5\' 2"  (1.575 m)   Wt 72.6 kg   LMP 08/22/2019   SpO2 100%   BMI 29.26 kg/m   Visual Acuity Right Eye Distance:   Left Eye Distance:   Bilateral Distance:    Right Eye Near:   Left Eye Near:    Bilateral Near:     Physical Exam Vitals and nursing note reviewed.  Constitutional:      General: She is not in acute distress.    Appearance: Normal appearance. She is not ill-appearing.  HENT:     Head: Normocephalic and atraumatic.     Nose: Nose normal.  Eyes:     General:        Right eye: No discharge.        Left eye: No discharge.     Conjunctiva/sclera: Conjunctivae normal.  Cardiovascular:     Rate and Rhythm: Normal rate and regular rhythm.  Pulmonary:     Effort: Pulmonary effort is normal.     Breath sounds: Normal breath sounds. No wheezing, rhonchi or rales.  Neurological:     Mental Status: She is alert.  Psychiatric:        Mood and Affect: Mood normal.        Behavior: Behavior normal.    UC Treatments / Results  Labs (all labs ordered are listed, but only abnormal results are displayed) Labs Reviewed  SARS CORONAVIRUS 2 (TAT 6-24 HRS)    EKG   Radiology No results found.  Procedures Procedures (including critical care time)  Medications Ordered in UC Medications - No data to display  Initial Impression / Assessment and Plan / UC Course  I have reviewed the triage vital signs and the nursing notes.  Pertinent labs & imaging results that were available during my care of the patient were reviewed by me and considered in my medical decision making (see chart for  details).  44 year old female presents with viral URI with cough.  Awaiting Covid test results.  After nasal spray and Tessalon Perles as prescribed.  Supportive care.  Final Clinical Impressions(s) / UC Diagnoses   Final diagnoses:  Viral URI with cough     Discharge Instructions     Medication as prescribed.  Results available in 24 hours.  Stay home.  Take care  Dr. Lacinda Axon    ED Prescriptions    Medication Sig Dispense Auth. Provider   ipratropium (ATROVENT) 0.06 % nasal spray Place 2 sprays into both nostrils 4 (four) times daily as needed for rhinitis. 15 mL Stepehn Eckard G, DO   benzonatate (TESSALON) 200 MG capsule Take 1 capsule (200 mg total) by mouth 3 (three) times daily as needed for cough. 30 capsule Coral Spikes, DO     PDMP not reviewed this encounter.   Coral Spikes, [place] 05/15/20 1730

## 2020-05-15 NOTE — Discharge Instructions (Signed)
Medication as prescribed.  Results available in 24 hours.  Stay home.  Take care  Dr. Lacinda Axon

## 2020-05-15 NOTE — ED Triage Notes (Signed)
Patient states she had a "coughing attack" at work today. She also reports nasal congestion that started a few days ago.

## 2020-05-16 LAB — SARS CORONAVIRUS 2 (TAT 6-24 HRS): SARS Coronavirus 2: NEGATIVE

## 2020-06-26 ENCOUNTER — Other Ambulatory Visit: Payer: Self-pay

## 2020-06-26 ENCOUNTER — Ambulatory Visit
Admission: EM | Admit: 2020-06-26 | Discharge: 2020-06-26 | Disposition: A | Discharge status: Home / Self Care | Payer: Medicaid Other | Attending: Emergency Medicine | Admitting: Emergency Medicine

## 2020-06-26 DIAGNOSIS — G479 Sleep disorder, unspecified: Secondary | ICD-10-CM

## 2020-06-26 MED ORDER — TRAZODONE HCL 50 MG PO TABS: 50.0000 mg | ORAL_TABLET | Freq: Every day | ORAL | 1 refills | Status: DC

## 2020-06-26 NOTE — Discharge Instructions (Addendum)
Take the Trazodone 20 minutes before bed to help you with sleep.  Resume seeing your therapist.  You need to make sure you are doing self-care weekly. Consider Yoga, exercise, meditation, or massage.  If you develop thoughts of self harm call 911 or go to the ER.

## 2020-06-26 NOTE — ED Provider Notes (Signed)
[facility]    CSN: [id number] Arrival date & time: 06/26/20  0808      History   Chief Complaint Chief Complaint  Patient presents with  . Fatigue    HPI [name] is a 44 y.o. female.   44 yo female here for evaluation of sleep issues ane mental and physical exhaustion. She works in [place] and has been dealing with multiple schedule switches and job stress, she ahs had several deaths in the family and has not had time to grieve. She reports that she works 3rd shift, goes home and lays down and cannot get to sleep due to her mind racing./ She has seen a therapist in the past but is not seeing one currently. She denies SI/HI.      Past Medical History:  Diagnosis Date  . Depression   . 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 Leukocytosis   . Tobacco use     Patient Active Problem List   Diagnosis Date Noted  . Status post surgery 08/29/2019  . GI bleed 03/23/2018  . Leucocytosis 03/04/2016    Past Surgical History:  Procedure Laterality Date  . BREAST BIOPSY Right 2016   [place] core, fat necrosis  . COLONOSCOPY WITH PROPOFOL N/A 03/25/2018   Procedure: COLONOSCOPY WITH PROPOFOL;  Surgeon: Toledo, Benay Pike, MD;  Location: [facility];  Service: Gastroenterology;  Laterality: N/A;  . ESOPHAGOGASTRODUODENOSCOPY (EGD) WITH PROPOFOL N/A 03/25/2018   Procedure: ESOPHAGOGASTRODUODENOSCOPY (EGD) WITH PROPOFOL;  Surgeon: Toledo, Benay Pike, MD;  Location: [facility];  Service: Gastroenterology;  Laterality: N/A;  . INTRAUTERINE DEVICE INSERTION    . IUD REMOVAL  08/29/2019   Procedure: INTRAUTERINE DEVICE (IUD) REMOVAL;  Surgeon: Harlin Heys, MD;  Location: [facility];  Service: Gynecology;;  . KNEE CARTILAGE SURGERY Right 02/2015  . LAPAROSCOPIC ASSISTED VAGINAL HYSTERECTOMY N/A 08/29/2019   Procedure: LAPAROSCOPIC ASSISTED VAGINAL HYSTERECTOMY;  Surgeon: Harlin Heys, MD;  Location: [facility];  Service: Gynecology;  Laterality:  N/A;  . LAPAROSCOPIC BILATERAL SALPINGO OOPHERECTOMY Bilateral 08/29/2019   Procedure: LAPAROSCOPIC BILATERAL SALPINGO OOPHORECTOMY;  Surgeon: Harlin Heys, MD;  Location: [facility];  Service: Gynecology;  Laterality: Bilateral;  . LAPAROSCOPIC TUBAL LIGATION      OB History    Gravida  4   Para  3   Term      Preterm      AB  1   Living        SAB  1   TAB      Ectopic      Multiple      Live Births           Obstetric Comments  1st Menstrual Cycle:  18 1st Pregnancy:  18          Home Medications    Prior to Admission medications   Medication Sig Start Date End Date Taking? Authorizing Provider  benzonatate (TESSALON) 200 MG capsule Take 1 capsule (200 mg total) by mouth 3 (three) times daily as needed for cough. 05/15/20   Coral Spikes, DO  gabapentin (NEURONTIN) 100 MG capsule Take 100 mg by mouth 3 (three) times daily.    [provider]  ipratropium (ATROVENT) 0.06 % nasal spray Place 2 sprays into both nostrils 4 (four) times daily as needed for rhinitis. 05/15/20   Coral Spikes, DO  traZODone (DESYREL) 50 MG tablet Take 1 tablet (50 mg total) by mouth at bedtime. 06/26/20  Margarette Canada, NP    Family History Family History  Problem Relation Age of Onset  . Hypertension Mother   . Prostate cancer Father   . Hypertension Father   . Breast cancer Maternal Grandmother 76  . Colon cancer Neg Hx     Social History Social History   Tobacco Use  . Smoking status: Current Every Day Smoker    Packs/day: 1.25    Years: 7.00    Pack years: 8.75    Types: Cigarettes  . Smokeless tobacco: Never Used  Vaping Use  . Vaping Use: Never used  Substance Use Topics  . Alcohol use: No    Alcohol/week: 0.0 standard drinks  . Drug use: No     Allergies   Patient has no known allergies.   Review of Systems Review of Systems  Constitutional: Positive for fatigue and fever. Negative for activity change and appetite change.  HENT:  Negative for sinus pain.   Respiratory: Negative for cough and shortness of breath.   Cardiovascular: Negative for chest pain and palpitations.  Gastrointestinal: Negative for diarrhea and vomiting.  Genitourinary: Negative.   Musculoskeletal: Negative.   Skin: Negative.   Neurological: Negative for syncope.  Hematological: Negative.   Psychiatric/Behavioral: Positive for sleep disturbance. Negative for suicidal ideas.     Physical Exam Triage Vital Signs ED Triage Vitals  Enc Vitals Group     BP 06/26/20 0823 (!) 150/103     Pulse Rate 06/26/20 0823 (!) 105     Resp 06/26/20 0823 19     Temp 06/26/20 0823 98.5 F (36.9 C)     Temp Source 06/26/20 0823 Oral     SpO2 06/26/20 0823 100 %     Weight 06/26/20 0824 160 lb (72.6 kg)     Height 06/26/20 0824 5\' 2"  (1.575 m)     Head Circumference --      Peak Flow --      Pain Score 06/26/20 0822 2     Pain Loc --      Pain Edu? --      Excl. in [place]? --    No data found.  Updated Vital Signs BP (!) 150/103 (BP Location: Right Arm)   Pulse (!) 105   Temp 98.5 F (36.9 C) (Oral)   Resp 19   Ht 5\' 2"  (1.575 m)   Wt 160 lb (72.6 kg)   LMP 08/22/2019   SpO2 100%   BMI 29.26 kg/m   Visual Acuity Right Eye Distance:   Left Eye Distance:   Bilateral Distance:    Right Eye Near:   Left Eye Near:    Bilateral Near:     Physical Exam Vitals and nursing note reviewed.  Constitutional:      General: She is not in acute distress.    Appearance: Normal appearance.  HENT:     Head: Normocephalic and atraumatic.  Eyes:     General: No scleral icterus.    Extraocular Movements: Extraocular movements intact.     Conjunctiva/sclera: Conjunctivae normal.     Pupils: Pupils are equal, round, and reactive to light.  Cardiovascular:     Rate and Rhythm: Normal rate and regular rhythm.     Pulses: Normal pulses.     Heart sounds: Normal heart sounds. No murmur heard.  No gallop.   Pulmonary:     Effort: Pulmonary effort is  normal.     Breath sounds: Normal breath sounds. No wheezing, rhonchi or rales.  Musculoskeletal:  Cervical back: Normal range of motion.  Skin:    General: Skin is warm and dry.     Capillary Refill: Capillary refill takes less than 2 seconds.     Findings: No lesion.  Neurological:     General: No focal deficit present.     Mental Status: She is alert and oriented to person, place, and time.     Gait: Gait abnormal.  Psychiatric:        Mood and Affect: Mood normal.        Behavior: Behavior normal.        Thought Content: Thought content normal.        Judgment: Judgment normal.      UC Treatments / Results  Labs (all labs ordered are listed, but only abnormal results are displayed) Labs Reviewed - No data to display  EKG   Radiology No results found.  Procedures Procedures (including critical care time)  Medications Ordered in UC Medications - No data to display  Initial Impression / Assessment and Plan / UC Course  I have reviewed the triage vital signs and the nursing notes.  Pertinent labs & imaging results that were available during my care of the patient were reviewed by me and considered in my medical decision making (see chart for details).   Patient is here for evaluation of sleep disturbance secondary to job and life stressors. She is not seeing a therapist at present. She denies SI/HI.  Will recommend she resume seeing a therapist and prescribe a short course of Trazodone for sleep.    Final Clinical Impressions(s) / UC Diagnoses   Final diagnoses:  Sleep disturbance     Discharge Instructions     Take the Trazodone 20 minutes before bed to help you with sleep.  Resume seeing your therapist.  You need to make sure you are doing self-care weekly. Consider Yoga, exercise, meditation, or massage.  If you develop thoughts of self harm call 911 or go to the ER.     ED Prescriptions    Medication Sig Dispense Auth. Provider   traZODone  (DESYREL) 50 MG tablet Take 1 tablet (50 mg total) by mouth at bedtime. 30 tablet Margarette Canada, NP     I have reviewed the PDMP during this encounter.   Margarette Canada, NP 06/26/20 [phone]

## 2020-06-26 NOTE — ED Triage Notes (Signed)
Pt states she has been working long night shifts, has had some deaths in the family. Not sleeping well and feels mentally and emotionally exhausted. Wants some medication to help her mellow out and sleep

## 2020-08-06 ENCOUNTER — Ambulatory Visit: Payer: Medicaid Other | Admitting: Physical Therapy

## 2020-08-08 ENCOUNTER — Encounter: Payer: Self-pay | Admitting: Physical Therapy

## 2020-08-08 ENCOUNTER — Ambulatory Visit: Payer: Medicaid Other | Attending: Physician Assistant | Admitting: Physical Therapy

## 2020-08-08 ENCOUNTER — Other Ambulatory Visit: Payer: Self-pay

## 2020-08-08 DIAGNOSIS — M25561 Pain in right knee: Secondary | ICD-10-CM | POA: Diagnosis not present

## 2020-08-08 DIAGNOSIS — M6281 Muscle weakness (generalized): Secondary | ICD-10-CM | POA: Diagnosis present

## 2020-08-08 DIAGNOSIS — R2689 Other abnormalities of gait and mobility: Secondary | ICD-10-CM | POA: Diagnosis present

## 2020-08-08 NOTE — Therapy (Signed)
[facility] [address] [place], [place], [address] Phone: [phone]   Fax:  [phone]  Physical Therapy Evaluation  Patient Details  Name: [name] MRN: [id number] Date of Birth: 1975-12-23 Referring Provider (PT): Roland Rack, PA-C   Encounter Date: 08/08/2020   PT End of Session - 08/08/20 1105    Visit Number 1    Number of Visits 9    Date for PT Re-Evaluation 09/05/20    PT Start Time 9417    PT Stop Time 1200    PT Time Calculation (min) 55 min    Activity Tolerance Patient tolerated treatment well    Behavior During Therapy [facility] for tasks assessed/performed           Past Medical History:  Diagnosis Date  . Depression   . 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 Leukocytosis   . Tobacco use     Past Surgical History:  Procedure Laterality Date  . BREAST BIOPSY Right 2016   [place] core, fat necrosis  . COLONOSCOPY WITH PROPOFOL N/A 03/25/2018   Procedure: COLONOSCOPY WITH PROPOFOL;  Surgeon: Toledo, Benay Pike, MD;  Location: [facility];  Service: Gastroenterology;  Laterality: N/A;  . ESOPHAGOGASTRODUODENOSCOPY (EGD) WITH PROPOFOL N/A 03/25/2018   Procedure: ESOPHAGOGASTRODUODENOSCOPY (EGD) WITH PROPOFOL;  Surgeon: Toledo, Benay Pike, MD;  Location: [facility];  Service: Gastroenterology;  Laterality: N/A;  . INTRAUTERINE DEVICE INSERTION    . IUD REMOVAL  08/29/2019   Procedure: INTRAUTERINE DEVICE (IUD) REMOVAL;  Surgeon: Harlin Heys, MD;  Location: [facility];  Service: Gynecology;;  . KNEE CARTILAGE SURGERY Right 02/2015  . LAPAROSCOPIC ASSISTED VAGINAL HYSTERECTOMY N/A 08/29/2019   Procedure: LAPAROSCOPIC ASSISTED VAGINAL HYSTERECTOMY;  Surgeon: Harlin Heys, MD;  Location: [facility];  Service: Gynecology;  Laterality: N/A;  . LAPAROSCOPIC BILATERAL SALPINGO OOPHERECTOMY Bilateral 08/29/2019   Procedure: LAPAROSCOPIC BILATERAL SALPINGO OOPHORECTOMY;  Surgeon: Harlin Heys, MD;  Location: [facility];  Service: Gynecology;  Laterality: Bilateral;  . LAPAROSCOPIC TUBAL LIGATION      There were no vitals filed for this visit.    Subjective Assessment - 08/08/20 1108    Subjective Pt. is a 44 year old female with R knee pain. Pt. states she was getting out of the bathtub about 2 weeks ago and felt a snapping sensation in the knee 8/10 knee pain felt. Pt. states the pain has gotten steadily worse, but the brace has helped with pain. Pt. states pain on average 6-7/10 pain. Pain can get down to a 3/10 after prolonged resting. Pt. states that ice makes it worse, has not tried heat. IcyHot does help pain. Pt. has 5 stairs that she needs to use at home, with brace it helps (which was a baseline problem prior after last surgery, slightly more painful now). Pt. states that she noticed swelling in the R knee next day.    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 30 mins    Patient Stated Goals squat from floor to standing, back to work without pain    Currently in Pain? Yes    Pain Score 3     Pain Location Knee    Pain Orientation Right    Pain Descriptors / Indicators Aching  OBJECTIVE  NEUROLOGICAL Mental Status Patient is oriented to person, place and time.  Recent memory is intact.  Remote memory is intact.  Attention span and concentration are intact.  Expressive speech is intact.  Patient's fund of knowledge is within normal limits for educational level.  MUSCULOSKELETAL: Tremor: Absent Bulk: Normal Tone: Normal No trophic changes noted to lower extremities. No ecchymosis, erythema, or edema noted around knee. No gross knee deformity noted  Posture No gross abnormalities noted in standing or seated posture  Gait Antalgic gait on R  Palpation Pain to palpation all  around R knee. With initial flexion and initiation, audible and palpable snapping felt on lateral knee.  ROM WFL for bilat LE PROM and AROM, with exception of pain limitation R knee AROM to 95 deg.   STRENGTH (MMT)  LLE RLE  Hip Flexion 4+/5 Unable to assess due to pain with holding knee in flexed position  Hip Abduction  4/5 4/5* painful at knee  Hip Adduction  5/5 5/5*  Knee Extension 5/5 3/5*  Knee Flexion 4+/5 3+/5*  Dorsiflexion  5/5 5/5  Plantarflexion (seated) 5/5 5/5    Passive Accessory Motion Unable to fully assess motion and structural stability on R knee due to significant muscle guarding.   Meniscal tests Thessalay's positive on R in both positions        [facility] PT Assessment - 08/08/20 0001      Assessment   Medical Diagnosis R knee sprain    Referring Provider (PT) Roland Rack, PA-C    Onset Date/Surgical Date 07/25/20    Prior Therapy yes, with benefit      Balance Screen   Has the patient fallen in the past 6 months No      Prior Function   Level of Independence Independent      Cognition   Overall Cognitive Status Within Functional Limits for tasks assessed            Objective measurements completed on examination: See above findings.    HEP given: pt able to demonstrate good form and instructed to complete to pain tolerance  Supine Heel Slides - 1 x daily - 7 x weekly - 2 sets - 10 reps Seated Isometric Knee Flexion - 1 x daily - 7 x weekly - 2 sets - 10 reps - 10 sec hold Seated Hamstring Set - 1 x daily - 7 x weekly - 2 sets - 10 reps - 10 sec hold Seated Long Arc Quad - 1 x daily - 7 x weekly - 2 sets - 10 reps           PT Education - 08/08/20 1426    Education Details See HEP  ([id number])    Person(s) Educated Patient    Methods Explanation;Demonstration    Comprehension Verbalized understanding;Returned demonstration               PT Long Term Goals - 08/08/20 1106      PT LONG TERM GOAL #1   Title Pt. will  increase FOTO to 66 to demonstrate improvements in pain free functional mobility.    Baseline 11/17: 39    Time 4    Period Weeks    Status New    Target Date 09/05/20      PT LONG TERM GOAL #2   Title Pt. will report ability to stand up from deep squat position with knee pain no more than 4/10 in R knee to improve ability to complete work related  tasks.    Baseline 11/17: pt unable to complete without 8/10 pain    Time 4    Period Weeks    Status New    Target Date 09/05/20      PT LONG TERM GOAL #3   Title Pt. will improve R LE strength by at least half a muslce grade to improve ability to complete work related tasks.    Baseline 11/17: R knee flex: 3+/5, knee ext: 3/5    Time 4    Period Weeks    Status New    Target Date 09/05/20      PT LONG TERM GOAL #4   Title Pt. will improve R knee AROM without pain to at least 120 deg to improve stair climbing ability.    Baseline 11/17: R knee AROM: 95 limited by pain    Time 4    Period Weeks    Status New    Target Date 09/05/20              Plan - 08/08/20 1230    Clinical Impression Statement Pt. is a 44 y.o. woman with R knee pain after standing up from the ground and feeling a snapping sensation. Pt. went to MD and was given a knee brace that she wears at work and sometimes at home. Pt. was unable to have strength fully formally assessed on R LE due to pain felt in R knee with holding positions, strength tests on R LE are as follows: hip abd: 4/5, knee ext: 3/5, knee flex: 3+/5. Dorsiflexion, plantar flexion, and hip adduction are 5/5 bilat. L LE strength scores are as follows: hip flex: 4+/5, hip abd: 4/5, knee ext: 5/5, knee flex: 4+/5. Pt. demonstrates antalgic gait on R with ambulation with knee brace off. Pt. able to demonstrate full PROM and AROM of bilat LE except demonstrates pain limitation in R knee flex to 95 deg AROM. Pt. tender to palpation around entire knee joint, PT felt palpable snapping at R lateral knee. PT  unable to fully assess structural stability of knee due to significant muscle guarding and pt's inability to relax. Pt. positive to R knee Thessalay's in both positions. Pt. given basic motion HEP to begin improving strength and maintain motion in R knee. Pt. will benefit from skilled PT to decrease pain and improve pain free AROM of R knee and to return to work with no pain.    Personal Factors and Comorbidities Past/Current Experience    Examination-Activity Limitations Bend;Carry;Squat;Stairs    Examination-Participation Restrictions Community Activity;Occupation    Stability/Clinical Decision Making Stable/Uncomplicated    Clinical Decision Making Low    Rehab Potential Good    PT Frequency 2x / week    PT Duration 4 weeks    PT Treatment/Interventions ADLs/Self Care Home Management;Cryotherapy;Electrical Stimulation;Iontophoresis 4mg /ml Dexamethasone;Gait training;Neuromuscular re-education;Stair training;Functional mobility training;Therapeutic activities;Therapeutic exercise;Manual techniques;Patient/family education;Passive range of motion    PT Next Visit Plan joint mobs R knee    PT Home Exercise Plan [id number]    Consulted and Agree with Plan of Care Patient           Patient will benefit from skilled therapeutic intervention in order to improve the following deficits and impairments:  Abnormal gait, Decreased activity tolerance, Decreased mobility, Decreased range of motion, Difficulty walking, Decreased strength, Pain, Decreased endurance, Hypomobility  Visit Diagnosis: Acute pain of right knee  Muscle weakness (generalized)  Decreased mobility     Problem List Patient Active Problem List   Diagnosis  Date Noted  . Status post surgery 08/29/2019  . GI bleed 03/23/2018  . Leucocytosis 03/04/2016   Pura Spice, PT, DPT # [id number], SPT 08/08/2020, 2:30 PM  [facility] [address] [place], [place],  [address] Phone: [phone]   Fax:  [phone]  Name: [name] MRN: [id number] Date of Birth: February 27, 1976

## 2020-08-20 ENCOUNTER — Other Ambulatory Visit: Payer: Self-pay | Admitting: Surgical

## 2020-08-20 MED ORDER — ESTRADIOL 1 MG PO TABS: 1.5000 mg | ORAL_TABLET | Freq: Every day | ORAL | 2 refills | Status: DC

## 2020-08-21 ENCOUNTER — Ambulatory Visit: Payer: Medicaid Other

## 2020-08-22 DIAGNOSIS — Z5321 Procedure and treatment not carried out due to patient leaving prior to being seen by health care provider: Secondary | ICD-10-CM | POA: Insufficient documentation

## 2020-08-22 DIAGNOSIS — R1084 Generalized abdominal pain: Secondary | ICD-10-CM | POA: Diagnosis not present

## 2020-08-22 DIAGNOSIS — K625 Hemorrhage of anus and rectum: Secondary | ICD-10-CM | POA: Insufficient documentation

## 2020-08-22 LAB — URINALYSIS, COMPLETE (UACMP) WITH MICROSCOPIC
Bilirubin Urine: NEGATIVE
Glucose, UA: 50 mg/dL — AB
Hgb urine dipstick: NEGATIVE
Ketones, ur: NEGATIVE mg/dL
Nitrite: NEGATIVE
Protein, ur: NEGATIVE mg/dL
Specific Gravity, Urine: 1.003 — ABNORMAL LOW (ref 1.005–1.030)
pH: 6 (ref 5.0–8.0)

## 2020-08-22 LAB — COMPREHENSIVE METABOLIC PANEL
ALT: 18 U/L (ref 0–44)
AST: 16 U/L (ref 15–41)
Albumin: 4.2 g/dL (ref 3.5–5.0)
Alkaline Phosphatase: 77 U/L (ref 38–126)
Anion gap: 8 (ref 5–15)
BUN: 11 mg/dL (ref 6–20)
CO2: 28 mmol/L (ref 22–32)
Calcium: 8.9 mg/dL (ref 8.9–10.3)
Chloride: 101 mmol/L (ref 98–111)
Creatinine, Ser: 0.64 mg/dL (ref 0.44–1.00)
GFR, Estimated: >60 mL/min (ref >60)
Glucose, Bld: 211 mg/dL — ABNORMAL HIGH (ref 70–99)
Potassium: 3.5 mmol/L (ref 3.5–5.1)
Sodium: 137 mmol/L (ref 135–145)
Total Bilirubin: 1.4 mg/dL — ABNORMAL HIGH (ref 0.3–1.2)
Total Protein: 7.3 g/dL (ref 6.5–8.1)

## 2020-08-22 LAB — CBC
HCT: 43.3 % (ref 36.0–46.0)
Hemoglobin: 14.7 g/dL (ref 12.0–15.0)
MCH: 31 pg (ref 26.0–34.0)
MCHC: 33.9 g/dL (ref 30.0–36.0)
MCV: 91.4 fL (ref 80.0–100.0)
Platelets: 398 10*3/uL (ref 150–400)
RBC: 4.74 MIL/uL (ref 3.87–5.11)
RDW: 13 % (ref 11.5–15.5)
WBC: 16.8 10*3/uL — ABNORMAL HIGH (ref 4.0–10.5)
nRBC: 0 % (ref 0.0–0.2)

## 2020-08-22 LAB — LIPASE, BLOOD: Lipase: 42 U/L (ref 11–51)

## 2020-08-22 NOTE — ED Triage Notes (Signed)
Pt to ED reporting generalized abd cramping and bright red rectal bleeding starting 2 days ago but worsening today. Hx of hemorrhoids. No NVD or fevers

## 2020-08-23 ENCOUNTER — Emergency Department
Admission: EM | Admit: 2020-08-23 | Discharge: 2020-08-23 | Disposition: A | Discharge status: Home / Self Care | Payer: Medicaid Other | Attending: Emergency Medicine | Admitting: Emergency Medicine

## 2020-08-23 ENCOUNTER — Ambulatory Visit
Admission: EM | Admit: 2020-08-23 | Discharge: 2020-08-23 | Disposition: A | Discharge status: Home / Self Care | Payer: Medicaid Other | Attending: Internal Medicine | Admitting: Internal Medicine

## 2020-08-23 ENCOUNTER — Other Ambulatory Visit: Payer: Self-pay

## 2020-08-23 ENCOUNTER — Encounter: Payer: Self-pay | Admitting: Emergency Medicine

## 2020-08-23 ENCOUNTER — Ambulatory Visit: Payer: Medicaid Other | Attending: Physician Assistant

## 2020-08-23 DIAGNOSIS — N390 Urinary tract infection, site not specified: Secondary | ICD-10-CM | POA: Diagnosis not present

## 2020-08-23 DIAGNOSIS — R739 Hyperglycemia, unspecified: Secondary | ICD-10-CM

## 2020-08-23 DIAGNOSIS — R112 Nausea with vomiting, unspecified: Secondary | ICD-10-CM

## 2020-08-23 LAB — HEMOGLOBIN A1C
Hgb A1c MFr Bld: 5.2 % (ref 4.8–5.6)
Mean Plasma Glucose: 102.54 mg/dL

## 2020-08-23 MED ORDER — ONDANSETRON HCL 4 MG PO TABS: 4.0000 mg | ORAL_TABLET | Freq: Three times a day (TID) | ORAL | 0 refills | Status: DC | PRN

## 2020-08-23 MED ORDER — NITROFURANTOIN MONOHYD MACRO 100 MG PO CAPS: 100.0000 mg | ORAL_CAPSULE | Freq: Two times a day (BID) | ORAL | 0 refills | Status: DC

## 2020-08-23 NOTE — Discharge Instructions (Addendum)
Avoid all sweets and limit your carbs to 1/4 cup per serving per meal.  Stay on BRAT diet for 48 hours.  I will call you or send you a mychart message with the hemoglogin A1C results.

## 2020-08-23 NOTE — ED Provider Notes (Signed)
[facility]    CSN: [id number] Arrival date & time: 08/23/20  1039      History   Chief Complaint Chief Complaint  Patient presents with  . Abdominal Pain  . Emesis  . Diarrhea    HPI [name] is a 44 y.o. female who presents due to developing lower abd pain x 2 days, vomiting and diarrhea since last night or am hours. She has seen some rectal bleeding and wen to ER last night where they did labs, but she left without being seen after waiting for 3 hours. Has hx of hemorrhoids, so she used hemorrhoid cream and the bleeding has stopped. She missed work and needs a note. Would like to go back Monday.     Past Medical History:  Diagnosis Date  . Depression   . 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 Leukocytosis   . Tobacco use     Patient Active Problem List   Diagnosis Date Noted  . Status post surgery 08/29/2019  . GI bleed 03/23/2018  . Leucocytosis 03/04/2016    Past Surgical History:  Procedure Laterality Date  . BREAST BIOPSY Right 2016   [place] core, fat necrosis  . COLONOSCOPY WITH PROPOFOL N/A 03/25/2018   Procedure: COLONOSCOPY WITH PROPOFOL;  Surgeon: Toledo, Benay Pike, MD;  Location: [facility];  Service: Gastroenterology;  Laterality: N/A;  . ESOPHAGOGASTRODUODENOSCOPY (EGD) WITH PROPOFOL N/A 03/25/2018   Procedure: ESOPHAGOGASTRODUODENOSCOPY (EGD) WITH PROPOFOL;  Surgeon: Toledo, Benay Pike, MD;  Location: [facility];  Service: Gastroenterology;  Laterality: N/A;  . INTRAUTERINE DEVICE INSERTION    . IUD REMOVAL  08/29/2019   Procedure: INTRAUTERINE DEVICE (IUD) REMOVAL;  Surgeon: Harlin Heys, MD;  Location: [facility];  Service: Gynecology;;  . KNEE CARTILAGE SURGERY Right 02/2015  . LAPAROSCOPIC ASSISTED VAGINAL HYSTERECTOMY N/A 08/29/2019   Procedure: LAPAROSCOPIC ASSISTED VAGINAL HYSTERECTOMY;  Surgeon: Harlin Heys, MD;  Location: [facility];  Service: Gynecology;  Laterality: N/A;  . LAPAROSCOPIC  BILATERAL SALPINGO OOPHERECTOMY Bilateral 08/29/2019   Procedure: LAPAROSCOPIC BILATERAL SALPINGO OOPHORECTOMY;  Surgeon: Harlin Heys, MD;  Location: [facility];  Service: Gynecology;  Laterality: Bilateral;  . LAPAROSCOPIC TUBAL LIGATION      OB History    Gravida  4   Para  3   Term      Preterm      AB  1   Living        SAB  1   TAB      Ectopic      Multiple      Live Births           Obstetric Comments  1st Menstrual Cycle:  18 1st Pregnancy:  18          Home Medications    Prior to Admission medications   Medication Sig Start Date End Date Taking? Authorizing Provider  estradiol (ESTRACE) 1 MG tablet Take 1.5 tablets (1.5 mg total) by mouth daily. 08/20/20 08/20/21 Yes Harlin Heys, MD  gabapentin (NEURONTIN) 100 MG capsule Take 100 mg by mouth daily as needed for pain or sleep.   Yes [provider]  traZODone (DESYREL) 50 MG tablet Take 1 tablet (50 mg total) by mouth at bedtime. 06/26/20   Margarette Canada, NP  ipratropium (ATROVENT) 0.06 % nasal spray Place 2 sprays into both nostrils 4 (four) times daily as needed for rhinitis. 05/15/20 06/26/20  Coral Spikes, DO    Family History Family  History  Problem Relation Age of Onset  . Hypertension Mother   . Prostate cancer Father   . Hypertension Father   . Breast cancer Maternal Grandmother 54  . Colon cancer Neg Hx     Social History Social History   Tobacco Use  . Smoking status: Current Every Day Smoker    Packs/day: 1.25    Years: 7.00    Pack years: 8.75    Types: Cigarettes  . Smokeless tobacco: Never Used  Vaping Use  . Vaping Use: Never used  Substance Use Topics  . Alcohol use: No    Alcohol/week: 0.0 standard drinks  . Drug use: No     Allergies   Patient has no known allergies.   Review of Systems Review of Systems  Constitutional: Positive for diaphoresis, fatigue and unexpected weight change. Negative for activity change, appetite change, chills  and fever.  HENT: Negative for congestion.   Respiratory: Negative for cough and shortness of breath.   Gastrointestinal: Positive for abdominal pain, blood in stool, diarrhea and vomiting.       Has hx of hemorrhoid and medication helped this stop  Endocrine: Positive for polydipsia. Negative for polyphagia.       Has been having hot flushes and sweats for the past  week  Genitourinary: Positive for frequency. Negative for difficulty urinating, dysuria and urgency.  Musculoskeletal: Negative for gait problem and neck pain.  Skin: Negative for rash.  Neurological: Negative for dizziness, weakness and headaches.     Physical Exam Triage Vital Signs ED Triage Vitals  Enc Vitals Group     BP 08/23/20 1112 (!) 132/98     Pulse Rate 08/23/20 1112 (!) 128     Resp 08/23/20 1112 18     Temp 08/23/20 1112 98.2 F (36.8 C)     Temp Source 08/23/20 1112 Oral     SpO2 08/23/20 1112 98 %     Weight 08/23/20 1112 150 lb (68 kg)     Height 08/23/20 1112 5\' 2"  (1.575 m)     Head Circumference --      Peak Flow --      Pain Score 08/23/20 1111 6     Pain Loc --      Pain Edu? --      Excl. in [place]? --    No data found.  Updated Vital Signs BP (!) 132/98 (BP Location: Left Arm)   Pulse (!) 128   Temp 98.2 F (36.8 C) (Oral)   Resp 18   Ht 5\' 2"  (1.575 m)   Wt 150 lb (68 kg)   LMP 08/22/2019   SpO2 98%   BMI 27.44 kg/m   Visual Acuity Right Eye Distance:   Left Eye Distance:   Bilateral Distance:    Right Eye Near:   Left Eye Near:    Bilateral Near:     Physical Exam Physical Exam Vitals signs and nursing note reviewed.  Constitutional:      General: She is not in acute distress.    Appearance: Normal appearance. She is not ill-appearing, toxic-appearing or diaphoretic.  HENT:     Head: Normocephalic.     Right Ear: Tympanic membrane, ear canal and external ear normal.     Left Ear: Tympanic membrane, ear canal and external ear normal.     Nose: Nose normal.      Mouth/Throat:     Mouth: Mucous membranes are moist.  Eyes:     General: No scleral  icterus.       Right eye: No discharge.        Left eye: No discharge.     Conjunctiva/sclera: Conjunctivae normal.  Neck:     Musculoskeletal: Neck supple. No neck rigidity.  Cardiovascular:     Rate and Rhythm: Normal rate and regular rhythm.     Heart sounds: No murmur.  Pulmonary:     Effort: Pulmonary effort is normal.     Breath sounds: Normal breath sounds.  Abdominal:     General: Bowel sounds are normal. There is no distension.     Palpations: Abdomen is soft. There is no mass.     Tenderness: There is no abdominal tenderness. There is no guarding or rebound.     Hernia: No hernia is present.  Musculoskeletal: Normal range of motion.  Lymphadenopathy:     Cervical: No cervical adenopathy.  Skin:    General: Skin is warm and dry.     Coloration: Skin is not jaundiced.     Findings: No rash.  Neurological:     Mental Status: She is alert and oriented to person, place, and time.     Gait: Gait normal.  Psychiatric:        Mood and Affect: Mood normal.        Behavior: Behavior normal.        Thought Content: Thought content normal.        Judgment: Judgment normal.     UC Treatments / Results  Labs (all labs ordered are listed, but only abnormal results are displayed) Labs Reviewed - No data to display  EKG   Radiology No results found.  Procedures Procedures (including critical care time)  Medications Ordered in UC Medications - No data to display  Initial Impression / Assessment and Plan / UC Course  I have reviewed the triage vital signs and the nursing notes. I reviewed last night's labs and her WBC was 16k, glucose 211, and UA with large bacterial and small leuks.  HGBA1C is 5.2 today.  She may have a viral GI bug and will follow BRAT diet. I sent Zofran for her as noted and Macrobid for UTI. Pertinent labs  results that were available during my care of the patient  were reviewed by me and considered in my medical decision making (see chart for details).  Final Clinical Impressions(s) / UC Diagnoses   Final diagnoses:  None   Discharge Instructions   None    ED Prescriptions    None     PDMP not reviewed this encounter.   Shelby Mattocks, PA-C 08/23/20 1715

## 2020-08-23 NOTE — ED Triage Notes (Signed)
Patient in today c/o abdominal pain x 2 days, emesis and diarrhea since last night/early morning hours. Patient states she was having some rectal bleeding and went to the ED last night. Patient had labs, but left without being seen. Patient states she has hemorrhoids and has used hemorrhoid cream and the bleeding has stopped.

## 2020-08-28 ENCOUNTER — Ambulatory Visit: Payer: Medicaid Other

## 2020-08-30 ENCOUNTER — Ambulatory Visit: Payer: Medicaid Other

## 2020-10-04 ENCOUNTER — Other Ambulatory Visit: Payer: Self-pay

## 2020-10-04 ENCOUNTER — Encounter: Payer: Self-pay | Admitting: Emergency Medicine

## 2020-10-04 ENCOUNTER — Ambulatory Visit
Admission: EM | Admit: 2020-10-04 | Discharge: 2020-10-04 | Disposition: A | Discharge status: Home / Self Care | Payer: Medicaid Other | Attending: Sports Medicine | Admitting: Sports Medicine

## 2020-10-04 DIAGNOSIS — R059 Cough, unspecified: Secondary | ICD-10-CM | POA: Diagnosis not present

## 2020-10-04 DIAGNOSIS — Z20822 Contact with and (suspected) exposure to covid-19: Secondary | ICD-10-CM | POA: Diagnosis not present

## 2020-10-04 DIAGNOSIS — J029 Acute pharyngitis, unspecified: Secondary | ICD-10-CM | POA: Insufficient documentation

## 2020-10-04 DIAGNOSIS — R350 Frequency of micturition: Secondary | ICD-10-CM | POA: Diagnosis not present

## 2020-10-04 LAB — URINALYSIS, COMPLETE (UACMP) WITH MICROSCOPIC
Bilirubin Urine: NEGATIVE
Glucose, UA: NEGATIVE mg/dL
Ketones, ur: NEGATIVE mg/dL
Leukocytes,Ua: NEGATIVE
Nitrite: NEGATIVE
Protein, ur: NEGATIVE mg/dL
Specific Gravity, Urine: 1.01 (ref 1.005–1.030)
pH: 7 (ref 5.0–8.0)

## 2020-10-04 NOTE — ED Triage Notes (Signed)
Patient c/o sinus congestion and runny nose for the past couple days.  Patient states that her son tested positive for covid today.  Patient also states that she has had dysuria and urinary urgency for the past 3 days. Patient denies fevers.

## 2020-10-04 NOTE — ED Provider Notes (Signed)
[facility]    CSN: [id number] Arrival date & time: 10/04/20  1532      History   Chief Complaint Chief Complaint  Patient presents with  . Sinus Problem  . Covid Exposure  . Urinary Frequency    HPI [name] is a 45 y.o. female.   Patient pleasant 45 year old female who presents for evaluation of a few days of mild URI symptoms.  She reports a tickle in her throat a cough.  She questions whether or not she has COVID because her son tested positive today.  Denies any chest pain shortness of breath.  No fever shakes chills.  No nausea vomiting or diarrhea.  She has been vaccinated and has had her booster.  She is also had her influenza vaccine.  She denies chest pain or shortness of breath.  No major medical problems identified by the patient.  She also reports having recent UTI back in early December.  She was seen here.  There was some glucose in her urine as well.  Her subsequent hemoglobin A1c was normal.  Her symptoms have essentially resolved but she has a little bit of incomplete voiding.  No dysuria or hematuria or increased frequency.  She says she started a new job over at [employer] and is not able to go to the bathroom as much as she would like.  She just wanted her urine checked to make sure everything had cleared.  No flank pain or concern for renal stones.     Past Medical History:  Diagnosis Date  . Depression   . 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 Leukocytosis   . Tobacco use     Patient Active Problem List   Diagnosis Date Noted  . Status post surgery 08/29/2019  . GI bleed 03/23/2018  . Leucocytosis 03/04/2016    Past Surgical History:  Procedure Laterality Date  . ABDOMINAL HYSTERECTOMY    . BREAST BIOPSY Right 2016   [place] core, fat necrosis  . COLONOSCOPY WITH PROPOFOL N/A 03/25/2018   Procedure: COLONOSCOPY WITH PROPOFOL;  Surgeon: Toledo, Benay Pike, MD;  Location: [facility];  Service: Gastroenterology;   Laterality: N/A;  . ESOPHAGOGASTRODUODENOSCOPY (EGD) WITH PROPOFOL N/A 03/25/2018   Procedure: ESOPHAGOGASTRODUODENOSCOPY (EGD) WITH PROPOFOL;  Surgeon: Toledo, Benay Pike, MD;  Location: [facility];  Service: Gastroenterology;  Laterality: N/A;  . INTRAUTERINE DEVICE INSERTION    . IUD REMOVAL  08/29/2019   Procedure: INTRAUTERINE DEVICE (IUD) REMOVAL;  Surgeon: Harlin Heys, MD;  Location: [facility];  Service: Gynecology;;  . KNEE CARTILAGE SURGERY Right 02/2015  . LAPAROSCOPIC ASSISTED VAGINAL HYSTERECTOMY N/A 08/29/2019   Procedure: LAPAROSCOPIC ASSISTED VAGINAL HYSTERECTOMY;  Surgeon: Harlin Heys, MD;  Location: [facility];  Service: Gynecology;  Laterality: N/A;  . LAPAROSCOPIC BILATERAL SALPINGO OOPHERECTOMY Bilateral 08/29/2019   Procedure: LAPAROSCOPIC BILATERAL SALPINGO OOPHORECTOMY;  Surgeon: Harlin Heys, MD;  Location: [facility];  Service: Gynecology;  Laterality: Bilateral;  . LAPAROSCOPIC TUBAL LIGATION      OB History    Gravida  4   Para  3   Term      Preterm      AB  1   Living        SAB  1   IAB      Ectopic      Multiple      Live Births           Obstetric Comments  1st Menstrual Cycle:  18 1st Pregnancy:  18          Home Medications    Prior to Admission medications   Medication Sig Start Date End Date Taking? Authorizing Provider  estradiol (ESTRACE) 1 MG tablet Take 1.5 tablets (1.5 mg total) by mouth daily. 08/20/20 08/20/21  Harlin Heys, MD  gabapentin (NEURONTIN) 100 MG capsule Take 100 mg by mouth daily as needed for pain or sleep.    [provider]  nitrofurantoin, macrocrystal-monohydrate, (MACROBID) 100 MG capsule Take 1 capsule (100 mg total) by mouth 2 (two) times daily. 08/23/20   Rodriguez-Southworth, Sunday Spillers, PA-C  ondansetron (ZOFRAN) 4 MG tablet Take 1 tablet (4 mg total) by mouth every 8 (eight) hours as needed for nausea or vomiting. 08/23/20   Rodriguez-Southworth, Sunday Spillers, PA-C  ipratropium  (ATROVENT) 0.06 % nasal spray Place 2 sprays into both nostrils 4 (four) times daily as needed for rhinitis. 05/15/20 06/26/20  Coral Spikes, DO  traZODone (DESYREL) 50 MG tablet Take 1 tablet (50 mg total) by mouth at bedtime. 06/26/20 08/23/20  Margarette Canada, NP    Family History Family History  Problem Relation Age of Onset  . Hypertension Mother   . Prostate cancer Father   . Hypertension Father   . Breast cancer Maternal Grandmother 60  . Colon cancer Neg Hx     Social History Social History   Tobacco Use  . Smoking status: Current Every Day Smoker    Packs/day: 1.25    Years: 7.00    Pack years: 8.75    Types: Cigarettes  . Smokeless tobacco: Never Used  Vaping Use  . Vaping Use: Never used  Substance Use Topics  . Alcohol use: No    Alcohol/week: 0.0 standard drinks  . Drug use: No     Allergies   Patient has no known allergies.   Review of Systems Review of Systems  Constitutional: Negative for activity change, appetite change, chills, diaphoresis, fatigue and fever.  HENT: Positive for sore throat. Negative for congestion, ear discharge, ear pain, rhinorrhea, sinus pressure and sinus pain.   Eyes: Negative for pain.  Respiratory: Positive for cough. Negative for chest tightness, shortness of breath, wheezing and stridor.   Cardiovascular: Negative for chest pain and palpitations.  Gastrointestinal: Negative for abdominal pain.  Genitourinary: Negative for dysuria, enuresis, flank pain, frequency, hematuria and urgency.  Skin: Negative for color change, pallor, rash and wound.  Neurological: Negative for dizziness, syncope, light-headedness and headaches.  All other systems reviewed and are negative.    Physical Exam Triage Vital Signs ED Triage Vitals  Enc Vitals Group     BP 10/04/20 1603 (!) 156/95     Pulse Rate 10/04/20 1603 87     Resp 10/04/20 1603 14     Temp 10/04/20 1603 98.2 F (36.8 C)     Temp Source 10/04/20 1603 Oral     SpO2 10/04/20  1603 100 %     Weight 10/04/20 1600 150 lb (68 kg)     Height 10/04/20 1600 5\' 2"  (1.575 m)     Head Circumference --      Peak Flow --      Pain Score 10/04/20 1559 0     Pain Loc --      Pain Edu? --      Excl. in [place]? --    No data found.  Updated Vital Signs BP (!) 156/95 (BP Location: Left Arm)   Pulse 87   Temp 98.2 F (  36.8 C) (Oral)   Resp 14   Ht 5\' 2"  (1.575 m)   Wt 68 kg   LMP 08/22/2019   SpO2 100%   BMI 27.44 kg/m   Visual Acuity Right Eye Distance:   Left Eye Distance:   Bilateral Distance:    Right Eye Near:   Left Eye Near:    Bilateral Near:     Physical Exam Vitals and nursing note reviewed.  Constitutional:      General: She is not in acute distress.    Appearance: Normal appearance. She is not ill-appearing or toxic-appearing.  HENT:     Head: Normocephalic and atraumatic.     Right Ear: Tympanic membrane normal.     Left Ear: Tympanic membrane normal.     Nose: Nose normal. No congestion or rhinorrhea.     Mouth/Throat:     Mouth: Mucous membranes are moist.     Pharynx: Posterior oropharyngeal erythema present. No oropharyngeal exudate.  Eyes:     Extraocular Movements: Extraocular movements intact.     Conjunctiva/sclera: Conjunctivae normal.     Pupils: Pupils are equal, round, and reactive to light.  Cardiovascular:     Rate and Rhythm: Normal rate and regular rhythm.     Pulses: Normal pulses.     Heart sounds: Normal heart sounds.  Pulmonary:     Effort: Pulmonary effort is normal. No respiratory distress.     Breath sounds: Normal breath sounds. No stridor. No wheezing, rhonchi or rales.     Comments: She is coughing throughout exam.  She wonders whether it is related to her smoking history. Musculoskeletal:     Cervical back: Normal range of motion and neck supple. No rigidity.  Lymphadenopathy:     Cervical: No cervical adenopathy.  Skin:    General: Skin is warm and dry.     Capillary Refill: Capillary refill takes less  than 2 seconds.  Neurological:     General: No focal deficit present.     Mental Status: She is alert and oriented to person, place, and time.  Psychiatric:        Mood and Affect: Mood normal.        Behavior: Behavior normal.      UC Treatments / Results  Labs (all labs ordered are listed, but only abnormal results are displayed) Labs Reviewed  URINALYSIS, COMPLETE (UACMP) WITH MICROSCOPIC - Abnormal; Notable for the following components:      Result Value   Hgb urine dipstick TRACE (*)    Bacteria, UA FEW (*)    All other components within normal limits  SARS CORONAVIRUS 2 (TAT 6-24 HRS)    EKG   Radiology No results found.  Procedures Procedures (including critical care time)  Medications Ordered in UC Medications - No data to display  Initial Impression / Assessment and Plan / UC Course  I have reviewed the triage vital signs and the nursing notes.  Pertinent labs & imaging results that were available during my care of the patient were reviewed by me and considered in my medical decision making (see chart for details).  Clinical impression: 1.  Mild tickle in her throat with cough with positive COVID exposure 2.  Recent urinary tract infection with some incomplete voiding but otherwise asymptomatic.  Concerned that she has not fully resolved her infection.  Treatment plan: 1.  The findings and treatment plan were discussed in detail with the patient.  Patient was in agreement. 2.  I reassured her  that her exam was normal and her UA was normal. 3.  Regarding her COVID symptoms and her exposure we will go ahead and swab her.  It will take 6 to 24 h for that to come back.  I will give her a work note keeping her out of work until she has a documented negative test from this test.  She needs to isolate.  If she is positive she will need to quarantine.  She will need an updated work note if she is positive.  For now we will keep her out of work tomorrow and she can  return to work on Saturday. 4.  Just supportive care, plenty of fluids, plenty of rest, Tylenol or Motrin for fever discomfort.  Over-the-counter meds as needed. 5.  Work note was provided. 6.  Follow-up here as needed.    Final Clinical Impressions(s) / UC Diagnoses   Final diagnoses:  Close exposure to COVID-19 virus  Cough  Acute pharyngitis, unspecified etiology  Urinary frequency     Discharge Instructions     I reassured her that her exam was normal and her UA was normal. Regarding her COVID symptoms and her exposure we will go ahead and swab her.  It will take 6 to 24 h for that to come back.  I will give her a work note keeping her out of work until she has a documented negative test from this test.  She needs to isolate.  If she is positive she will need to quarantine.  She will need an updated work note if she is positive.  For now we will keep her out of work tomorrow and she can return to work on Saturday. Just supportive care, plenty of fluids, plenty of rest, Tylenol or Motrin for fever discomfort.  Over-the-counter meds as needed. Work note was provided. Follow-up here as needed.    ED Prescriptions    None     PDMP not reviewed this encounter.   Verda Cumins, MD 10/04/20 1759

## 2020-10-04 NOTE — Discharge Instructions (Addendum)
I reassured her that her exam was normal and her UA was normal. Regarding her COVID symptoms and her exposure we will go ahead and swab her.  It will take 6 to 24 h for that to come back.  I will give her a work note keeping her out of work until she has a documented negative test from this test.  She needs to isolate.  If she is positive she will need to quarantine.  She will need an updated work note if she is positive.  For now we will keep her out of work tomorrow and she can return to work on Saturday. Just supportive care, plenty of fluids, plenty of rest, Tylenol or Motrin for fever discomfort.  Over-the-counter meds as needed. Work note was provided. Follow-up here as needed.

## 2020-10-05 ENCOUNTER — Other Ambulatory Visit: Payer: Medicaid Other

## 2020-10-05 LAB — SARS CORONAVIRUS 2 (TAT 6-24 HRS): SARS Coronavirus 2: NEGATIVE

## 2020-11-01 ENCOUNTER — Encounter: Payer: Medicaid Other | Admitting: Obstetrics and Gynecology

## 2020-12-17 ENCOUNTER — Encounter: Payer: Self-pay | Admitting: Emergency Medicine

## 2020-12-17 ENCOUNTER — Ambulatory Visit
Admission: EM | Admit: 2020-12-17 | Discharge: 2020-12-17 | Disposition: A | Discharge status: Home / Self Care | Payer: Medicaid Other | Attending: Internal Medicine | Admitting: Internal Medicine

## 2020-12-17 ENCOUNTER — Other Ambulatory Visit: Payer: Self-pay

## 2020-12-17 DIAGNOSIS — Z8719 Personal history of other diseases of the digestive system: Secondary | ICD-10-CM | POA: Diagnosis not present

## 2020-12-17 DIAGNOSIS — R142 Eructation: Secondary | ICD-10-CM | POA: Diagnosis not present

## 2020-12-17 MED ORDER — ALUM & MAG HYDROXIDE-SIMETH 200-200-20 MG/5ML PO SUSP
30.0000 mL | Freq: Once | ORAL | Status: AC
  Administered 2020-12-17: 30 mL via ORAL

## 2020-12-17 MED ORDER — FAMOTIDINE 20 MG PO TABS: 20.0000 mg | ORAL_TABLET | Freq: Two times a day (BID) | ORAL | 0 refills | Status: DC

## 2020-12-17 MED ORDER — LIDOCAINE VISCOUS HCL 2 % MT SOLN
15.0000 mL | Freq: Once | OROMUCOSAL | Status: AC
  Administered 2020-12-17: 15 mL via ORAL

## 2020-12-17 NOTE — Discharge Instructions (Addendum)
Please make an appointment with your primary care doctor to follow up in 1-2 weeks.

## 2020-12-17 NOTE — ED Provider Notes (Signed)
[facility]    CSN: [id number] Arrival date & time: 12/17/20  0801      History   Chief Complaint Chief Complaint  Patient presents with  . Gastroesophageal Reflux  . Diarrhea  . chest discomfort    HPI [name] is a 45 y.o. female who presents with onset of burping, reflux and chest discomfort x 2 days. She also developed loose stools after eating at [employer] 2-3 a day since, but is better today. Her mother ate the same thing and she is fine. Pt denies fever, chills, sweats, or abdominal pain. Has had gastritis in the past diagnosed via endoscopy and had similar symptoms. Admits she has been under a lot of stress.  The chest discomfort is described as burning. She does not recall if she had H-pylori.  She has not taken any meds for symptoms.  She also requests a note for work.     Past Medical History:  Diagnosis Date  . Depression   . 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 Leukocytosis   . Tobacco use     Patient Active Problem List   Diagnosis Date Noted  . Status post surgery 08/29/2019  . GI bleed 03/23/2018  . Leucocytosis 03/04/2016    Past Surgical History:  Procedure Laterality Date  . ABDOMINAL HYSTERECTOMY    . BREAST BIOPSY Right 2016   [place] core, fat necrosis  . COLONOSCOPY WITH PROPOFOL N/A 03/25/2018   Procedure: COLONOSCOPY WITH PROPOFOL;  Surgeon: Toledo, Benay Pike, MD;  Location: [facility];  Service: Gastroenterology;  Laterality: N/A;  . ESOPHAGOGASTRODUODENOSCOPY (EGD) WITH PROPOFOL N/A 03/25/2018   Procedure: ESOPHAGOGASTRODUODENOSCOPY (EGD) WITH PROPOFOL;  Surgeon: Toledo, Benay Pike, MD;  Location: [facility];  Service: Gastroenterology;  Laterality: N/A;  . INTRAUTERINE DEVICE INSERTION    . IUD REMOVAL  08/29/2019   Procedure: INTRAUTERINE DEVICE (IUD) REMOVAL;  Surgeon: Harlin Heys, MD;  Location: [facility];  Service: Gynecology;;  . KNEE CARTILAGE SURGERY Right 02/2015  . LAPAROSCOPIC  ASSISTED VAGINAL HYSTERECTOMY N/A 08/29/2019   Procedure: LAPAROSCOPIC ASSISTED VAGINAL HYSTERECTOMY;  Surgeon: Harlin Heys, MD;  Location: [facility];  Service: Gynecology;  Laterality: N/A;  . LAPAROSCOPIC BILATERAL SALPINGO OOPHERECTOMY Bilateral 08/29/2019   Procedure: LAPAROSCOPIC BILATERAL SALPINGO OOPHORECTOMY;  Surgeon: Harlin Heys, MD;  Location: [facility];  Service: Gynecology;  Laterality: Bilateral;  . LAPAROSCOPIC TUBAL LIGATION      OB History    Gravida  4   Para  3   Term      Preterm      AB  1   Living        SAB  1   IAB      Ectopic      Multiple      Live Births           Obstetric Comments  1st Menstrual Cycle:  18 1st Pregnancy:  18          Home Medications    Prior to Admission medications   Medication Sig Start Date End Date Taking? Authorizing Provider  melatonin 5 MG TABS Take 5 mg by mouth at bedtime as needed.   Yes [provider]  estradiol (ESTRACE) 1 MG tablet Take 1.5 tablets (1.5 mg total) by mouth daily. 08/20/20 08/20/21  Harlin Heys, MD  gabapentin (NEURONTIN) 100 MG capsule Take 100 mg by mouth daily as needed for pain or sleep.    [provider]  nitrofurantoin, macrocrystal-monohydrate, (MACROBID) 100 MG capsule Take 1 capsule (100 mg total) by mouth 2 (two) times daily. 08/23/20   Rodriguez-Southworth, Sunday Spillers, PA-C  ondansetron (ZOFRAN) 4 MG tablet Take 1 tablet (4 mg total) by mouth every 8 (eight) hours as needed for nausea or vomiting. 08/23/20   Rodriguez-Southworth, Sunday Spillers, PA-C  ipratropium (ATROVENT) 0.06 % nasal spray Place 2 sprays into both nostrils 4 (four) times daily as needed for rhinitis. 05/15/20 06/26/20  Coral Spikes, DO  traZODone (DESYREL) 50 MG tablet Take 1 tablet (50 mg total) by mouth at bedtime. 06/26/20 08/23/20  Margarette Canada, NP    Family History Family History  Problem Relation Age of Onset  . Hypertension Mother   . Prostate cancer Father   . Hypertension  Father   . Breast cancer Maternal Grandmother 69  . Colon cancer Neg Hx     Social History Social History   Tobacco Use  . Smoking status: Current Every Day Smoker    Packs/day: 1.00    Years: 10.00    Pack years: 10.00    Types: Cigarettes  . Smokeless tobacco: Never Used  Vaping Use  . Vaping Use: Never used  Substance Use Topics  . Alcohol use: No    Alcohol/week: 0.0 standard drinks  . Drug use: No     Allergies   Patient has no known allergies.   Review of Systems Review of Systems Denies blood in stool unless her hemorrhoids are bothering her, denies melena, vomiting, nausea, abdominal pain, chest pressure, fever, chills. Has night sweats from menopause and cant tolerate the estrogen medication she was placed on. Denies cough, rhinitis, SOB or edema.  + anxiety  Physical Exam Triage Vital Signs ED Triage Vitals  Enc Vitals Group     BP 12/17/20 0815 (!) 138/98     Pulse Rate 12/17/20 0815 99     Resp 12/17/20 0815 18     Temp 12/17/20 0815 98.4 F (36.9 C)     Temp Source 12/17/20 0815 Oral     SpO2 12/17/20 0815 99 %     Weight 12/17/20 0816 150 lb (68 kg)     Height 12/17/20 0816 5\' 2"  (1.575 m)     Head Circumference --      Peak Flow --      Pain Score 12/17/20 0816 6     Pain Loc --      Pain Edu? --      Excl. in [place]? --    No data found.  Updated Vital Signs BP (!) 138/98 (BP Location: Left Arm)   Pulse 99   Temp 98.4 F (36.9 C) (Oral)   Resp 18   Ht 5\' 2"  (1.575 m)   Wt 150 lb (68 kg)   LMP 08/22/2019   SpO2 99%   BMI 27.44 kg/m   Visual Acuity Right Eye Distance:   Left Eye Distance:   Bilateral Distance:    Right Eye Near:   Left Eye Near:    Bilateral Near:     Physical Exam Physical Exam Vitals signs and nursing note reviewed.  Constitutional:      General: She is not in acute distress who was burping the entire visit    Appearance: Normal appearance. She is not ill-appearing, toxic-appearing or diaphoretic.        Head: Normocephalic.     Eyes:     General: No scleral icterus.       Right eye: No discharge.  Left eye: No discharge.     Conjunctiva/sclera: Conjunctivae normal.  Neck:     Musculoskeletal: Neck supple. No neck rigidity.  Cardiovascular:     Rate and Rhythm: Normal rate and regular rhythm.     Heart sounds: No murmur.  Pulmonary:     Effort: Pulmonary effort is normal.     Breath sounds: Normal breath sounds.  Abdominal:     General: Bowel sounds are normal. There is no distension.     Palpations: Abdomen is soft. There is no mass.     Tenderness: There is mild abdominal tenderness on RUP and epigastrium. There is no guarding or rebound.     Hernia: No hernia is present.  Musculoskeletal: Normal range of motion.  Lymphadenopathy:     Cervical: No cervical adenopathy.  Skin:    General: Skin is warm and dry.     Coloration: Skin is not jaundiced.     Findings: No rash.  Neurological:     Mental Status: She is alert and oriented to person, place, and time.     Gait: Gait normal.  Psychiatric:        Mood and Affect: Mood normal.        Behavior: Behavior normal.        Thought Content: Thought content normal.        Judgment: Judgment normal.     UC Treatments / Results  Labs (all labs ordered are listed, but only abnormal results are displayed) Labs Reviewed - No data to display  EKG  Sinus rhythm with PVC's  Radiology No results found.  Procedures Procedures (including critical care time)  Medications Ordered in UC Medications - No data to display  Initial Impression / Assessment and Plan / UC Course  I have reviewed the triage vital signs and the nursing notes. Pertinent  imaging results that were available during my care of the patient were reviewed by me and considered in my medical decision making (see chart for details). She was given GI cocktail and her symptoms improved including the chest dyscomfort and stopped burping.  I reviewed her  endoscopy report and had mild inflammation in the antral gastric region. I could not find the H-pylory results of the biopsy that was done. I placed her on Pepsid 20 mg bid and advised to Fu with PCP. If symptoms were not improved may need to have her gallbladder checked if she still tender on this area as well.  Final Clinical Impressions(s) / UC Diagnoses   Final diagnoses:  None   Discharge Instructions   None    ED Prescriptions    None     PDMP not reviewed this encounter.   Shelby Mattocks, [place] 12/17/20 [phone]

## 2020-12-17 NOTE — ED Triage Notes (Signed)
Patient in today c/o reflux, burping and chest discomfort x 2 days, worse today. Patient also c/o diarrhea x 2 days and states she thinks it is something she ate. Patient has not taken any OTC medications for her symptoms.

## 2020-12-27 DIAGNOSIS — E8940 Asymptomatic postprocedural ovarian failure: Secondary | ICD-10-CM | POA: Insufficient documentation

## 2021-03-26 ENCOUNTER — Encounter: Payer: Self-pay | Admitting: Obstetrics and Gynecology

## 2021-03-26 ENCOUNTER — Ambulatory Visit (INDEPENDENT_AMBULATORY_CARE_PROVIDER_SITE_OTHER): Payer: Medicaid Other | Admitting: Obstetrics and Gynecology

## 2021-03-26 ENCOUNTER — Other Ambulatory Visit: Payer: Self-pay

## 2021-03-26 VITALS — BP 168/103 | HR 94 | Ht 62.0 in | Wt 160.7 lb

## 2021-03-26 DIAGNOSIS — Z79890 Hormone replacement therapy (postmenopausal): Secondary | ICD-10-CM

## 2021-03-26 DIAGNOSIS — I10 Essential (primary) hypertension: Secondary | ICD-10-CM | POA: Diagnosis not present

## 2021-03-26 DIAGNOSIS — Z720 Tobacco use: Secondary | ICD-10-CM

## 2021-03-26 DIAGNOSIS — N951 Menopausal and female climacteric states: Secondary | ICD-10-CM

## 2021-03-26 DIAGNOSIS — R351 Nocturia: Secondary | ICD-10-CM | POA: Diagnosis not present

## 2021-03-26 LAB — POCT URINALYSIS DIPSTICK OB
Bilirubin, UA: NEGATIVE
Blood, UA: NEGATIVE
Glucose, UA: NEGATIVE
Ketones, UA: NEGATIVE
Leukocytes, UA: NEGATIVE
Nitrite, UA: NEGATIVE
POC,PROTEIN,UA: NEGATIVE
Spec Grav, UA: <=1.005 — AB (ref 1.010–1.025)
Urobilinogen, UA: 0.2 E.U./dL
pH, UA: 6.5 (ref 5.0–8.0)

## 2021-03-26 MED ORDER — VARENICLINE TARTRATE 1 MG PO TABS: 1.0000 mg | ORAL_TABLET | Freq: Two times a day (BID) | ORAL | 1 refills | Status: DC

## 2021-03-26 MED ORDER — ESTRADIOL 1 MG PO TABS: 1.5000 mg | ORAL_TABLET | Freq: Every day | ORAL | 0 refills | Status: DC

## 2021-03-26 MED ORDER — VARENICLINE TARTRATE 0.5 MG X 11 & 1 MG X 42 PO MISC: ORAL | 0 refills | Status: DC

## 2021-03-26 NOTE — Progress Notes (Signed)
HPI:      [name] is a 45 y.o. G4P0010 who LMP was Patient's last menstrual period was 08/22/2019.  Subjective:   She presents today to follow-up use of ERT.  Patient states that she stopped taking her medication and then restarted at 1 mg instead of the prescribed 1.5.  She states that she is not having any hot flashes or vaginal dryness but that she has noticed that she seems to be more moody. She has a desire to discontinue tobacco use and she would like to try medication to help her.     Hx: The following portions of the patient's history were reviewed and updated as appropriate:             She  has a past medical history of Depression, Focal nodular hyperplasia determined by liver biopsy, Leukocytosis, and Tobacco use. She does not have any pertinent problems on file. She  has a past surgical history that includes Knee cartilage surgery (Right, 02/2015); Laparoscopic tubal ligation; Breast biopsy (Right, 2016); Esophagogastroduodenoscopy (egd) with propofol (N/A, 03/25/2018); Colonoscopy with propofol (N/A, 03/25/2018); Intrauterine device insertion; Laparoscopic assisted vaginal hysterectomy (N/A, 08/29/2019); Laparoscopic bilateral salpingo oophorectomy (Bilateral, 08/29/2019); IUD removal (08/29/2019); and Abdominal hysterectomy. Her family history includes Breast cancer (age of onset: 42) in her maternal grandmother; Hypertension in her father and mother; Prostate cancer in her father. She  reports that she has been smoking cigarettes. She has a 10.00 pack-year smoking history. She has never used smokeless tobacco. She reports that she does not drink alcohol and does not use drugs. She has a current medication list which includes the following prescription(s): estradiol, famotidine, varenicline, varenicline, gabapentin, melatonin, [DISCONTINUED] ipratropium, and [DISCONTINUED] trazodone. She has No Known Allergies.       Review of Systems:  Review of Systems  Constitutional:  Denied constitutional symptoms, night sweats, recent illness, fatigue, fever, insomnia and weight loss.  Eyes: Denied eye symptoms, eye pain, photophobia, vision change and visual disturbance.  Ears/Nose/Throat/Neck: Denied ear, nose, throat or neck symptoms, hearing loss, nasal discharge, sinus congestion and sore throat.  Cardiovascular: Denied cardiovascular symptoms, arrhythmia, chest pain/pressure, edema, exercise intolerance, orthopnea and palpitations.  Respiratory: Denied pulmonary symptoms, asthma, pleuritic pain, productive sputum, cough, dyspnea and wheezing.  Gastrointestinal: Denied, gastro-esophageal reflux, melena, nausea and vomiting.  Genitourinary: Denied genitourinary symptoms including symptomatic vaginal discharge, pelvic relaxation issues, and urinary complaints.  Musculoskeletal: Denied musculoskeletal symptoms, stiffness, swelling, muscle weakness and myalgia.  Dermatologic: Denied dermatology symptoms, rash and scar.  Neurologic: Denied neurology symptoms, dizziness, headache, neck pain and syncope.  Psychiatric: Denied psychiatric symptoms, anxiety and depression.  Endocrine: See HPI for additional information.   Meds:   Current Outpatient Medications on File Prior to Visit  Medication Sig Dispense Refill   famotidine (PEPCID) 20 MG tablet Take 1 tablet (20 mg total) by mouth 2 (two) times daily. 60 tablet 0   gabapentin (NEURONTIN) 300 MG capsule Take by mouth.     melatonin 5 MG TABS Take 5 mg by mouth at bedtime as needed. (Patient not taking: Reported on 03/26/2021)     [DISCONTINUED] ipratropium (ATROVENT) 0.06 % nasal spray Place 2 sprays into both nostrils 4 (four) times daily as needed for rhinitis. 15 mL 0   [DISCONTINUED] traZODone (DESYREL) 50 MG tablet Take 1 tablet (50 mg total) by mouth at bedtime. 30 tablet 1   No current facility-administered medications on file prior to visit.          Objective:  Vitals:   03/26/21 1538  BP: (!) 168/103   Pulse: 94   Filed Weights   03/26/21 1538  Weight: 160 lb 11.2 oz (72.9 kg)              Repeat blood pressure is 142/88  Assessment:    G4P0010 Patient Active Problem List   Diagnosis Date Noted   Status post surgery 08/29/2019   GI bleed 03/23/2018   Leucocytosis 03/04/2016     1. Postmenopausal hormone therapy   2. Symptomatic menopausal or female climacteric states   3. Tobacco abuse disorder     Patient came in with elevated blood pressure but throughout her visit it has declined.  She has a desire to discontinue smoking  She would like to continue her Estrace but would like to try 1.5 mg and see if it improves her mood issues.   Plan:            1.  Increase Estrace to 1.5 mg for a trial  2.  Chantix prescribed.  Taper regimen to discontinue smoking discussed.  3.  Blood pressure issues discussed.  Patient to check her blood pressure several times in the next 2 weeks if it remains elevated she has plans to see her family physician for possible medication.  Orders No orders of the defined types were placed in this encounter.    Meds ordered this encounter  Medications   varenicline (CHANTIX STARTING MONTH PAK) 0.5 MG X 11 & 1 MG X 42 tablet    Sig: Take one 0.5 mg tablet by mouth once daily for 3 days, then increase to one 0.5 mg tablet twice daily for 4 days, then increase to one 1 mg tablet twice daily.    Dispense:  53 tablet    Refill:  0   varenicline (CHANTIX CONTINUING MONTH PAK) 1 MG tablet    Sig: Take 1 tablet (1 mg total) by mouth 2 (two) times daily.    Dispense:  60 tablet    Refill:  1   estradiol (ESTRACE) 1 MG tablet    Sig: Take 1.5 tablets (1.5 mg total) by mouth daily.    Dispense:  135 tablet    Refill:  0      F/U  Return in about 3 months (around 06/26/2021) for Annual Physical. I spent 31 minutes involved in the care of this patient preparing to see the patient by obtaining and reviewing her medical history (including labs, imaging  tests and prior procedures), documenting clinical information in the electronic health record (EHR), counseling and coordinating care plans, writing and sending prescriptions, ordering tests or procedures and directly communicating with the patient by discussing pertinent items from her history and physical exam as well as detailing my assessment and plan as noted above so that she has an informed understanding.  All of her questions were answered.  Finis Bud, M.D. 03/26/2021 4:05 PM

## 2021-04-02 ENCOUNTER — Other Ambulatory Visit: Payer: Self-pay | Admitting: Obstetrics and Gynecology

## 2021-04-02 DIAGNOSIS — Z720 Tobacco use: Secondary | ICD-10-CM

## 2021-04-02 MED ORDER — BUPROPION HCL ER (SR) 150 MG PO TB12: 150.0000 mg | ORAL_TABLET | Freq: Two times a day (BID) | ORAL | 2 refills | Status: DC

## 2021-04-02 NOTE — Progress Notes (Signed)
Your doctor or other healthcare provider will help you decide when to start taking bupropion. In general, most people start taking bupropion about 1-2 weeks before their quit date. Your doctor or other healthcare provider will give you dosing instructions. Most people are started on a lower dose at first to reduce the risk of certain side effects:      Days 1 to 3: Take 1 tablet (150 mg) each day.     Days 3 until the end of treatment: Take two tablets (150 mg each) per day - one in the morning and one in the evening, at least 8 hours apart.  Try to take bupropion at the same time every day. The two doses should always be separated by at least eight hours. Do not take more than two tablets per day. Bupropion can be taken with or without food. Do not crush, chew, or divide bupropion tablets. If you miss a dose of bupropion, take it as soon as you remember, and take the missed dose for that day at least eight hours later. If it is almost time for the next dose, skip the dose you forgot and continue your regular dosing schedule. It is very important that you have at least 8 hours between doses, and that you do not exceed two doses in a 24-hour period. Most people take bupropion SR for 12 weeks.

## 2021-06-20 ENCOUNTER — Encounter: Payer: Self-pay | Admitting: General Surgery

## 2021-06-26 ENCOUNTER — Other Ambulatory Visit: Payer: Self-pay

## 2021-06-26 ENCOUNTER — Ambulatory Visit
Admission: EM | Admit: 2021-06-26 | Discharge: 2021-06-26 | Disposition: A | Discharge status: Home / Self Care | Payer: Medicaid Other | Attending: Family Medicine | Admitting: Family Medicine

## 2021-06-26 DIAGNOSIS — Z20822 Contact with and (suspected) exposure to covid-19: Secondary | ICD-10-CM | POA: Insufficient documentation

## 2021-06-26 DIAGNOSIS — J069 Acute upper respiratory infection, unspecified: Secondary | ICD-10-CM | POA: Diagnosis present

## 2021-06-26 MED ORDER — CETIRIZINE-PSEUDOEPHEDRINE ER 5-120 MG PO TB12: 1.0000 | ORAL_TABLET | Freq: Two times a day (BID) | ORAL | 0 refills | Status: DC | PRN

## 2021-06-26 NOTE — ED Triage Notes (Signed)
Pt c/o COVID exposure through her daughter who tested positive yesterday. Pt reports some nasal congestion, light-headed, dizzy and states things "taste bad". Pt is requesting a COVID test since she works in [employer], [employer]. Pt denies f/n/v/d or other symptoms.

## 2021-06-26 NOTE — ED Provider Notes (Signed)
[facility]    CSN: [id number] Arrival date & time: 06/26/21  1256      History   Chief Complaint Chief Complaint  Patient presents with   Nasal Congestion   Dizziness    HPI 45 year old female presents with respiratory symptoms.  Patient reports that she has had recent COVID exposure.  Both of her children have COVID-19.  She states that she has had some ear pain as well as congestion.  She states that she has been lightheaded and dizzy.  She states that she has some altered taste.  No fever.  No relieving factors.  She is concerned that she may have COVID-19.  Desires testing today.  Past Medical History:  Diagnosis Date   Depression    Focal nodular hyperplasia determined by liver biopsy    Leukocytosis    Tobacco use     Patient Active Problem List   Diagnosis Date Noted   Status post surgery 08/29/2019   GI bleed 03/23/2018   Leucocytosis 03/04/2016    Past Surgical History:  Procedure Laterality Date   ABDOMINAL HYSTERECTOMY     BREAST BIOPSY Right 2016   [place] core, fat necrosis   COLONOSCOPY WITH PROPOFOL N/A 03/25/2018   Procedure: COLONOSCOPY WITH PROPOFOL;  Surgeon: Toledo, Benay Pike, MD;  Location: [facility];  Service: Gastroenterology;  Laterality: N/A;   ESOPHAGOGASTRODUODENOSCOPY (EGD) WITH PROPOFOL N/A 03/25/2018   Procedure: ESOPHAGOGASTRODUODENOSCOPY (EGD) WITH PROPOFOL;  Surgeon: Toledo, Benay Pike, MD;  Location: [facility];  Service: Gastroenterology;  Laterality: N/A;   INTRAUTERINE DEVICE INSERTION     IUD REMOVAL  08/29/2019   Procedure: INTRAUTERINE DEVICE (IUD) REMOVAL;  Surgeon: Harlin Heys, MD;  Location: [facility];  Service: Gynecology;;   KNEE CARTILAGE SURGERY Right 02/2015   LAPAROSCOPIC ASSISTED VAGINAL HYSTERECTOMY N/A 08/29/2019   Procedure: LAPAROSCOPIC ASSISTED VAGINAL HYSTERECTOMY;  Surgeon: Harlin Heys, MD;  Location: [facility];  Service: Gynecology;  Laterality: N/A;   LAPAROSCOPIC BILATERAL SALPINGO  OOPHERECTOMY Bilateral 08/29/2019   Procedure: LAPAROSCOPIC BILATERAL SALPINGO OOPHORECTOMY;  Surgeon: Harlin Heys, MD;  Location: [facility];  Service: Gynecology;  Laterality: Bilateral;   LAPAROSCOPIC TUBAL LIGATION      OB History     Gravida  4   Para  3   Term      Preterm      AB  1   Living         SAB  1   IAB      Ectopic      Multiple      Live Births           Obstetric Comments  1st Menstrual Cycle:  18 1st Pregnancy:  18           Home Medications    Prior to Admission medications   Medication Sig Start Date End Date Taking? Authorizing Provider  cetirizine-pseudoephedrine (ZYRTEC-D) 5-120 MG tablet Take 1 tablet by mouth 2 (two) times daily as needed for rhinitis (Congestion). 06/26/21  Yes Julina Altmann G, DO  estradiol (ESTRACE) 1 MG tablet Take 1.5 tablets (1.5 mg total) by mouth daily. 03/26/21 03/26/22 Yes Harlin Heys, MD  gabapentin (NEURONTIN) 300 MG capsule Take by mouth. 03/13/21  Yes [provider]  famotidine (PEPCID) 20 MG tablet Take 1 tablet (20 mg total) by mouth 2 (two) times daily. 12/17/20   Rodriguez-Southworth, Sunday Spillers, PA-C  melatonin 5 MG TABS Take 5 mg by mouth at bedtime as needed. Patient not taking:  Reported on 03/26/2021    [provider]  ipratropium (ATROVENT) 0.06 % nasal spray Place 2 sprays into both nostrils 4 (four) times daily as needed for rhinitis. 05/15/20 06/26/20  Coral Spikes, DO  traZODone (DESYREL) 50 MG tablet Take 1 tablet (50 mg total) by mouth at bedtime. 06/26/20 08/23/20  Margarette Canada, NP    Family History Family History  Problem Relation Age of Onset   Hypertension Mother    Prostate cancer Father    Hypertension Father    Breast cancer Maternal Grandmother 31   Colon cancer Neg Hx     Social History Social History   Tobacco Use   Smoking status: Every Day    Packs/day: 1.00    Years: 10.00    Pack years: 10.00    Types: Cigarettes   Smokeless tobacco: Never   Vaping Use   Vaping Use: Never used  Substance Use Topics   Alcohol use: No    Alcohol/week: 0.0 standard drinks   Drug use: No     Allergies   Patient has no known allergies.   Review of Systems Review of Systems Per HPI  Physical Exam Triage Vital Signs ED Triage Vitals  Enc Vitals Group     BP 06/26/21 1402 (!) 152/93     Pulse Rate 06/26/21 1402 96     Resp 06/26/21 1402 18     Temp 06/26/21 1402 98.5 F (36.9 C)     Temp Source 06/26/21 1402 Oral     SpO2 06/26/21 1402 100 %     Weight 06/26/21 1359 170 lb (77.1 kg)     Height 06/26/21 1359 5\' 2"  (1.575 m)     Head Circumference --      Peak Flow --      Pain Score 06/26/21 1359 0     Pain Loc --      Pain Edu? --      Excl. in [place]? --    Updated Vital Signs BP (!) 152/93 (BP Location: Left Arm)   Pulse 96   Temp 98.5 F (36.9 C) (Oral)   Resp 18   Ht 5\' 2"  (1.575 m)   Wt 77.1 kg   LMP 08/22/2019   SpO2 100%   BMI 31.09 kg/m   Visual Acuity Right Eye Distance:   Left Eye Distance:   Bilateral Distance:    Right Eye Near:   Left Eye Near:    Bilateral Near:     Physical Exam Vitals and nursing note reviewed.  Constitutional:      General: She is not in acute distress.    Appearance: Normal appearance. She is not ill-appearing.  HENT:     Head: Normocephalic and atraumatic.     Right Ear: Tympanic membrane normal.     Left Ear: Tympanic membrane normal.     Nose: Nose normal.     Mouth/Throat:     Pharynx: Oropharynx is clear. No oropharyngeal exudate or posterior oropharyngeal erythema.  Eyes:     General:        Right eye: No discharge.        Left eye: No discharge.     Conjunctiva/sclera: Conjunctivae normal.  Cardiovascular:     Rate and Rhythm: Normal rate and regular rhythm.     Heart sounds: No murmur heard. Pulmonary:     Effort: Pulmonary effort is normal. No respiratory distress.     Breath sounds: Normal breath sounds.  Neurological:  Mental Status: She is alert.   Psychiatric:        Mood and Affect: Mood normal.        Behavior: Behavior normal.     UC Treatments / Results  Labs (all labs ordered are listed, but only abnormal results are displayed) Labs Reviewed  SARS CORONAVIRUS 2 (TAT 6-24 HRS)    EKG   Radiology No results found.  Procedures Procedures (including critical care time)  Medications Ordered in UC Medications - No data to display  Initial Impression / Assessment and Plan / UC Course  I have reviewed the triage vital signs and the nursing notes.  Pertinent labs & imaging results that were available during my care of the patient were reviewed by me and considered in my medical decision making (see chart for details).    45 year old female presents with respiratory infection.  Likely viral.  Possible COVID-19 given exposure.  Awaiting test results.  Zyrtec-D as prescribed.  Supportive care.  Final Clinical Impressions(s) / UC Diagnoses   Final diagnoses:  Upper respiratory tract infection, unspecified type  Exposure to COVID-19 virus   Discharge Instructions   None    ED Prescriptions     Medication Sig Dispense Auth. Provider   cetirizine-pseudoephedrine (ZYRTEC-D) 5-120 MG tablet Take 1 tablet by mouth 2 (two) times daily as needed for rhinitis (Congestion). 30 tablet Coral Spikes, DO      PDMP not reviewed this encounter.   Coral Spikes, [place] 06/26/21 1530

## 2021-06-27 ENCOUNTER — Encounter: Payer: Medicaid Other | Admitting: Obstetrics and Gynecology

## 2021-06-27 LAB — SARS CORONAVIRUS 2 (TAT 6-24 HRS): SARS Coronavirus 2: NEGATIVE

## 2021-07-24 DIAGNOSIS — F418 Other specified anxiety disorders: Secondary | ICD-10-CM | POA: Insufficient documentation

## 2021-10-09 ENCOUNTER — Ambulatory Visit
Admission: EM | Admit: 2021-10-09 | Discharge: 2021-10-09 | Disposition: A | Discharge status: Home / Self Care | Payer: Medicaid Other | Attending: Emergency Medicine | Admitting: Emergency Medicine

## 2021-10-09 ENCOUNTER — Other Ambulatory Visit: Payer: Self-pay

## 2021-10-09 DIAGNOSIS — R051 Acute cough: Secondary | ICD-10-CM | POA: Diagnosis not present

## 2021-10-09 DIAGNOSIS — R0981 Nasal congestion: Secondary | ICD-10-CM | POA: Diagnosis not present

## 2021-10-09 DIAGNOSIS — U071 COVID-19: Secondary | ICD-10-CM

## 2021-10-09 MED ORDER — PREDNISONE 10 MG (21) PO TBPK: ORAL_TABLET | Freq: Every day | ORAL | 0 refills | Status: DC

## 2021-10-09 MED ORDER — FLUTICASONE PROPIONATE 50 MCG/ACT NA SUSP: 1.0000 | Freq: Every day | NASAL | 2 refills | Status: DC

## 2021-10-09 MED ORDER — ALBUTEROL SULFATE HFA 108 (90 BASE) MCG/ACT IN AERS: 1.0000 | INHALATION_SPRAY | Freq: Four times a day (QID) | RESPIRATORY_TRACT | 0 refills | Status: DC | PRN

## 2021-10-09 MED ORDER — DEXTROMETHORPHAN HBR 15 MG/5ML PO SYRP: 10.0000 mL | ORAL_SOLUTION | Freq: Four times a day (QID) | ORAL | 0 refills | Status: DC | PRN

## 2021-10-09 NOTE — ED Provider Notes (Signed)
[facility]    CSN: [id number] Arrival date & time: 10/09/21  5027      History   Chief Complaint Chief Complaint  Patient presents with   Cough   Nasal Congestion         HPI [name] is a 46 y.o. female.   Patient is here for cough congestion nasal pressure for the past 4 days.  Has been tested positive for COVID at home.  Patient states that she has taken several test for COVID and it was negative.  Has taken NyQuil at home with minimal relief.  Denies any chest pain no shortness of breath.  Denies any fevers   Past Medical History:  Diagnosis Date   Depression    Focal nodular hyperplasia determined by liver biopsy    Leukocytosis    Tobacco use     Patient Active Problem List   Diagnosis Date Noted   Status post surgery 08/29/2019   GI bleed 03/23/2018   Leucocytosis 03/04/2016    Past Surgical History:  Procedure Laterality Date   ABDOMINAL HYSTERECTOMY     BREAST BIOPSY Right 2016   [place] core, fat necrosis   COLONOSCOPY WITH PROPOFOL N/A 03/25/2018   Procedure: COLONOSCOPY WITH PROPOFOL;  Surgeon: Toledo, Benay Pike, MD;  Location: [facility];  Service: Gastroenterology;  Laterality: N/A;   ESOPHAGOGASTRODUODENOSCOPY (EGD) WITH PROPOFOL N/A 03/25/2018   Procedure: ESOPHAGOGASTRODUODENOSCOPY (EGD) WITH PROPOFOL;  Surgeon: Toledo, Benay Pike, MD;  Location: [facility];  Service: Gastroenterology;  Laterality: N/A;   INTRAUTERINE DEVICE INSERTION     IUD REMOVAL  08/29/2019   Procedure: INTRAUTERINE DEVICE (IUD) REMOVAL;  Surgeon: Harlin Heys, MD;  Location: [facility];  Service: Gynecology;;   KNEE CARTILAGE SURGERY Right 02/2015   LAPAROSCOPIC ASSISTED VAGINAL HYSTERECTOMY N/A 08/29/2019   Procedure: LAPAROSCOPIC ASSISTED VAGINAL HYSTERECTOMY;  Surgeon: Harlin Heys, MD;  Location: [facility];  Service: Gynecology;  Laterality: N/A;   LAPAROSCOPIC BILATERAL SALPINGO OOPHERECTOMY Bilateral 08/29/2019   Procedure: LAPAROSCOPIC  BILATERAL SALPINGO OOPHORECTOMY;  Surgeon: Harlin Heys, MD;  Location: [facility];  Service: Gynecology;  Laterality: Bilateral;   LAPAROSCOPIC TUBAL LIGATION      OB History     Gravida  4   Para  3   Term      Preterm      AB  1   Living         SAB  1   IAB      Ectopic      Multiple      Live Births           Obstetric Comments  1st Menstrual Cycle:  18 1st Pregnancy:  18           Home Medications    Prior to Admission medications   Medication Sig Start Date End Date Taking? Authorizing Provider  albuterol (VENTOLIN HFA) 108 (90 Base) MCG/ACT inhaler Inhale 1-2 puffs into the lungs every 6 (six) hours as needed for wheezing or shortness of breath. 10/09/21  Yes Marney Setting, NP  dextromethorphan 15 MG/5ML syrup Take 10 mLs (30 mg total) by mouth 4 (four) times daily as needed for cough. 10/09/21  Yes Marney Setting, NP  fluticasone (FLONASE) 50 MCG/ACT nasal spray Place 1 spray into both nostrils daily. 10/09/21  Yes Marney Setting, NP  predniSONE (STERAPRED UNI-PAK 21 TAB) 10 MG (21) TBPK tablet Take by mouth daily. Take 6 tabs by mouth daily  for  2 days, then 5 tabs for 2 days, then 4 tabs for 2 days, then 3 tabs for 2 days, 2 tabs for 2 days, then 1 tab by mouth daily for 2 days 10/09/21  Yes Marney Setting, NP  cetirizine-pseudoephedrine (ZYRTEC-D) 5-120 MG tablet Take 1 tablet by mouth 2 (two) times daily as needed for rhinitis (Congestion). 06/26/21   Coral Spikes, DO  estradiol (ESTRACE) 1 MG tablet Take 1.5 tablets (1.5 mg total) by mouth daily. 03/26/21 03/26/22  Harlin Heys, MD  famotidine (PEPCID) 20 MG tablet Take 1 tablet (20 mg total) by mouth 2 (two) times daily. 12/17/20   Rodriguez-Southworth, Sunday Spillers, PA-C  gabapentin (NEURONTIN) 300 MG capsule Take by mouth. 03/13/21   [provider]  melatonin 5 MG TABS Take 5 mg by mouth at bedtime as needed. Patient not taking: Reported on 03/26/2021    [provider]  ipratropium (ATROVENT) 0.06 % nasal spray Place 2 sprays into both nostrils 4 (four) times daily as needed for rhinitis. 05/15/20 06/26/20  Coral Spikes, DO  traZODone (DESYREL) 50 MG tablet Take 1 tablet (50 mg total) by mouth at bedtime. 06/26/20 08/23/20  Margarette Canada, NP    Family History Family History  Problem Relation Age of Onset   Hypertension Mother    Prostate cancer Father    Hypertension Father    Breast cancer Maternal Grandmother 69   Colon cancer Neg Hx     Social History Social History   Tobacco Use   Smoking status: Every Day    Packs/day: 1.00    Years: 10.00    Pack years: 10.00    Types: Cigarettes   Smokeless tobacco: Never  Vaping Use   Vaping Use: Never used  Substance Use Topics   Alcohol use: No    Alcohol/week: 0.0 standard drinks   Drug use: No     Allergies   Patient has no known allergies.   Review of Systems Review of Systems  Constitutional:  Negative for chills and fever.  HENT:  Positive for congestion, postnasal drip, rhinorrhea, sinus pressure, sinus pain and sore throat. Negative for ear discharge and ear pain.   Respiratory:  Positive for cough. Negative for shortness of breath.   Cardiovascular: Negative.   Gastrointestinal: Negative.   Genitourinary: Negative.   Skin: Negative.   Neurological: Negative.  Negative for dizziness and weakness.    Physical Exam Triage Vital Signs ED Triage Vitals  Enc Vitals Group     BP 10/09/21 0823 (!) 151/106     Pulse Rate 10/09/21 0823 (!) 106     Resp 10/09/21 0823 18     Temp 10/09/21 0823 99 F (37.2 C)     Temp Source 10/09/21 0823 Oral     SpO2 10/09/21 0823 100 %     Weight --      Height --      Head Circumference --      Peak Flow --      Pain Score 10/09/21 0822 5     Pain Loc --      Pain Edu? --      Excl. in [place]? --    No data found.  Updated Vital Signs BP (!) 151/106 (BP Location: Right Arm)    Pulse (!) 106    Temp 99 F (37.2 C) (Oral)     Resp 18    LMP 08/22/2019    SpO2 100%   Visual Acuity Right Eye Distance:  Left Eye Distance:   Bilateral Distance:    Right Eye Near:   Left Eye Near:    Bilateral Near:     Physical Exam Constitutional:      Appearance: Normal appearance.  HENT:     Right Ear: Tympanic membrane normal.     Left Ear: Tympanic membrane normal.     Nose: Congestion and rhinorrhea present.     Mouth/Throat:     Pharynx: Posterior oropharyngeal erythema present.  Eyes:     Pupils: Pupils are equal, round, and reactive to light.  Cardiovascular:     Rate and Rhythm: Tachycardia present.  Pulmonary:     Effort: Pulmonary effort is normal.     Breath sounds: Normal breath sounds.  Abdominal:     General: Abdomen is flat.  Skin:    General: Skin is warm.  Neurological:     General: No focal deficit present.     Mental Status: She is alert.     UC Treatments / Results  Labs (all labs ordered are listed, but only abnormal results are displayed) Labs Reviewed - No data to display  EKG   Radiology No results found.  Procedures Procedures (including critical care time)  Medications Ordered in UC Medications - No data to display  Initial Impression / Assessment and Plan / UC Course  I have reviewed the triage vital signs and the nursing notes.  Pertinent labs & imaging results that were available during my care of the patient were reviewed by me and considered in my medical decision making (see chart for details).     Continue to take over-the-counter cough and cold medicine as needed Can take Tylenol and Motrin as needed for pain or fever Expressed that cough can continue for several weeks take cough medicine as needed cautious with driving some of these medications can make you sleepy. Can use a humidifier as needed at nighttime to help with the cough. If symptoms become worse you need to be seen in the emergency room Final Clinical Impressions(s) / UC Diagnoses   Final  diagnoses:  Acute cough  COVID  Nasal congestion     Discharge Instructions      Continue to take over-the-counter cough and cold medicine as needed Can take Tylenol and Motrin as needed for pain or fever Expressed that cough can continue for several weeks take cough medicine as needed cautious with driving some of these medications can make you sleepy. Can use a humidifier as needed at nighttime to help with the cough. If symptoms become worse you need to be seen in the emergency room     ED Prescriptions     Medication Sig Dispense Auth. Provider   fluticasone (FLONASE) 50 MCG/ACT nasal spray Place 1 spray into both nostrils daily. 16 g Morley Kos L, NP   predniSONE (STERAPRED UNI-PAK 21 TAB) 10 MG (21) TBPK tablet Take by mouth daily. Take 6 tabs by mouth daily  for 2 days, then 5 tabs for 2 days, then 4 tabs for 2 days, then 3 tabs for 2 days, 2 tabs for 2 days, then 1 tab by mouth daily for 2 days 42 tablet Morley Kos L, NP   albuterol (VENTOLIN HFA) 108 (90 Base) MCG/ACT inhaler Inhale 1-2 puffs into the lungs every 6 (six) hours as needed for wheezing or shortness of breath. 18 g Marney Setting, NP   dextromethorphan 15 MG/5ML syrup Take 10 mLs (30 mg total) by mouth 4 (four) times daily  as needed for cough. 120 mL Marney Setting, NP      PDMP not reviewed this encounter.   Marney Setting, NP 10/09/21 [phone]

## 2021-10-09 NOTE — ED Triage Notes (Signed)
Pt reports congestion, back pain and cough x 3 days; diarrhea started today. Pt reports husband has COVID, today si last day of quarantine.   Pt reports 3 negative COVID test (home).

## 2021-10-09 NOTE — Discharge Instructions (Addendum)
Continue to take over-the-counter cough and cold medicine as needed Can take Tylenol and Motrin as needed for pain or fever Expressed that cough can continue for several weeks take cough medicine as needed cautious with driving some of these medications can make you sleepy. Can use a humidifier as needed at nighttime to help with the cough. If symptoms become worse you need to be seen in the emergency room

## 2021-10-11 ENCOUNTER — Telehealth: Payer: Medicaid Other | Admitting: Emergency Medicine

## 2021-10-11 ENCOUNTER — Telehealth: Payer: Self-pay | Admitting: Medical Oncology

## 2021-10-11 DIAGNOSIS — U071 COVID-19: Secondary | ICD-10-CM | POA: Diagnosis not present

## 2021-10-11 MED ORDER — MOLNUPIRAVIR EUA 200MG CAPSULE: 4.0000 | ORAL_CAPSULE | Freq: Two times a day (BID) | ORAL | 0 refills | Status: AC

## 2021-10-11 NOTE — Telephone Encounter (Signed)
Received a call from [place] stating that her medication is not working and that she would like a different medication.   Called and spoke to [place] and she states that she has taken 3 doses of the medication and that she does not feel like it is helping from her visit on 10/09/2021. Pt was given prednisone 10mg  tabs. Pt states that she has completed doses of the medication and is worried that the medication is causing her symptoms to continue. Pt states that she is not getting better and is having continued congestion.  Spoke with Nelwyn Salisbury, [place], and she recommended that the patient be re-evaluated for her symptoms as she states that she is getting worse as she may need a chest X-Ray. Patient was recalled and she states that she is sinus congested and light headed. Pt verbalized understanding to needing to be re-evaluated and had no further questions.

## 2021-10-11 NOTE — Progress Notes (Signed)
Virtual Visit Consent   [name], you are scheduled for a virtual visit with a [facility] provider today.     Just as with appointments in the office, your consent must be obtained to participate.  Your consent will be active for this visit and any virtual visit you may have with one of our providers in the next 365 days.     If you have a MyChart account, a copy of this consent can be sent to you electronically.  All virtual visits are billed to your insurance company just like a traditional visit in the office.    As this is a virtual visit, video technology does not allow for your provider to perform a traditional examination.  This may limit your provider's ability to fully assess your condition.  If your provider identifies any concerns that need to be evaluated in person or the need to arrange testing (such as labs, EKG, etc.), we will make arrangements to do so.     Although advances in technology are sophisticated, we cannot ensure that it will always work on either your end or our end.  If the connection with a video visit is poor, the visit may have to be switched to a telephone visit.  With either a video or telephone visit, we are not always able to ensure that we have a secure connection.     I need to obtain your verbal consent now.   Are you willing to proceed with your visit today?    Garry Bochicchio has provided verbal consent on 10/11/2021 for a virtual visit video.   [name], PA-C   Date: 10/11/2021 1:36 PM   Virtual Visit via Video Note   I, [name], connected with  [name]  ([id number], 1976-07-03) on 10/11/21 at  1:45 PM EST by a video-enabled telemedicine application and verified that I am speaking with the correct person using two identifiers.  Location: Patient: Virtual Visit Location Patient: Home Provider: Virtual Visit Location Provider: Home Office   I discussed the limitations of evaluation and management by  telemedicine and the availability of in person appointments. The patient expressed understanding and agreed to proceed.    History of Present Illness: [name] is a 46 y.o. who identifies as a female who was assigned female at birth, and is being seen today for nasal congestion.  Was seen at [facility] and given nasal spray and prednisone.  HPI: HPI  Problems:  Patient Active Problem List   Diagnosis Date Noted   Status post surgery 08/29/2019   GI bleed 03/23/2018   Leucocytosis 03/04/2016    Allergies: No Known Allergies Medications:  Current Outpatient Medications:    albuterol (VENTOLIN HFA) 108 (90 Base) MCG/ACT inhaler, Inhale 1-2 puffs into the lungs every 6 (six) hours as needed for wheezing or shortness of breath., Disp: 18 g, Rfl: 0   cetirizine-pseudoephedrine (ZYRTEC-D) 5-120 MG tablet, Take 1 tablet by mouth 2 (two) times daily as needed for rhinitis (Congestion)., Disp: 30 tablet, Rfl: 0   dextromethorphan 15 MG/5ML syrup, Take 10 mLs (30 mg total) by mouth 4 (four) times daily as needed for cough., Disp: 120 mL, Rfl: 0   estradiol (ESTRACE) 1 MG tablet, Take 1.5 tablets (1.5 mg total) by mouth daily., Disp: 135 tablet, Rfl: 0   famotidine (PEPCID) 20 MG tablet, Take 1 tablet (20 mg total) by mouth 2 (two) times daily., Disp: 60 tablet, Rfl: 0   fluticasone (FLONASE) 50  MCG/ACT nasal spray, Place 1 spray into both nostrils daily., Disp: 16 g, Rfl: 2   gabapentin (NEURONTIN) 300 MG capsule, Take by mouth., Disp: , Rfl:    melatonin 5 MG TABS, Take 5 mg by mouth at bedtime as needed. (Patient not taking: Reported on 03/26/2021), Disp: , Rfl:    predniSONE (STERAPRED UNI-PAK 21 TAB) 10 MG (21) TBPK tablet, Take by mouth daily. Take 6 tabs by mouth daily  for 2 days, then 5 tabs for 2 days, then 4 tabs for 2 days, then 3 tabs for 2 days, 2 tabs for 2 days, then 1 tab by mouth daily for 2 days, Disp: 42 tablet, Rfl: 0  Observations/Objective: Patient is well-developed,  well-nourished in no acute distress.  Resting comfortably at home.  Head is normocephalic, atraumatic.  No labored breathing.  Speech is clear and coherent with logical content.  Patient is alert and oriented at baseline.    Assessment and Plan: 1. COVID  Recent COVID diagnosis, but wasn't tested or treated.  Husband has COVID.  Symptoms started 2-3 days ago.  Will start molnupiravir in addition to her UC tx plan.  Follow Up Instructions: I discussed the assessment and treatment plan with the patient. The patient was provided an opportunity to ask questions and all were answered. The patient agreed with the plan and demonstrated an understanding of the instructions.  A copy of instructions were sent to the patient via MyChart unless otherwise noted below.     The patient was advised to call back or seek an in-person evaluation if the symptoms worsen or if the condition fails to improve as anticipated.  Time:  I spent 10 minutes with the patient via telehealth technology discussing the above problems/concerns.    [name], PA-C

## 2021-12-02 ENCOUNTER — Other Ambulatory Visit: Payer: Self-pay

## 2022-03-04 ENCOUNTER — Encounter: Payer: Self-pay | Admitting: Obstetrics and Gynecology

## 2022-03-04 DIAGNOSIS — R350 Frequency of micturition: Secondary | ICD-10-CM | POA: Insufficient documentation

## 2022-03-05 ENCOUNTER — Emergency Department
Admission: EM | Admit: 2022-03-05 | Discharge: 2022-03-05 | Disposition: A | Discharge status: Home / Self Care | Payer: Medicaid Other | Attending: Emergency Medicine | Admitting: Emergency Medicine

## 2022-03-05 ENCOUNTER — Encounter: Payer: Self-pay | Admitting: Obstetrics and Gynecology

## 2022-03-05 ENCOUNTER — Encounter: Payer: Self-pay | Admitting: Medical Oncology

## 2022-03-05 ENCOUNTER — Emergency Department: Payer: Medicaid Other

## 2022-03-05 ENCOUNTER — Telehealth: Payer: Self-pay | Admitting: Obstetrics and Gynecology

## 2022-03-05 DIAGNOSIS — R1084 Generalized abdominal pain: Secondary | ICD-10-CM | POA: Diagnosis present

## 2022-03-05 DIAGNOSIS — A0811 Acute gastroenteropathy due to Norwalk agent: Secondary | ICD-10-CM | POA: Diagnosis not present

## 2022-03-05 LAB — CBC
HCT: 48.4 % — ABNORMAL HIGH (ref 36.0–46.0)
Hemoglobin: 16.2 g/dL — ABNORMAL HIGH (ref 12.0–15.0)
MCH: 30.4 pg (ref 26.0–34.0)
MCHC: 33.5 g/dL (ref 30.0–36.0)
MCV: 90.8 fL (ref 80.0–100.0)
Platelets: 473 10*3/uL — ABNORMAL HIGH (ref 150–400)
RBC: 5.33 MIL/uL — ABNORMAL HIGH (ref 3.87–5.11)
RDW: 13.5 % (ref 11.5–15.5)
WBC: 16.9 10*3/uL — ABNORMAL HIGH (ref 4.0–10.5)
nRBC: 0 % (ref 0.0–0.2)

## 2022-03-05 LAB — COMPREHENSIVE METABOLIC PANEL
ALT: 26 U/L (ref 0–44)
AST: 23 U/L (ref 15–41)
Albumin: 4.3 g/dL (ref 3.5–5.0)
Alkaline Phosphatase: 72 U/L (ref 38–126)
Anion gap: 9 (ref 5–15)
BUN: 9 mg/dL (ref 6–20)
CO2: 25 mmol/L (ref 22–32)
Calcium: 9.7 mg/dL (ref 8.9–10.3)
Chloride: 100 mmol/L (ref 98–111)
Creatinine, Ser: 0.57 mg/dL (ref 0.44–1.00)
GFR, Estimated: >60 mL/min (ref >60)
Glucose, Bld: 95 mg/dL (ref 70–99)
Potassium: 4 mmol/L (ref 3.5–5.1)
Sodium: 134 mmol/L — ABNORMAL LOW (ref 135–145)
Total Bilirubin: 1.5 mg/dL — ABNORMAL HIGH (ref 0.3–1.2)
Total Protein: 7.8 g/dL (ref 6.5–8.1)

## 2022-03-05 LAB — URINALYSIS, ROUTINE W REFLEX MICROSCOPIC
Bilirubin Urine: NEGATIVE
Glucose, UA: NEGATIVE mg/dL
Hgb urine dipstick: NEGATIVE
Ketones, ur: NEGATIVE mg/dL
Leukocytes,Ua: NEGATIVE
Nitrite: NEGATIVE
Protein, ur: NEGATIVE mg/dL
Specific Gravity, Urine: 1.003 — ABNORMAL LOW (ref 1.005–1.030)
pH: 7 (ref 5.0–8.0)

## 2022-03-05 LAB — LIPASE, BLOOD: Lipase: 41 U/L (ref 11–51)

## 2022-03-05 MED ORDER — SODIUM CHLORIDE 0.9 % IV BOLUS
1000.0000 mL | Freq: Once | INTRAVENOUS | Status: AC
  Administered 2022-03-05: 1000 mL via INTRAVENOUS

## 2022-03-05 MED ORDER — IOHEXOL 300 MG/ML  SOLN
100.0000 mL | Freq: Once | INTRAMUSCULAR | Status: AC | PRN
  Administered 2022-03-05: 100 mL via INTRAVENOUS

## 2022-03-05 NOTE — ED Triage Notes (Signed)
Pt reports generalized abd pain x 3 days with urinary frequency and diarrhea.

## 2022-03-05 NOTE — Discharge Instructions (Signed)
Follow-up with your regular doctor if not improving to 3 days.  Follow the food choices to help relieve diarrhea in your discharge papers.  Drink plenty of water.  Return if worsening.

## 2022-03-05 NOTE — Telephone Encounter (Signed)
Attempted to reach patient, LVM to call back to discuss concerns.

## 2022-03-05 NOTE — ED Provider Notes (Signed)
[facility] Provider Note    Event Date/Time   First MD Initiated Contact with Patient 03/05/22 1216     (approximate)   History   Abdominal Pain   HPI  [name] is a 46 y.o. female with history of hysterectomy over 1 year ago, presents emergency department complaining of generalized abdominal pain.  States she has had abdominal pain for 3 days with urinary frequency and diarrhea.  No nausea or vomiting.  States her abdomen is tender all over.  Denies fever/chills.      Physical Exam   Triage Vital Signs: ED Triage Vitals  Enc Vitals Group     BP 03/05/22 1043 (!) 151/104     Pulse Rate 03/05/22 1043 97     Resp 03/05/22 1043 18     Temp 03/05/22 1043 98.6 F (37 C)     Temp Source 03/05/22 1043 Oral     SpO2 03/05/22 1043 97 %     Weight 03/05/22 1044 162 lb (73.5 kg)     Height 03/05/22 1044 '5\' 2"'$  (1.575 m)     Head Circumference --      Peak Flow --      Pain Score 03/05/22 1043 6     Pain Loc --      Pain Edu? --      Excl. in [place]? --     Most recent vital signs: Vitals:   03/05/22 1043 03/05/22 1359  BP: (!) 151/104 (!) 155/67  Pulse: 97 87  Resp: 18 18  Temp: 98.6 F (37 C) 97.9 F (36.6 C)  SpO2: 97% 100%     General: Awake, no distress.   CV:  Good peripheral perfusion. regular rate and  rhythm Resp:  Normal effort. Lungs CTA Abd:  No distention.  Tender in the right upper and right lower quadrant bowel sounds normal Other:      ED Results / Procedures / Treatments   Labs (all labs ordered are listed, but only abnormal results are displayed) Labs Reviewed  COMPREHENSIVE METABOLIC PANEL - Abnormal; Notable for the following components:      Result Value   Sodium 134 (*)    Total Bilirubin 1.5 (*)    All other components within normal limits  CBC - Abnormal; Notable for the following components:   WBC 16.9 (*)    RBC 5.33 (*)    Hemoglobin 16.2 (*)    HCT 48.4 (*)    Platelets 473 (*)    All other  components within normal limits  URINALYSIS, ROUTINE W REFLEX MICROSCOPIC - Abnormal; Notable for the following components:   Color, Urine YELLOW (*)    APPearance CLEAR (*)    Specific Gravity, Urine 1.003 (*)    All other components within normal limits  LIPASE, BLOOD  POC URINE PREG, ED     EKG     RADIOLOGY CT abdomen/pelvis IV contrast    PROCEDURES:   Procedures   MEDICATIONS ORDERED IN ED: Medications  sodium chloride 0.9 % bolus 1,000 mL (1,000 mLs Intravenous New Bag/Given 03/05/22 1246)  iohexol (OMNIPAQUE) 300 MG/ML solution 100 mL (100 mLs Intravenous Contrast Given 03/05/22 1352)     IMPRESSION / MDM / ASSESSMENT AND PLAN / ED COURSE  I reviewed the triage vital signs and the nursing notes.  Differential diagnosis includes, but is not limited to, acute cholecystitis, acute appendicitis, gastroenteritis, norovirus  Patient's presentation is most consistent with acute presentation with potential threat to life or bodily function.   Labs and imaging ordered   Labs concerning for infection as her WBC is 16.9, remainder are not reassuring.  CT abdomen/pelvis interpreted by me as being negative.  Patient does have some chronic liver changes.  I did explain these findings to the patient.  She is to follow-up with her regular doctor to obtain a MRI of her liver.  She is in agreement treatment plan.  She was encouraged to use a high-fiber diet or include items like bananas and yogurt to help relieve the diarrhea.  Return if worsening.  Discharged stable condition.  FINAL CLINICAL IMPRESSION(S) / ED DIAGNOSES   Final diagnoses:  Gastroenteritis due to norovirus     Rx / DC Orders   ED Discharge Orders     None        Note:  This document was prepared using Dragon voice recognition software and may include unintentional dictation errors.    Versie Starks, PA-C 03/05/22 1426    Lucrezia Starch, MD 03/05/22  1700

## 2022-03-05 NOTE — Telephone Encounter (Signed)
Pt states "stomach is tender to the touch" Pt states "a lot of belly pain with frequent urination at times there is an odor" Pt states " she feels like she can not empty urine completely. Please return call

## 2022-03-06 DIAGNOSIS — K760 Fatty (change of) liver, not elsewhere classified: Secondary | ICD-10-CM | POA: Insufficient documentation

## 2022-03-10 ENCOUNTER — Encounter: Payer: Self-pay | Admitting: Obstetrics and Gynecology

## 2022-03-17 ENCOUNTER — Telehealth: Payer: Self-pay | Admitting: Obstetrics and Gynecology

## 2022-03-19 ENCOUNTER — Encounter: Payer: Medicaid Other | Admitting: Obstetrics and Gynecology

## 2022-04-08 NOTE — Telephone Encounter (Signed)
Made in error

## 2022-04-21 ENCOUNTER — Other Ambulatory Visit: Payer: Self-pay | Admitting: Primary Care

## 2022-04-21 DIAGNOSIS — K760 Fatty (change of) liver, not elsewhere classified: Secondary | ICD-10-CM

## 2022-04-21 DIAGNOSIS — D134 Benign neoplasm of liver: Secondary | ICD-10-CM

## 2022-05-01 ENCOUNTER — Ambulatory Visit
Admission: RE | Admit: 2022-05-01 | Discharge: 2022-05-01 | Disposition: A | Discharge status: Home / Self Care | Payer: Medicaid Other | Source: Ambulatory Visit | Attending: Primary Care | Admitting: Primary Care

## 2022-05-01 DIAGNOSIS — D134 Benign neoplasm of liver: Secondary | ICD-10-CM | POA: Diagnosis present

## 2022-05-01 DIAGNOSIS — K760 Fatty (change of) liver, not elsewhere classified: Secondary | ICD-10-CM | POA: Insufficient documentation

## 2022-05-01 DIAGNOSIS — D135 Benign neoplasm of extrahepatic bile ducts: Secondary | ICD-10-CM | POA: Diagnosis present

## 2022-05-01 MED ORDER — GADOBUTROL 1 MMOL/ML IV SOLN
7.0000 mL | Freq: Once | INTRAVENOUS | Status: AC | PRN
  Administered 2022-05-01: 7 mL via INTRAVENOUS

## 2022-05-13 ENCOUNTER — Encounter: Payer: Medicaid Other | Admitting: Internal Medicine

## 2022-05-13 ENCOUNTER — Other Ambulatory Visit: Payer: Medicaid Other

## 2022-05-15 ENCOUNTER — Inpatient Hospital Stay: Payer: Medicaid Other | Attending: Internal Medicine | Admitting: Internal Medicine

## 2022-05-15 ENCOUNTER — Encounter: Payer: Self-pay | Admitting: Internal Medicine

## 2022-05-15 ENCOUNTER — Inpatient Hospital Stay: Payer: Medicaid Other

## 2022-05-15 VITALS — BP 146/94 | HR 104 | Temp 98.2°F | Resp 18 | Wt 165.4 lb

## 2022-05-15 DIAGNOSIS — D72829 Elevated white blood cell count, unspecified: Secondary | ICD-10-CM | POA: Diagnosis present

## 2022-05-15 DIAGNOSIS — D751 Secondary polycythemia: Secondary | ICD-10-CM | POA: Insufficient documentation

## 2022-05-15 DIAGNOSIS — R519 Headache, unspecified: Secondary | ICD-10-CM | POA: Insufficient documentation

## 2022-05-15 DIAGNOSIS — Z79899 Other long term (current) drug therapy: Secondary | ICD-10-CM | POA: Insufficient documentation

## 2022-05-15 DIAGNOSIS — F17210 Nicotine dependence, cigarettes, uncomplicated: Secondary | ICD-10-CM | POA: Diagnosis not present

## 2022-05-15 DIAGNOSIS — H9201 Otalgia, right ear: Secondary | ICD-10-CM | POA: Insufficient documentation

## 2022-05-15 DIAGNOSIS — D75839 Thrombocytosis, unspecified: Secondary | ICD-10-CM | POA: Insufficient documentation

## 2022-05-15 DIAGNOSIS — Z803 Family history of malignant neoplasm of breast: Secondary | ICD-10-CM | POA: Diagnosis not present

## 2022-05-15 LAB — CBC WITH DIFFERENTIAL/PLATELET
Abs Immature Granulocytes: 0.06 10*3/uL (ref 0.00–0.07)
Basophils Absolute: 0.1 10*3/uL (ref 0.0–0.1)
Basophils Relative: 1 %
Eosinophils Absolute: 0.6 10*3/uL — ABNORMAL HIGH (ref 0.0–0.5)
Eosinophils Relative: 4 %
HCT: 44.6 % (ref 36.0–46.0)
Hemoglobin: 15.2 g/dL — ABNORMAL HIGH (ref 12.0–15.0)
Immature Granulocytes: 0 %
Lymphocytes Relative: 23 %
Lymphs Abs: 3.3 10*3/uL (ref 0.7–4.0)
MCH: 30.6 pg (ref 26.0–34.0)
MCHC: 34.1 g/dL (ref 30.0–36.0)
MCV: 89.9 fL (ref 80.0–100.0)
Monocytes Absolute: 0.9 10*3/uL (ref 0.1–1.0)
Monocytes Relative: 6 %
Neutro Abs: 9.7 10*3/uL — ABNORMAL HIGH (ref 1.7–7.7)
Neutrophils Relative %: 66 %
Platelets: 461 10*3/uL — ABNORMAL HIGH (ref 150–400)
RBC: 4.96 MIL/uL (ref 3.87–5.11)
RDW: 13.2 % (ref 11.5–15.5)
WBC: 14.6 10*3/uL — ABNORMAL HIGH (ref 4.0–10.5)
nRBC: 0 % (ref 0.0–0.2)

## 2022-05-15 LAB — SEDIMENTATION RATE: Sed Rate: 4 mm/hr (ref 0–20)

## 2022-05-15 LAB — C-REACTIVE PROTEIN: CRP: 0.6 mg/dL (ref <1.0)

## 2022-05-15 NOTE — Progress Notes (Signed)
Patient here today for new evaluation regarding leukocytosis.

## 2022-05-15 NOTE — Progress Notes (Signed)
[facility]  [phone] [phone]  ID: [name] OB: 07/28/1976  MR#: [id number]  ZJQ#:[id number]  Patient Care Team: [facility] as PCP - General (General Practice) Jasmine Junker, RN as Registered Nurse Jasmine Demark, RN (Inactive) as Registered Nurse  REFERRING PROVIDER: Dr. Elsworth Soho  REASON FOR REFERRAL: leucocytosis   HPI: [name] is a 46 y.o. female with pmh of depression, chronic smoking and FNH was referred to Hematology for further work up of leucocytosis.   Patient reports history of chronic smoking 1 pack/day for several years now.  She is working with her PCP for smoking cessation and is on Wellbutrin.  Reports she has not been able to cut down on smoking yet.   Denies any oral ulcers, rash, dry eyes/mouth. Denies chronic infections.  Patient denies fever, chills, nausea, vomiting, shortness of breath, cough, abdominal pain, bleeding, bowel or bladder issues.  She reports right sided headache and right sided ear ache. She is taking ibuprofen, fluticasone with minimal improvement.   Labs reviewed. CBC showed WBC ranging 12-21 since 2015 and fluctuating HB with high in 16 and platelets high in ~500. Differential reviewed from 2015 and 2017 which showed neutrophilia.   REVIEW OF SYSTEMS:   ROS  As per HPI. Otherwise, a complete review of systems is negative.  PAST MEDICAL HISTORY: Past Medical History:  Diagnosis Date   Depression    Focal nodular hyperplasia determined by liver biopsy    Leukocytosis    Tobacco use     PAST SURGICAL HISTORY: Past Surgical History:  Procedure Laterality Date   ABDOMINAL HYSTERECTOMY     BREAST BIOPSY Right 2016   [place] core, fat necrosis   COLONOSCOPY WITH PROPOFOL N/A 03/25/2018   Procedure: COLONOSCOPY WITH PROPOFOL;  Surgeon: Toledo, Benay Pike, MD;  Location: [facility] ENDOSCOPY;  Service: Gastroenterology;  Laterality: N/A;    ESOPHAGOGASTRODUODENOSCOPY (EGD) WITH PROPOFOL N/A 03/25/2018   Procedure: ESOPHAGOGASTRODUODENOSCOPY (EGD) WITH PROPOFOL;  Surgeon: Toledo, Benay Pike, MD;  Location: [facility] ENDOSCOPY;  Service: Gastroenterology;  Laterality: N/A;   INTRAUTERINE DEVICE INSERTION     IUD REMOVAL  08/29/2019   Procedure: INTRAUTERINE DEVICE (IUD) REMOVAL;  Surgeon: Harlin Heys, MD;  Location: [facility] ORS;  Service: Gynecology;;   KNEE CARTILAGE SURGERY Right 02/2015   LAPAROSCOPIC ASSISTED VAGINAL HYSTERECTOMY N/A 08/29/2019   Procedure: LAPAROSCOPIC ASSISTED VAGINAL HYSTERECTOMY;  Surgeon: Harlin Heys, MD;  Location: [facility] ORS;  Service: Gynecology;  Laterality: N/A;   LAPAROSCOPIC BILATERAL SALPINGO OOPHERECTOMY Bilateral 08/29/2019   Procedure: LAPAROSCOPIC BILATERAL SALPINGO OOPHORECTOMY;  Surgeon: Harlin Heys, MD;  Location: [facility] ORS;  Service: Gynecology;  Laterality: Bilateral;   LAPAROSCOPIC TUBAL LIGATION      FAMILY HISTORY: Family History  Problem Relation Age of Onset   Hypertension Mother    Prostate cancer Father    Hypertension Father    Breast cancer Maternal Grandmother 65   Colon cancer Neg Hx     HEALTH MAINTENANCE: Social History   Tobacco Use   Smoking status: Every Day    Packs/day: 1.00    Years: 10.00    Total pack years: 10.00    Types: Cigarettes   Smokeless tobacco: Never  Vaping Use   Vaping Use: Never used  Substance Use Topics   Alcohol use: No    Alcohol/week: 0.0 standard drinks of alcohol   Drug use: No     No Known Allergies  Current Outpatient Medications  Medication Sig Dispense  Refill   buPROPion (WELLBUTRIN XL) 150 MG 24 hr tablet Take 150 mg by mouth daily.     gabapentin (NEURONTIN) 300 MG capsule Take by mouth.     ibuprofen (ADVIL) 200 MG tablet Take 200 mg by mouth every 6 (six) hours as needed.     albuterol (VENTOLIN HFA) 108 (90 Base) MCG/ACT inhaler Inhale 1-2 puffs into the lungs every 6 (six) hours as needed for wheezing or  shortness of breath. (Patient not taking: Reported on 05/15/2022) 18 g 0   cetirizine-pseudoephedrine (ZYRTEC-D) 5-120 MG tablet Take 1 tablet by mouth 2 (two) times daily as needed for rhinitis (Congestion). (Patient not taking: Reported on 05/15/2022) 30 tablet 0   dextromethorphan 15 MG/5ML syrup Take 10 mLs (30 mg total) by mouth 4 (four) times daily as needed for cough. (Patient not taking: Reported on 05/15/2022) 120 mL 0   estradiol (ESTRACE) 1 MG tablet Take 1.5 tablets (1.5 mg total) by mouth daily. 135 tablet 0   famotidine (PEPCID) 20 MG tablet Take 1 tablet (20 mg total) by mouth 2 (two) times daily. (Patient not taking: Reported on 05/15/2022) 60 tablet 0   fluticasone (FLONASE) 50 MCG/ACT nasal spray Place 1 spray into both nostrils daily. (Patient not taking: Reported on 05/15/2022) 16 g 2   melatonin 5 MG TABS Take 5 mg by mouth at bedtime as needed. (Patient not taking: Reported on 03/26/2021)     predniSONE (STERAPRED UNI-PAK 21 TAB) 10 MG (21) TBPK tablet Take by mouth daily. Take 6 tabs by mouth daily  for 2 days, then 5 tabs for 2 days, then 4 tabs for 2 days, then 3 tabs for 2 days, 2 tabs for 2 days, then 1 tab by mouth daily for 2 days (Patient not taking: Reported on 05/15/2022) 42 tablet 0   No current facility-administered medications for this visit.    OBJECTIVE: Vitals:   05/15/22 1446  BP: (!) 146/94  Pulse: (!) 104  Resp: 18  Temp: 98.2 F (36.8 C)  SpO2: 96%     Body mass index is 30.25 kg/m.      General: Well-developed, well-nourished, no acute distress. Eyes: Pink conjunctiva, anicteric sclera. HEENT: Normocephalic, moist mucous membranes, clear oropharnyx. Lungs: Clear to auscultation bilaterally. Heart: Regular rate and rhythm. No rubs, murmurs, or gallops. Abdomen: Soft, nontender, nondistended. No organomegaly noted, normoactive bowel sounds. Musculoskeletal: No edema, cyanosis, or clubbing. Neuro: Alert, answering all questions appropriately. Cranial  nerves grossly intact. Skin: No rashes or petechiae noted. Psych: Normal affect. Lymphatics: No cervical, calvicular, axillary or inguinal LAD. Ears - otoscopic exam normal bilaterally.   LAB RESULTS:  Lab Results  Component Value Date   NA 134 (L) 03/05/2022   K 4.0 03/05/2022   CL 100 03/05/2022   CO2 25 03/05/2022   GLUCOSE 95 03/05/2022   BUN 9 03/05/2022   CREATININE 0.57 03/05/2022   CALCIUM 9.7 03/05/2022   PROT 7.8 03/05/2022   ALBUMIN 4.3 03/05/2022   AST 23 03/05/2022   ALT 26 03/05/2022   ALKPHOS 72 03/05/2022   BILITOT 1.5 (H) 03/05/2022   GFRNONAA >60 03/05/2022   GFRAA >60 09/21/2019    Lab Results  Component Value Date   WBC 16.9 (H) 03/05/2022   NEUTROABS 9.9 (H) 03/27/2016   HGB 16.2 (H) 03/05/2022   HCT 48.4 (H) 03/05/2022   MCV 90.8 03/05/2022   PLT 473 (H) 03/05/2022    No results found for: "TIBC", "FERRITIN", "IRONPCTSAT"   STUDIES: MR ABDOMEN [facility]  CONTRAST  Result Date: 05/02/2022 CLINICAL DATA:  Characterize liver lesions incidentally identified by prior CT EXAM: MRI ABDOMEN WITHOUT AND WITH CONTRAST TECHNIQUE: Multiplanar multisequence MR imaging of the abdomen was performed both before and after the administration of intravenous contrast. CONTRAST:  37m GADAVIST GADOBUTROL 1 MMOL/ML IV SOLN COMPARISON:  CT abdomen pelvis, 03/05/2022, 03/07/2020, 03/23/2018, 11/01/2013, 07/12/2008 FINDINGS: Lower chest: No acute abnormality. Hepatobiliary: Hepatomegaly, maximum coronal span 25.0 cm. Severe hepatic steatosis. Focal lesion in the posterior right lobe of the liver, hepatic segment VI, with very subtle intrinsic T2 hyperintensity and progressive contrast enhancement on multiphasic imaging, measuring 5.9 x 3.7 cm (series 23, image 49). Appearance especially by CT, is suggestive of a stellate central scar and this lesion has slightly enlarged, and then slightly diminished over a long period of time including numerous interval follow-up examinations dating  back to at least 2009. Additional lesion with similar imaging and contrast enhancement characteristics in the anterior right lobe of the liver, hepatic segment V/VIII, measuring 1.4 x 1.3 cm (series 23, image 34). This lesion has been noted on examinations dating back to at least 03/23/2018. No gallstones, gallbladder wall thickening, or biliary dilatation. Pancreas: Unremarkable. No pancreatic ductal dilatation or surrounding inflammatory changes. Spleen: Normal in size without significant abnormality. 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 The right adrenal gland is normal. Kidneys are normal, without renal calculi, solid lesion, or hydronephrosis. Stomach/Bowel: Stomach is within normal limits. No evidence of bowel wall thickening, distention, or inflammatory changes. Vascular/Lymphatic: No significant vascular findings are present. No enlarged abdominal lymph nodes. Other: No abdominal wall hernia or abnormality. No ascites. Musculoskeletal: No acute or significant osseous findings. IMPRESSION: 1. Lesion in the posterior right lobe of the liver, hepatic segment VI, measuring 5.9 x 3.7 cm. Imaging and contrast enhancement characteristics are most suggestive of a benign focal nodular hyperplasia. Lesion has slightly enlarged, and then subsequently slowly diminished in size over a long period of time including many interval examinations dating back to at least 2009. 2. Additional lesion with similar imaging and contrast enhancement characteristics in the anterior right lobe of the liver, hepatic segment V/VIII, measuring 1.4 x 1.3 cm. This lesion has been noted on examinations dating back to at least 2019 and as above is most likely an additional small focal nodular hyperplasia. 3. No specific further follow-up or characterization is required for these benign lesions. 4. Severe hepatic steatosis and hepatomegaly. Electronically Signed   By: ADelanna AhmadiM.D.   On:  05/02/2022 13:01    ASSESSMENT AND PLAN:   CRonnette Rumpis a 46y.o. female with pmh of depression, chronic smoking and FNH presented to Hematology clinic for further work up of leucocytosis   # Leucocytosis, chronic # Thrombocytosis, chronic # Erythrocytosis, chronic  - Labs reviewed. CBC showed WBC ranging 12-21 since 2015 and fluctuating HB with high in 16 and platelets high in ~500.  - Labs as below. Rule out myeloproliferative disorders. - ESR/CRP - If mutational testing is negative, likely etiology could be smoking which can cause elevation in all cell lines as a stress response.  - We discussed about smoking cessation.  Patient is interested and is working with her PCP she is on Wellbutrin. - I reviewed the medication list with the patient. No offending agents identified.   #Right sided headache and ear pain -On otoscopic exam, there was no erythema or fluid noted in bilateral ear canals.  -Patient is taking fluticasone nasal spray.  She  will add allergy medication.  Advised patient to monitor her symptoms and reach out to PCP if it does not resolve in next few days.   Orders Placed This Encounter  Procedures   JAK2 [id number] rfx CALR/MPL/E12-15   BCR-ABL1 FISH   Sedimentation rate   C-reactive protein   CBC with Differential    RTC in 3 weeks for MD visit to discuss labs. Labs today.   Patient expressed understanding and was in agreement with this plan. She also understands that She can call clinic at any time with any questions, concerns, or complaints.     Jane Canary, MD   05/15/2022 3:30 PM

## 2022-05-16 ENCOUNTER — Encounter: Payer: Self-pay | Admitting: Internal Medicine

## 2022-05-19 LAB — BCR-ABL1 FISH
Cells Analyzed: 200
Cells Counted: 200

## 2022-05-23 ENCOUNTER — Encounter: Payer: Self-pay | Admitting: Internal Medicine

## 2022-05-23 LAB — JAK2 V617F RFX CALR/MPL/E12-15

## 2022-05-23 LAB — CALR +MPL + E12-E15  (REFLEX)

## 2022-06-10 ENCOUNTER — Ambulatory Visit: Payer: Medicaid Other | Admitting: Internal Medicine

## 2022-06-20 ENCOUNTER — Ambulatory Visit
Admission: EM | Admit: 2022-06-20 | Discharge: 2022-06-20 | Disposition: A | Discharge status: Home / Self Care | Payer: Medicaid Other | Attending: Physician Assistant | Admitting: Physician Assistant

## 2022-06-20 ENCOUNTER — Encounter: Payer: Self-pay | Admitting: Emergency Medicine

## 2022-06-20 ENCOUNTER — Telehealth: Payer: Medicaid Other | Admitting: Physician Assistant

## 2022-06-20 DIAGNOSIS — J069 Acute upper respiratory infection, unspecified: Secondary | ICD-10-CM | POA: Diagnosis present

## 2022-06-20 DIAGNOSIS — B9789 Other viral agents as the cause of diseases classified elsewhere: Secondary | ICD-10-CM

## 2022-06-20 DIAGNOSIS — R0981 Nasal congestion: Secondary | ICD-10-CM | POA: Insufficient documentation

## 2022-06-20 DIAGNOSIS — R051 Acute cough: Secondary | ICD-10-CM | POA: Diagnosis present

## 2022-06-20 DIAGNOSIS — Z20822 Contact with and (suspected) exposure to covid-19: Secondary | ICD-10-CM | POA: Diagnosis not present

## 2022-06-20 DIAGNOSIS — J019 Acute sinusitis, unspecified: Secondary | ICD-10-CM | POA: Diagnosis not present

## 2022-06-20 LAB — RESP PANEL BY RT-PCR (FLU A&B, COVID) ARPGX2
Influenza A by PCR: NEGATIVE
Influenza B by PCR: NEGATIVE
SARS Coronavirus 2 by RT PCR: NEGATIVE

## 2022-06-20 MED ORDER — PREDNISONE 20 MG PO TABS: 40.0000 mg | ORAL_TABLET | Freq: Every day | ORAL | 0 refills | Status: DC

## 2022-06-20 MED ORDER — PROMETHAZINE-DM 6.25-15 MG/5ML PO SYRP: 5.0000 mL | ORAL_SOLUTION | Freq: Four times a day (QID) | ORAL | 0 refills | Status: DC | PRN

## 2022-06-20 MED ORDER — BENZONATATE 200 MG PO CAPS: 200.0000 mg | ORAL_CAPSULE | Freq: Three times a day (TID) | ORAL | 0 refills | Status: DC | PRN

## 2022-06-20 MED ORDER — IPRATROPIUM BROMIDE 0.06 % NA SOLN
2.0000 | Freq: Four times a day (QID) | NASAL | 0 refills | Status: DC
Start: 2022-06-20 — End: 2023-07-28

## 2022-06-20 NOTE — Discharge Instructions (Addendum)
-  If the COVID test is positive.  If it is positive we will call and consent antiviral medicine.  If positive you will need to isolate 5 days and wear mask for 5 days. - If flu is positive we can send Tamiflu. - If both tests are negative, you will not hear from [place] and your symptoms are due to another viral illness.  I have sent cough medication and nasal spray to the pharmacy.  URI/COLD SYMPTOMS: Your exam today is consistent with a viral illness. Antibiotics are not indicated at this time. Use medications as directed, including cough syrup, nasal saline, and decongestants. Your symptoms should improve over the next few days and resolve within 7-10 days. Increase rest and fluids. F/u if symptoms worsen or predominate such as sore throat, ear pain, productive cough, shortness of breath, or if you develop high fevers or worsening fatigue over the next several days.

## 2022-06-20 NOTE — ED Provider Notes (Signed)
[facility]    CSN: [id number] Arrival date & time: 06/20/22  9417      History   Chief Complaint Chief Complaint  Patient presents with   Nasal Congestion    HPI Aspin Palomarez is a 46 y.o. female presenting for onset of fatigue, cough, congestion, sneezing.  Symptoms all started yesterday.  She says she feels very terrible.  She has been around a couple of sick contacts but does not know what they have.  She says no one has COVID.  Denies fever, chest pain, breathing difficulty, abdominal pain, nausea/vomiting or diarrhea.  Admits to mild sore throat.  Patient has not taken any cough medicine for symptoms.  She says that she is so sick she "cannot even smoke my cigarette."  She also reports the last time she was this sick she had received antibiotics.  Patient does not have any medical history significant for respiratory conditions.  No other complaints.  HPI  Past Medical History:  Diagnosis Date   Depression    Focal nodular hyperplasia determined by liver biopsy    Leukocytosis    Tobacco use     Patient Active Problem List   Diagnosis Date Noted   Thrombocytosis 05/15/2022   Erythrocytosis 05/15/2022   Status post surgery 08/29/2019   GI bleed 03/23/2018   Leucocytosis 03/04/2016    Past Surgical History:  Procedure Laterality Date   ABDOMINAL HYSTERECTOMY     BREAST BIOPSY Right 2016   [place] core, fat necrosis   COLONOSCOPY WITH PROPOFOL N/A 03/25/2018   Procedure: COLONOSCOPY WITH PROPOFOL;  Surgeon: Toledo, Benay Pike, MD;  Location: [facility];  Service: Gastroenterology;  Laterality: N/A;   ESOPHAGOGASTRODUODENOSCOPY (EGD) WITH PROPOFOL N/A 03/25/2018   Procedure: ESOPHAGOGASTRODUODENOSCOPY (EGD) WITH PROPOFOL;  Surgeon: Toledo, Benay Pike, MD;  Location: [facility];  Service: Gastroenterology;  Laterality: N/A;   INTRAUTERINE DEVICE INSERTION     IUD REMOVAL  08/29/2019   Procedure: INTRAUTERINE DEVICE (IUD) REMOVAL;  Surgeon: Harlin Heys, MD;  Location: [facility];  Service: Gynecology;;   KNEE CARTILAGE SURGERY Right 02/2015   LAPAROSCOPIC ASSISTED VAGINAL HYSTERECTOMY N/A 08/29/2019   Procedure: LAPAROSCOPIC ASSISTED VAGINAL HYSTERECTOMY;  Surgeon: Harlin Heys, MD;  Location: [facility];  Service: Gynecology;  Laterality: N/A;   LAPAROSCOPIC BILATERAL SALPINGO OOPHERECTOMY Bilateral 08/29/2019   Procedure: LAPAROSCOPIC BILATERAL SALPINGO OOPHORECTOMY;  Surgeon: Harlin Heys, MD;  Location: [facility];  Service: Gynecology;  Laterality: Bilateral;   LAPAROSCOPIC TUBAL LIGATION      OB History     Gravida  4   Para  3   Term      Preterm      AB  1   Living         SAB  1   IAB      Ectopic      Multiple      Live Births           Obstetric Comments  1st Menstrual Cycle:  18 1st Pregnancy:  18           Home Medications    Prior to Admission medications   Medication Sig Start Date End Date Taking? Authorizing Provider  benzonatate (TESSALON) 200 MG capsule Take 1 capsule (200 mg total) by mouth 3 (three) times daily as needed for cough. 06/20/22  Yes Danton Clap, PA-C  estradiol (ESTRACE) 1 MG tablet Take 1.5 tablets (1.5 mg total) by mouth daily. 03/26/21 06/20/22 Yes Jeannie Fend  Jeneen Rinks, MD  gabapentin (NEURONTIN) 300 MG capsule Take by mouth. 03/13/21  Yes [provider]  ipratropium (ATROVENT) 0.06 % nasal spray Place 2 sprays into both nostrils 4 (four) times daily. 06/20/22  Yes Danton Clap, PA-C  promethazine-dextromethorphan (PROMETHAZINE-DM) 6.25-15 MG/5ML syrup Take 5 mLs by mouth 4 (four) times daily as needed. 06/20/22  Yes Danton Clap, PA-C  albuterol (VENTOLIN HFA) 108 (90 Base) MCG/ACT inhaler Inhale 1-2 puffs into the lungs every 6 (six) hours as needed for wheezing or shortness of breath. Patient not taking: Reported on 05/15/2022 10/09/21   Marney Setting, NP  buPROPion (WELLBUTRIN XL) 150 MG 24 hr tablet Take 150 mg by mouth daily. 05/06/22    [provider]  cetirizine-pseudoephedrine (ZYRTEC-D) 5-120 MG tablet Take 1 tablet by mouth 2 (two) times daily as needed for rhinitis (Congestion). Patient not taking: Reported on 05/15/2022 06/26/21   Coral Spikes, DO  dextromethorphan 15 MG/5ML syrup Take 10 mLs (30 mg total) by mouth 4 (four) times daily as needed for cough. Patient not taking: Reported on 05/15/2022 10/09/21   Marney Setting, NP  famotidine (PEPCID) 20 MG tablet Take 1 tablet (20 mg total) by mouth 2 (two) times daily. Patient not taking: Reported on 05/15/2022 12/17/20   Rodriguez-Southworth, Sunday Spillers, PA-C  fluticasone [facility]) 50 MCG/ACT nasal spray Place 1 spray into both nostrils daily. Patient not taking: Reported on 05/15/2022 10/09/21   Marney Setting, NP  ibuprofen (ADVIL) 200 MG tablet Take 200 mg by mouth every 6 (six) hours as needed.    [provider]  melatonin 5 MG TABS Take 5 mg by mouth at bedtime as needed. Patient not taking: Reported on 03/26/2021    [provider]  predniSONE (STERAPRED UNI-PAK 21 TAB) 10 MG (21) TBPK tablet Take by mouth daily. Take 6 tabs by mouth daily  for 2 days, then 5 tabs for 2 days, then 4 tabs for 2 days, then 3 tabs for 2 days, 2 tabs for 2 days, then 1 tab by mouth daily for 2 days Patient not taking: Reported on 05/15/2022 10/09/21   Marney Setting, NP  traZODone (DESYREL) 50 MG tablet Take 1 tablet (50 mg total) by mouth at bedtime. 06/26/20 08/23/20  Margarette Canada, NP    Family History Family History  Problem Relation Age of Onset   Hypertension Mother    Prostate cancer Father    Hypertension Father    Breast cancer Maternal Grandmother 59   Colon cancer Neg Hx     Social History Social History   Tobacco Use   Smoking status: Every Day    Packs/day: 1.00    Years: 10.00    Total pack years: 10.00    Types: Cigarettes   Smokeless tobacco: Never  Vaping Use   Vaping Use: Never used  Substance Use Topics   Alcohol use: No     Alcohol/week: 0.0 standard drinks of alcohol   Drug use: No     Allergies   Patient has no known allergies.   Review of Systems Review of Systems  Constitutional:  Positive for fatigue. Negative for chills, diaphoresis and fever.  HENT:  Positive for congestion, rhinorrhea and sore throat. Negative for ear pain, sinus pressure and sinus pain.   Respiratory:  Positive for cough. Negative for shortness of breath.   Cardiovascular:  Negative for chest pain.  Gastrointestinal:  Negative for abdominal pain, nausea and vomiting.  Musculoskeletal:  Negative for arthralgias and  myalgias.  Skin:  Negative for rash.  Neurological:  Negative for weakness and headaches.  Hematological:  Negative for adenopathy.     Physical Exam Triage Vital Signs ED Triage Vitals  Enc Vitals Group     BP      Pulse      Resp      Temp      Temp src      SpO2      Weight      Height      Head Circumference      Peak Flow      Pain Score      Pain Loc      Pain Edu?      Excl. in [place]?    No data found.  Updated Vital Signs BP (!) 144/77 (BP Location: Left Arm)   Pulse (!) 108   Temp 99 F (37.2 C) (Oral)   Resp 18   Ht '5\' 2"'$  (1.575 m)   Wt 165 lb 5.5 oz (75 kg)   LMP 08/22/2019   SpO2 95%   BMI 30.24 kg/m      Physical Exam Vitals and nursing note reviewed.  Constitutional:      General: She is not in acute distress.    Appearance: Normal appearance. She is ill-appearing. She is not toxic-appearing.  HENT:     Head: Normocephalic and atraumatic.     Right Ear: Tympanic membrane, ear canal and external ear normal.     Left Ear: Tympanic membrane, ear canal and external ear normal.     Nose: Congestion present.     Mouth/Throat:     Mouth: Mucous membranes are moist.     Pharynx: Oropharynx is clear.  Eyes:     General: No scleral icterus.       Right eye: No discharge.        Left eye: No discharge.     Conjunctiva/sclera: Conjunctivae normal.  Cardiovascular:     Rate  and Rhythm: Regular rhythm. Tachycardia present.     Heart sounds: Normal heart sounds.  Pulmonary:     Effort: Pulmonary effort is normal. No respiratory distress.     Breath sounds: Normal breath sounds.  Musculoskeletal:     Cervical back: Neck supple.  Skin:    General: Skin is dry.  Neurological:     General: No focal deficit present.     Mental Status: She is alert. Mental status is at baseline.     Motor: No weakness.     Gait: Gait normal.  Psychiatric:        Mood and Affect: Mood normal.        Behavior: Behavior normal.        Thought Content: Thought content normal.      UC Treatments / Results  Labs (all labs ordered are listed, but only abnormal results are displayed) Labs Reviewed  RESP PANEL BY RT-PCR (FLU A&B, COVID) [id number]    EKG   Radiology No results found.  Procedures Procedures (including critical care time)  Medications Ordered in UC Medications - No data to display  Initial Impression / Assessment and Plan / UC Course  I have reviewed the triage vital signs and the nursing notes.  Pertinent labs & imaging results that were available during my care of the patient were reviewed by me and considered in my medical decision making (see chart for details).   46 year old female presenting for cough, congestion, sneezing, sore throat.  Denies fever, breathing occultly.  Has been around a couple sick contacts.  Has taken 2 COVID test at home and they both been negative.  Patient is afebrile.  She is mildly ill-appearing but nontoxic.  On exam she has nasal congestion and postnasal drainage.  Throat is clear.  Chest clear auscultation heart regular rhythm.  Respiratory panel obtained.  Advised patient we will call her with results of positive.  If positive for COVID, reviewed current CDC guidelines, resolution protocol and ED precautions and will consider treatment with antiviral medication.  Advised patient if she does not hear from [place], those tests  are negative and she likely has another virus.  Supportive care encouraged.  Sent benzonatate, Promethazine DM and Atrovent nasal spray.  Advised her that she should expect to be sick for a week or 2 but return for any worsening of symptoms.  Work note given.  Negative resp panel.  Final Clinical Impressions(s) / UC Diagnoses   Final diagnoses:  Viral upper respiratory tract infection  Nasal congestion  Acute cough     Discharge Instructions      -If the COVID test is positive.  If it is positive we will call and consent antiviral medicine.  If positive you will need to isolate 5 days and wear mask for 5 days. - If flu is positive we can send Tamiflu. - If both tests are negative, you will not hear from [place] and your symptoms are due to another viral illness.  I have sent cough medication and nasal spray to the pharmacy.  URI/COLD SYMPTOMS: Your exam today is consistent with a viral illness. Antibiotics are not indicated at this time. Use medications as directed, including cough syrup, nasal saline, and decongestants. Your symptoms should improve over the next few days and resolve within 7-10 days. Increase rest and fluids. F/u if symptoms worsen or predominate such as sore throat, ear pain, productive cough, shortness of breath, or if you develop high fevers or worsening fatigue over the next several days.       ED Prescriptions     Medication Sig Dispense Auth. Provider   benzonatate (TESSALON) 200 MG capsule Take 1 capsule (200 mg total) by mouth 3 (three) times daily as needed for cough. 30 capsule Danton Clap, PA-C   promethazine-dextromethorphan (PROMETHAZINE-DM) 6.25-15 MG/5ML syrup Take 5 mLs by mouth 4 (four) times daily as needed. 118 mL Laurene Footman B, PA-C   ipratropium (ATROVENT) 0.06 % nasal spray Place 2 sprays into both nostrils 4 (four) times daily. 15 mL Danton Clap, PA-C      PDMP not reviewed this encounter.   Danton Clap, PA-C 06/20/22 [phone]

## 2022-06-20 NOTE — Progress Notes (Signed)
E-Visit for Sinus Problems  We are sorry that you are not feeling well.  Here is how we plan to help!  Based on what you have shared with me it looks like you have sinusitis.  Sinusitis is inflammation and infection in the sinus cavities of the head.  Based on your presentation I believe you most likely have Acute Viral Sinusitis.This is an infection most likely caused by a virus. There is not specific treatment for viral sinusitis other than to help you with the symptoms until the infection runs its course.  You may use an oral decongestant such as Mucinex D or if you have glaucoma or high blood pressure use plain Mucinex. Saline nasal spray help and can safely be used as often as needed for congestion, I have prescribed: Prednisone '20mg'$  Take 2 tablets daily for 5 days  Some authorities believe that zinc sprays or the use of Echinacea may shorten the course of your symptoms.  Sinus infections are not as easily transmitted as other respiratory infection, however we still recommend that you avoid close contact with loved ones, especially the very young and elderly.  Remember to wash your hands thoroughly throughout the day as this is the number one way to prevent the spread of infection!  Home Care: Only take medications as instructed by your medical team. Do not take these medications with alcohol. A steam or ultrasonic humidifier can help congestion.  You can place a towel over your head and breathe in the steam from hot water coming from a faucet. Avoid close contacts especially the very young and the elderly. Cover your mouth when you cough or sneeze. Always remember to wash your hands.  Get Help Right Away If: You develop worsening fever or sinus pain. You develop a severe head ache or visual changes. Your symptoms persist after you have completed your treatment plan.  Make sure you Understand these instructions. Will watch your condition. Will get help right away if you are not doing  well or get worse.   Thank you for choosing an e-visit.  Your e-visit answers were reviewed by a board certified advanced clinical practitioner to complete your personal care plan. Depending upon the condition, your plan could have included both over the counter or prescription medications.  Please review your pharmacy choice. Make sure the pharmacy is open so you can pick up prescription now. If there is a problem, you may contact your provider through [employer] and have the prescription routed to another pharmacy.  Your safety is important to [place]. If you have drug allergies check your prescription carefully.   For the next 24 hours you can use MyChart to ask questions about today's visit, request a non-urgent call back, or ask for a work or school excuse. You will get an email in the next two days asking about your experience. I hope that your e-visit has been valuable and will speed your recovery.   I provided 5 minutes of non face-to-face time during this encounter for chart review and documentation.

## 2022-06-20 NOTE — ED Triage Notes (Signed)
Pt c/o nasal congestion, sneezing, cough, chest congestion. Started yesterday. She states she took 2 home covid test and were negative. She has been around several people that have been sick.

## 2022-06-22 MED ORDER — AMOXICILLIN-POT CLAVULANATE 875-125 MG PO TABS: 1.0000 | ORAL_TABLET | Freq: Two times a day (BID) | ORAL | 0 refills | Status: DC

## 2022-06-22 NOTE — Addendum Note (Signed)
Addended by: Evelina Dun A on: 06/22/2022 10:14 AM   Modules accepted: Orders

## 2022-06-24 ENCOUNTER — Emergency Department
Admission: EM | Admit: 2022-06-24 | Discharge: 2022-06-24 | Disposition: A | Discharge status: Home / Self Care | Payer: Medicaid Other | Attending: Emergency Medicine | Admitting: Emergency Medicine

## 2022-06-24 ENCOUNTER — Emergency Department: Payer: Medicaid Other

## 2022-06-24 ENCOUNTER — Encounter: Payer: Self-pay | Admitting: Medical Oncology

## 2022-06-24 ENCOUNTER — Other Ambulatory Visit: Payer: Self-pay

## 2022-06-24 DIAGNOSIS — R059 Cough, unspecified: Secondary | ICD-10-CM | POA: Diagnosis present

## 2022-06-24 DIAGNOSIS — J40 Bronchitis, not specified as acute or chronic: Secondary | ICD-10-CM | POA: Insufficient documentation

## 2022-06-24 MED ORDER — ALBUTEROL SULFATE HFA 108 (90 BASE) MCG/ACT IN AERS: 2.0000 | INHALATION_SPRAY | Freq: Four times a day (QID) | RESPIRATORY_TRACT | 0 refills | Status: DC | PRN

## 2022-06-24 MED ORDER — PREDNISONE 50 MG PO TABS: 50.0000 mg | ORAL_TABLET | Freq: Every day | ORAL | 0 refills | Status: DC

## 2022-06-24 NOTE — ED Triage Notes (Signed)
Pt reports nasal congestion, cough, cold sx's that began 9/28. Went to UC and has been placed on meds but they arent helping.

## 2022-06-24 NOTE — ED Provider Notes (Signed)
[facility] Provider Note  Patient Contact: 3:30 PM (approximate)   History   URI   HPI  Gabrielly Mccrystal is a 46 y.o. female who presents the emergency department planing of ongoing cough, congestion.  Patient was seen in urgent care, diagnosed with a virus and placed on low-dose steroid, Augmentin, symptom control medications.  Patient states that she is still coughing.  No reported fevers.  No difficulty breathing.  Patient states that she is not better and would like to be reevaluated.     Physical Exam   Triage Vital Signs: ED Triage Vitals  Enc Vitals Group     BP 06/24/22 1456 (!) 147/97     Pulse Rate 06/24/22 1456 (!) 127     Resp 06/24/22 1456 18     Temp 06/24/22 1456 98.3 F (36.8 C)     Temp Source 06/24/22 1456 Oral     SpO2 06/24/22 1456 97 %     Weight 06/24/22 1455 165 lb 5.5 oz (75 kg)     Height 06/24/22 1455 '5\' 2"'$  (1.575 m)     Head Circumference --      Peak Flow --      Pain Score 06/24/22 1455 0     Pain Loc --      Pain Edu? --      Excl. in [place]? --     Most recent vital signs: Vitals:   06/24/22 1456  BP: (!) 147/97  Pulse: (!) 127  Resp: 18  Temp: 98.3 F (36.8 C)  SpO2: 97%     General: Alert and in no acute distress. ENT:      Ears:       Nose: No congestion/rhinnorhea.      Mouth/Throat: Mucous membranes are moist. Neck: No stridor. No cervical spine tenderness to palpation.  Cardiovascular:  Good peripheral perfusion Respiratory: Normal respiratory effort without tachypnea or retractions. Lungs CTAB. Good air entry to the bases with no decreased or absent breath sounds Musculoskeletal: Full range of motion to all extremities.  Neurologic:  No gross focal neurologic deficits are appreciated.  Skin:   No rash noted Other:   ED Results / Procedures / Treatments   Labs (all labs ordered are listed, but only abnormal results are displayed) Labs Reviewed - No data to  display   EKG     RADIOLOGY  I personally viewed, evaluated, and interpreted these images as part of my medical decision making, as well as reviewing the written report by the radiologist.  ED Provider Interpretation: Peribronchial thickening consistent with bronchitis.  No consolidation concerning for pneumonia.  DG Chest 2 View  Result Date: 06/24/2022 CLINICAL DATA:  cough EXAM: CHEST - 2 VIEW COMPARISON:  September 17, 2015 FINDINGS: The cardiomediastinal silhouette is unchanged in contour. No pleural effusion. No pneumothorax. Mild peribronchial cuffing with diffuse bronchial markings. Visualized abdomen is unremarkable. IMPRESSION: Mild peribronchial cuffing with diffuse bronchial markings. Findings are nonspecific but can be seen in the setting of small airways disease/bronchitis. Electronically Signed   By: Valentino Saxon M.D.   On: 06/24/2022 16:26    PROCEDURES:  Critical Care performed: No  Procedures   MEDICATIONS ORDERED IN ED: Medications - No data to display   IMPRESSION / MDM / [place] / ED COURSE  I reviewed the triage vital signs and the nursing notes.  Differential diagnosis includes, but is not limited to, bronchitis, pneumonia, COVID, flu  Patient's presentation is most consistent with acute presentation with potential threat to life or bodily function.   Patient's diagnosis is consistent with bronchitis.  Patient presents emergency department with ongoing symptoms.  Diagnosed with a virus 5 days ago.  Negative COVID test at that time.  Suspected bronchitis but chest x-ray was ordered to ensure no evidence of postviral pneumonia.  This time patient will be placed on a higher dose of steroid, albuterol and may continue to take cough medication at home.  Follow-up primary care as needed.  Return precautions discussed with the patient..  Patient is given ED precautions to return to the ED for any worsening or new  symptoms.        FINAL CLINICAL IMPRESSION(S) / ED DIAGNOSES   Final diagnoses:  Bronchitis     Rx / DC Orders   ED Discharge Orders          Ordered    predniSONE (DELTASONE) 50 MG tablet  Daily with breakfast        06/24/22 1722    albuterol (VENTOLIN HFA) 108 (90 Base) MCG/ACT inhaler  Every 6 hours PRN        06/24/22 1722             Note:  This document was prepared using Dragon voice recognition software and may include unintentional dictation errors.   Darletta Moll, PA-C 06/24/22 1722    Naaman Plummer, MD 06/24/22 1836

## 2022-08-27 ENCOUNTER — Ambulatory Visit
Admission: EM | Admit: 2022-08-27 | Discharge: 2022-08-27 | Disposition: A | Discharge status: Home / Self Care | Payer: Medicaid Other | Attending: Emergency Medicine | Admitting: Emergency Medicine

## 2022-08-27 ENCOUNTER — Ambulatory Visit (INDEPENDENT_AMBULATORY_CARE_PROVIDER_SITE_OTHER): Payer: Medicaid Other

## 2022-08-27 DIAGNOSIS — S92415A Nondisplaced fracture of proximal phalanx of left great toe, initial encounter for closed fracture: Secondary | ICD-10-CM

## 2022-08-27 MED ORDER — IBUPROFEN 800 MG PO TABS: 800.0000 mg | ORAL_TABLET | Freq: Three times a day (TID) | ORAL | 0 refills | Status: DC

## 2022-08-27 NOTE — ED Provider Notes (Signed)
[facility]    CSN: [id number] Arrival date & time: 08/27/22  3151      History   Chief Complaint Chief Complaint  Patient presents with   Toe Injury    HPI [name] is a 46 y.o. female.   Patient presents with pain to the left great toe after dropping a coffee mug onto the toe at approximately 4:30 AM.  Endorses landing onto the nailbed.  Experienced some bleeding to the site which has subsided..  Experiences pain with range of motion but able to complete all movements, able to bear weight.  Denies numbness or tingling.  Has not attempted treatment.  Past Medical History:  Diagnosis Date   Depression    Focal nodular hyperplasia determined by liver biopsy    Leukocytosis    Tobacco use     Patient Active Problem List   Diagnosis Date Noted   Thrombocytosis 05/15/2022   Erythrocytosis 05/15/2022   Status post surgery 08/29/2019   GI bleed 03/23/2018   Leucocytosis 03/04/2016    Past Surgical History:  Procedure Laterality Date   ABDOMINAL HYSTERECTOMY     BREAST BIOPSY Right 2016   [place] core, fat necrosis   COLONOSCOPY WITH PROPOFOL N/A 03/25/2018   Procedure: COLONOSCOPY WITH PROPOFOL;  Surgeon: Toledo, Benay Pike, MD;  Location: [facility];  Service: Gastroenterology;  Laterality: N/A;   ESOPHAGOGASTRODUODENOSCOPY (EGD) WITH PROPOFOL N/A 03/25/2018   Procedure: ESOPHAGOGASTRODUODENOSCOPY (EGD) WITH PROPOFOL;  Surgeon: Toledo, Benay Pike, MD;  Location: [facility];  Service: Gastroenterology;  Laterality: N/A;   INTRAUTERINE DEVICE INSERTION     IUD REMOVAL  08/29/2019   Procedure: INTRAUTERINE DEVICE (IUD) REMOVAL;  Surgeon: Harlin Heys, MD;  Location: [facility];  Service: Gynecology;;   KNEE CARTILAGE SURGERY Right 02/2015   LAPAROSCOPIC ASSISTED VAGINAL HYSTERECTOMY N/A 08/29/2019   Procedure: LAPAROSCOPIC ASSISTED VAGINAL HYSTERECTOMY;  Surgeon: Harlin Heys, MD;  Location: [facility];  Service: Gynecology;  Laterality: N/A;    LAPAROSCOPIC BILATERAL SALPINGO OOPHERECTOMY Bilateral 08/29/2019   Procedure: LAPAROSCOPIC BILATERAL SALPINGO OOPHORECTOMY;  Surgeon: Harlin Heys, MD;  Location: [facility];  Service: Gynecology;  Laterality: Bilateral;   LAPAROSCOPIC TUBAL LIGATION      OB History     Gravida  4   Para  3   Term      Preterm      AB  1   Living         SAB  1   IAB      Ectopic      Multiple      Live Births           Obstetric Comments  1st Menstrual Cycle:  18 1st Pregnancy:  18           Home Medications    Prior to Admission medications   Medication Sig Start Date End Date Taking? Authorizing Provider  gabapentin (NEURONTIN) 300 MG capsule Take by mouth. 03/13/21  Yes [provider]  ibuprofen (ADVIL) 200 MG tablet Take 200 mg by mouth every 6 (six) hours as needed.   Yes [provider]  albuterol (VENTOLIN HFA) 108 (90 Base) MCG/ACT inhaler Inhale 2 puffs into the lungs every 6 (six) hours as needed for wheezing or shortness of breath. 06/24/22   Cuthriell, Charline Bills, PA-C  amoxicillin-clavulanate (AUGMENTIN) 875-125 MG tablet Take 1 tablet by mouth 2 (two) times daily. 06/22/22   Evelina Dun A, FNP  benzonatate (TESSALON) 200 MG capsule Take 1  capsule (200 mg total) by mouth 3 (three) times daily as needed for cough. 06/20/22   Danton Clap, PA-C  buPROPion (WELLBUTRIN XL) 150 MG 24 hr tablet Take 150 mg by mouth daily. 05/06/22   [provider]  cetirizine-pseudoephedrine (ZYRTEC-D) 5-120 MG tablet Take 1 tablet by mouth 2 (two) times daily as needed for rhinitis (Congestion). Patient not taking: Reported on 05/15/2022 06/26/21   Coral Spikes, DO  dextromethorphan 15 MG/5ML syrup Take 10 mLs (30 mg total) by mouth 4 (four) times daily as needed for cough. Patient not taking: Reported on 05/15/2022 10/09/21   Marney Setting, NP  estradiol (ESTRACE) 1 MG tablet Take 1.5 tablets (1.5 mg total) by mouth daily. 03/26/21 06/20/22  Harlin Heys, MD  famotidine (PEPCID) 20 MG tablet Take 1 tablet (20 mg total) by mouth 2 (two) times daily. Patient not taking: Reported on 05/15/2022 12/17/20   Rodriguez-Southworth, Sunday Spillers, PA-C  fluticasone [facility]) 50 MCG/ACT nasal spray Place 1 spray into both nostrils daily. Patient not taking: Reported on 05/15/2022 10/09/21   Marney Setting, NP  ipratropium (ATROVENT) 0.06 % nasal spray Place 2 sprays into both nostrils 4 (four) times daily. 06/20/22   Laurene Footman B, PA-C  melatonin 5 MG TABS Take 5 mg by mouth at bedtime as needed. Patient not taking: Reported on 03/26/2021    [provider]  predniSONE (DELTASONE) 50 MG tablet Take 1 tablet (50 mg total) by mouth daily with breakfast. 06/24/22   Cuthriell, Charline Bills, PA-C  promethazine-dextromethorphan (PROMETHAZINE-DM) 6.25-15 MG/5ML syrup Take 5 mLs by mouth 4 (four) times daily as needed. 06/20/22   Danton Clap, PA-C  traZODone (DESYREL) 50 MG tablet Take 1 tablet (50 mg total) by mouth at bedtime. 06/26/20 08/23/20  Margarette Canada, NP    Family History Family History  Problem Relation Age of Onset   Hypertension Mother    Prostate cancer Father    Hypertension Father    Breast cancer Maternal Grandmother 72   Colon cancer Neg Hx     Social History Social History   Tobacco Use   Smoking status: Every Day    Packs/day: 1.00    Years: 10.00    Total pack years: 10.00    Types: Cigarettes   Smokeless tobacco: Never  Vaping Use   Vaping Use: Never used  Substance Use Topics   Alcohol use: No    Alcohol/week: 0.0 standard drinks of alcohol   Drug use: No     Allergies   Patient has no known allergies.   Review of Systems Review of Systems Defer to HPI    Physical Exam Triage Vital Signs ED Triage Vitals  Enc Vitals Group     BP 08/27/22 0840 (!) 156/96     Pulse Rate 08/27/22 0840 (!) 102     Resp 08/27/22 0840 18     Temp 08/27/22 0840 99.9 F (37.7 C)     Temp Source 08/27/22 0840 Oral      SpO2 08/27/22 0840 99 %     Weight 08/27/22 0838 160 lb (72.6 kg)     Height 08/27/22 0838 '5\' 2"'$  (1.575 m)     Head Circumference --      Peak Flow --      Pain Score 08/27/22 0836 8     Pain Loc --      Pain Edu? --      Excl. in [place]? --    No data found.  Updated Vital Signs BP (!) 156/96 (BP Location: Left Arm)   Pulse (!) 102   Temp 99.9 F (37.7 C) (Oral)   Resp 18   Ht '5\' 2"'$  (1.575 m)   Wt 160 lb (72.6 kg)   LMP 08/22/2019   SpO2 99%   BMI 29.26 kg/m   Visual Acuity Right Eye Distance:   Left Eye Distance:   Bilateral Distance:    Right Eye Near:   Left Eye Near:    Bilateral Near:     Physical Exam Constitutional:      Appearance: Normal appearance.  Eyes:     Extraocular Movements: Extraocular movements intact.  Pulmonary:     Effort: Pulmonary effort is normal.  Musculoskeletal:     Comments: Less than 0.5 cm.  Abrasion present to the proximal nailbed of the left great toe, dried blood present along the proximal and medial nail bed, mild ecchymosis and tenderness noted, nail intact,, sensation intact, capillary refill less than 3, able to bear weight, flexion and extension intact  Neurological:     Mental Status: She is alert and oriented to person, place, and time. Mental status is at baseline.      UC Treatments / Results  Labs (all labs ordered are listed, but only abnormal results are displayed) Labs Reviewed - No data to display  EKG   Radiology No results found.  Procedures Procedures (including critical care time)  Medications Ordered in UC Medications - No data to display  Initial Impression / Assessment and Plan / UC Course  I have reviewed the triage vital signs and the nursing notes.  Pertinent labs & imaging results that were available during my care of the patient were reviewed by me and considered in my medical decision making (see chart for details).  Closed nondisplaced fracture of proximal phalanx of left great toe,  initial encounter  X-ray showing possible fracture, discussed with patient, will move forward as such, buddy tapes first and second toe, advised to use when completing activity, may remove at rest, abrasion on toe cleanse in office by nursing staff, advised to cleanse daily with soap and water, pat dry and place topical over-the-counter antibiotic ointment, may leave open to air or cover at [place] if at risk for further contamination or true orthopedics in 1 to 2 weeks, given information, prescribed ibuprofen 800 mg for management of discomfort, may follow-up with his urgent care as needed Final Clinical Impressions(s) / UC Diagnoses   Final diagnoses:  None   Discharge Instructions   None    ED Prescriptions   None    PDMP not reviewed this encounter.   Hans Eden, NP 08/27/22 1001

## 2022-08-27 NOTE — ED Triage Notes (Signed)
Pt states that she was carrying 2 cups of coffee and dropped it and the rim landed on the edge of her left first toe.  Pt has bleeding allong the base of the toenail on the 1st toe. Pt has bruising along the bottom of the toe.  Pt states this happened at 4:30am  Pt states that there is pain along the toe  Pt denies pain on the foot and ankle.

## 2022-08-27 NOTE — Discharge Instructions (Addendum)
X-ray showing possible fracture to the big toe, therefore we will move forward with treatment as such and have you follow-up with podiatry orthopedics in 1 to 2 weeks for reevaluation  Your second and first tow have been taped together  to give stability and support, wear whenever completing activities, may remove at rest  Your toe has been cleansed in office, there is a small abrasion along the nailbed, this will heal with time, you may cleanse daily during normal hygiene, pat and leave open to air, may also apply topical over-the-counter antibiotic gel

## 2022-09-10 ENCOUNTER — Encounter: Payer: Self-pay | Admitting: Family Medicine

## 2022-09-10 ENCOUNTER — Telehealth: Payer: Medicaid Other | Admitting: Family Medicine

## 2022-09-10 DIAGNOSIS — J069 Acute upper respiratory infection, unspecified: Secondary | ICD-10-CM | POA: Diagnosis not present

## 2022-09-10 MED ORDER — FLUTICASONE PROPIONATE 50 MCG/ACT NA SUSP: 2.0000 | Freq: Every day | NASAL | 0 refills | Status: DC

## 2022-09-10 MED ORDER — BENZONATATE 100 MG PO CAPS: 200.0000 mg | ORAL_CAPSULE | Freq: Three times a day (TID) | ORAL | 0 refills | Status: DC | PRN

## 2022-09-10 NOTE — Progress Notes (Signed)

## 2022-09-16 ENCOUNTER — Telehealth: Payer: Medicaid Other | Admitting: Family Medicine

## 2022-09-16 DIAGNOSIS — M545 Low back pain, unspecified: Secondary | ICD-10-CM

## 2022-09-16 MED ORDER — CYCLOBENZAPRINE HCL 10 MG PO TABS: 10.0000 mg | ORAL_TABLET | Freq: Three times a day (TID) | ORAL | 0 refills | Status: DC | PRN

## 2022-09-16 MED ORDER — NAPROXEN 500 MG PO TABS: 500.0000 mg | ORAL_TABLET | Freq: Two times a day (BID) | ORAL | 0 refills | Status: DC

## 2022-09-16 NOTE — Progress Notes (Signed)

## 2022-09-23 DIAGNOSIS — R42 Dizziness and giddiness: Secondary | ICD-10-CM | POA: Diagnosis not present

## 2022-09-23 DIAGNOSIS — Z0131 Encounter for examination of blood pressure with abnormal findings: Secondary | ICD-10-CM | POA: Diagnosis not present

## 2022-09-23 DIAGNOSIS — I10 Essential (primary) hypertension: Secondary | ICD-10-CM | POA: Insufficient documentation

## 2022-09-23 DIAGNOSIS — Z1389 Encounter for screening for other disorder: Secondary | ICD-10-CM | POA: Diagnosis not present

## 2022-11-09 ENCOUNTER — Telehealth: Payer: Self-pay | Admitting: Family

## 2022-11-09 DIAGNOSIS — J069 Acute upper respiratory infection, unspecified: Secondary | ICD-10-CM

## 2022-11-09 MED ORDER — BENZONATATE 100 MG PO CAPS: 100.0000 mg | ORAL_CAPSULE | Freq: Three times a day (TID) | ORAL | 0 refills | Status: DC | PRN

## 2022-11-09 MED ORDER — FLUTICASONE PROPIONATE 50 MCG/ACT NA SUSP: 2.0000 | Freq: Every day | NASAL | 6 refills | Status: DC

## 2022-11-09 NOTE — Progress Notes (Signed)

## 2022-11-11 DIAGNOSIS — B349 Viral infection, unspecified: Secondary | ICD-10-CM | POA: Diagnosis not present

## 2022-11-24 ENCOUNTER — Other Ambulatory Visit: Payer: Self-pay | Admitting: *Deleted

## 2022-11-24 DIAGNOSIS — D72829 Elevated white blood cell count, unspecified: Secondary | ICD-10-CM

## 2022-11-24 NOTE — Progress Notes (Signed)
cbcc

## 2022-11-25 ENCOUNTER — Inpatient Hospital Stay: Payer: BC Managed Care – PPO | Admitting: Internal Medicine

## 2022-11-25 ENCOUNTER — Inpatient Hospital Stay: Payer: BC Managed Care – PPO

## 2022-12-02 ENCOUNTER — Inpatient Hospital Stay: Payer: BC Managed Care – PPO | Admitting: Internal Medicine

## 2022-12-02 ENCOUNTER — Inpatient Hospital Stay: Payer: BC Managed Care – PPO | Attending: Internal Medicine

## 2022-12-17 ENCOUNTER — Telehealth: Payer: BC Managed Care – PPO | Admitting: Nurse Practitioner

## 2022-12-17 DIAGNOSIS — J069 Acute upper respiratory infection, unspecified: Secondary | ICD-10-CM

## 2022-12-17 MED ORDER — ONDANSETRON HCL 4 MG PO TABS: 4.0000 mg | ORAL_TABLET | Freq: Three times a day (TID) | ORAL | 0 refills | Status: DC | PRN

## 2022-12-17 MED ORDER — BENZONATATE 100 MG PO CAPS: 100.0000 mg | ORAL_CAPSULE | Freq: Three times a day (TID) | ORAL | 0 refills | Status: DC | PRN

## 2022-12-17 NOTE — Progress Notes (Signed)
E-Visit for Upper Respiratory Infection   We are sorry you are not feeling well.  Here is how we plan to help!  Based on what you have shared with me, it looks like you may have a viral upper respiratory infection.  Upper respiratory infections are caused by a large number of viruses; however, rhinovirus is the most common cause.   Symptoms vary from person to person, with common symptoms including sore throat, cough, fatigue or lack of energy and feeling of general discomfort.  A low-grade fever of up to 100.4 may present, but is often uncommon.  Symptoms vary however, and are closely related to a person's age or underlying illnesses.  The most common symptoms associated with an upper respiratory infection are nasal discharge or congestion, cough, sneezing, headache and pressure in the ears and face.  These symptoms usually persist for about 3 to 10 days, but can last up to 2 weeks.  It is important to know that upper respiratory infections do not cause serious illness or complications in most cases.    Upper respiratory infections can be transmitted from person to person, with the most common method of transmission being a person's hands.  The virus is able to live on the skin and can infect other persons for up to 2 hours after direct contact.  Also, these can be transmitted when someone coughs or sneezes; thus, it is important to cover the mouth to reduce this risk.  To keep the spread of the illness at [place], good hand hygiene is very important.  This is an infection that is most likely caused by a virus. There are no specific treatments other than to help you with the symptoms until the infection runs its course.  We are sorry you are not feeling well.  Here is how we plan to help!   For nasal congestion, you may use an oral decongestants such as Mucinex D or if you have glaucoma or high blood pressure use plain Mucinex.  Saline nasal spray or nasal drops can help and can safely be used as often as  needed for congestion.  For your congestion you can continue to use over the counter cold/flu medications as directed and your Flonase nasal spray.   If you do not have a history of heart disease, hypertension, diabetes or thyroid disease, prostate/bladder issues or glaucoma, you may also use Sudafed to treat nasal congestion.  It is highly recommended that you consult with a pharmacist or your primary care physician to ensure this medication is safe for you to take.     If you have a cough, you may use cough suppressants such as Delsym and Robitussin.  If you have glaucoma or high blood pressure, you can also use Coricidin HBP.   For cough I have prescribed for you A prescription cough medication called Tessalon Perles 100 mg. You may take 1-2 capsules every 8 hours as needed for cough We will also call in something for your nausea to assure you can stay hydrated.   Meds ordered this encounter  Medications   ondansetron (ZOFRAN) 4 MG tablet    Sig: Take 1 tablet (4 mg total) by mouth every 8 (eight) hours as needed for nausea or vomiting.    Dispense:  20 tablet    Refill:  0   benzonatate (TESSALON) 100 MG capsule    Sig: Take 1 capsule (100 mg total) by mouth 3 (three) times daily as needed.    Dispense:  30  capsule    Refill:  0     If you have a sore or scratchy throat, use a saltwater gargle-  to  teaspoon of salt dissolved in a 4-ounce to 8-ounce glass of warm water.  Gargle the solution for approximately 15-30 seconds and then spit.  It is important not to swallow the solution.  You can also use throat lozenges/cough drops and Chloraseptic spray to help with throat pain or discomfort.  Warm or cold liquids can also be helpful in relieving throat pain.  For headache, pain or general discomfort, you can use Ibuprofen or Tylenol as directed.   Some authorities believe that zinc sprays or the use of Echinacea may shorten the course of your symptoms.   HOME CARE Only take medications  as instructed by your medical team. Be sure to drink plenty of fluids. Water is fine as well as fruit juices, sodas and electrolyte beverages. You may want to stay away from caffeine or alcohol. If you are nauseated, try taking small sips of liquids. How do you know if you are getting enough fluid? Your urine should be a pale yellow or almost colorless. Get rest. Taking a steamy shower or using a humidifier may help nasal congestion and ease sore throat pain. You can place a towel over your head and breathe in the steam from hot water coming from a faucet. Using a saline nasal spray works much the same way. Cough drops, hard candies and sore throat lozenges may ease your cough. Avoid close contacts especially the very young and the elderly Cover your mouth if you cough or sneeze Always remember to wash your hands.   GET HELP RIGHT AWAY IF: You develop worsening fever. If your symptoms do not improve within 10 days You develop yellow or green discharge from your nose over 3 days. You have coughing fits You develop a severe head ache or visual changes. You develop shortness of breath, difficulty breathing or start having chest pain Your symptoms persist after you have completed your treatment plan  MAKE SURE YOU  Understand these instructions. Will watch your condition. Will get help right away if you are not doing well or get worse.  Thank you for choosing an e-visit.  Your e-visit answers were reviewed by a board certified advanced clinical practitioner to complete your personal care plan. Depending upon the condition, your plan could have included both over the counter or prescription medications.  Please review your pharmacy choice. Make sure the pharmacy is open so you can pick up prescription now. If there is a problem, you may contact your provider through [employer] and have the prescription routed to another pharmacy.  Your safety is important to [place]. If you have drug  allergies check your prescription carefully.   For the next 24 hours you can use MyChart to ask questions about today's visit, request a non-urgent call back, or ask for a work or school excuse. You will get an email in the next two days asking about your experience. I hope that your e-visit has been valuable and will speed your recovery.  I spent approximately 5 minutes reviewing the patient's history, current symptoms and coordinating their care today.

## 2023-01-05 DIAGNOSIS — B349 Viral infection, unspecified: Secondary | ICD-10-CM | POA: Diagnosis not present

## 2023-03-06 DIAGNOSIS — K5900 Constipation, unspecified: Secondary | ICD-10-CM | POA: Diagnosis not present

## 2023-03-06 DIAGNOSIS — R030 Elevated blood-pressure reading, without diagnosis of hypertension: Secondary | ICD-10-CM | POA: Diagnosis not present

## 2023-03-06 DIAGNOSIS — Z1389 Encounter for screening for other disorder: Secondary | ICD-10-CM | POA: Diagnosis not present

## 2023-03-06 DIAGNOSIS — I10 Essential (primary) hypertension: Secondary | ICD-10-CM | POA: Diagnosis not present

## 2023-03-06 DIAGNOSIS — Z0130 Encounter for examination of blood pressure without abnormal findings: Secondary | ICD-10-CM | POA: Diagnosis not present

## 2023-03-06 DIAGNOSIS — M79604 Pain in right leg: Secondary | ICD-10-CM | POA: Diagnosis not present

## 2023-03-10 DIAGNOSIS — E781 Pure hyperglyceridemia: Secondary | ICD-10-CM | POA: Insufficient documentation

## 2023-04-06 DIAGNOSIS — U071 COVID-19: Secondary | ICD-10-CM | POA: Diagnosis not present

## 2023-04-15 DIAGNOSIS — I10 Essential (primary) hypertension: Secondary | ICD-10-CM | POA: Diagnosis not present

## 2023-04-15 DIAGNOSIS — E781 Pure hyperglyceridemia: Secondary | ICD-10-CM | POA: Diagnosis not present

## 2023-06-05 DIAGNOSIS — K0889 Other specified disorders of teeth and supporting structures: Secondary | ICD-10-CM | POA: Diagnosis not present

## 2023-06-05 DIAGNOSIS — K029 Dental caries, unspecified: Secondary | ICD-10-CM | POA: Insufficient documentation

## 2023-06-05 DIAGNOSIS — Z1389 Encounter for screening for other disorder: Secondary | ICD-10-CM | POA: Diagnosis not present

## 2023-06-05 DIAGNOSIS — Z0131 Encounter for examination of blood pressure with abnormal findings: Secondary | ICD-10-CM | POA: Diagnosis not present

## 2023-06-25 ENCOUNTER — Encounter: Payer: Self-pay | Admitting: Emergency Medicine

## 2023-06-25 ENCOUNTER — Emergency Department
Admission: EM | Admit: 2023-06-25 | Discharge: 2023-06-25 | Disposition: A | Discharge status: Home / Self Care | Payer: BC Managed Care – PPO | Attending: Emergency Medicine | Admitting: Emergency Medicine

## 2023-06-25 ENCOUNTER — Other Ambulatory Visit: Payer: Self-pay

## 2023-06-25 ENCOUNTER — Emergency Department: Payer: BC Managed Care – PPO

## 2023-06-25 DIAGNOSIS — S7001XA Contusion of right hip, initial encounter: Secondary | ICD-10-CM | POA: Diagnosis not present

## 2023-06-25 DIAGNOSIS — M79604 Pain in right leg: Secondary | ICD-10-CM | POA: Diagnosis not present

## 2023-06-25 DIAGNOSIS — W010XXA Fall on same level from slipping, tripping and stumbling without subsequent striking against object, initial encounter: Secondary | ICD-10-CM | POA: Diagnosis not present

## 2023-06-25 DIAGNOSIS — W19XXXA Unspecified fall, initial encounter: Secondary | ICD-10-CM

## 2023-06-25 DIAGNOSIS — S79911A Unspecified injury of right hip, initial encounter: Secondary | ICD-10-CM | POA: Diagnosis not present

## 2023-06-25 DIAGNOSIS — M25561 Pain in right knee: Secondary | ICD-10-CM | POA: Insufficient documentation

## 2023-06-25 DIAGNOSIS — M25551 Pain in right hip: Secondary | ICD-10-CM | POA: Insufficient documentation

## 2023-06-25 DIAGNOSIS — Z043 Encounter for examination and observation following other accident: Secondary | ICD-10-CM | POA: Diagnosis not present

## 2023-06-25 MED ORDER — LIDOCAINE 5 % EX PTCH
1.0000 | MEDICATED_PATCH | CUTANEOUS | Status: DC
Start: 2023-06-25 — End: 2023-06-25
  Administered 2023-06-25: 1 via TRANSDERMAL
  Filled 2023-06-25: qty 1

## 2023-06-25 MED ORDER — ACETAMINOPHEN 325 MG PO TABS
650.0000 mg | ORAL_TABLET | Freq: Once | ORAL | Status: AC
  Administered 2023-06-25: 650 mg via ORAL
  Filled 2023-06-25: qty 2

## 2023-06-25 NOTE — ED Provider Notes (Signed)
[facility] Provider Note    Event Date/Time   First MD Initiated Contact with Patient 06/25/23 1002     (approximate)   History   Fall   HPI  [name] is a 47 y.o. female who presents today for evaluation of right leg pain.  Patient reports that she had a slip and fall on wet grass this morning.  She reports that she landed on her right hip.  She reports that she has pain in her hip and thigh area.  She has been able to ambulate, though reports that she has pain with ambulation.  There is no head strike or LOC.  She reports that she had knee and ankle pain when this happened but this has resolved.  No paresthesias.  She has not noticed any skin changes.  Patient Active Problem List   Diagnosis Date Noted   Thrombocytosis 05/15/2022   Erythrocytosis 05/15/2022   Status post surgery 08/29/2019   GI bleed 03/23/2018   Leucocytosis 03/04/2016          Physical Exam   Triage Vital Signs: ED Triage Vitals  Encounter Vitals Group     BP 06/25/23 0953 (!) 159/88     Systolic BP Percentile --      Diastolic BP Percentile --      Pulse Rate 06/25/23 0953 (!) 103     Resp 06/25/23 0953 16     Temp 06/25/23 0953 98.6 F (37 C)     Temp Source 06/25/23 0953 Oral     SpO2 06/25/23 0953 98 %     Weight 06/25/23 0954 160 lb 0.9 oz (72.6 kg)     Height 06/25/23 1005 5\' 2"  (1.575 m)     Head Circumference --      Peak Flow --      Pain Score 06/25/23 0953 8     Pain Loc --      Pain Education --      Exclude from Growth Chart --     Most recent vital signs: Vitals:   06/25/23 0953  BP: (!) 159/88  Pulse: (!) 103  Resp: 16  Temp: 98.6 F (37 C)  SpO2: 98%    Physical Exam Vitals and nursing note reviewed.  Constitutional:      General: Awake and alert. No acute distress.    Appearance: Normal appearance. The patient is normal weight.  HENT:     Head: Normocephalic and atraumatic.     Mouth: Mucous membranes are moist.  Eyes:      General: PERRL. Normal EOMs        Right eye: No discharge.        Left eye: No discharge.     Conjunctiva/sclera: Conjunctivae normal.  Cardiovascular:     Rate and Rhythm: Normal rate and regular rhythm.     Pulses: Normal pulses.  Pulmonary:     Effort: Pulmonary effort is normal. No respiratory distress.     Breath sounds: Normal breath sounds.  Abdominal:     Abdomen is soft. There is no abdominal tenderness. No rebound or guarding. No distention. Musculoskeletal:        General: No swelling. Normal range of motion.     Cervical back: Normal range of motion and neck supple.  Pelvis stable.  Tenderness palpation to the right greater trochanter.  There is no erythema or ecchymosis.  No hematoma.  Sensation intact throughout lower extremity and equal to opposite.  2+ pedal  pulse.  Normal strength and sensation to bilateral feet.  Able to plantarflex and dorsiflex at the ankle, full and normal range of motion at the knee, normal range of motion of the hip. Skin:    General: Skin is warm and dry.     Capillary Refill: Capillary refill takes less than 2 seconds.     Findings: No rash.  Neurological:     Mental Status: The patient is awake and alert.      ED Results / Procedures / Treatments   Labs (all labs ordered are listed, but only abnormal results are displayed) Labs Reviewed - No data to display   EKG     RADIOLOGY I independently reviewed and interpreted imaging and agree with radiologists findings.     PROCEDURES:  Critical Care performed:   Procedures   MEDICATIONS ORDERED IN ED: Medications  lidocaine (LIDODERM) 5 % 1 patch (1 patch Transdermal Patch Applied 06/25/23 1045)  acetaminophen (TYLENOL) tablet 650 mg (650 mg Oral Given 06/25/23 1045)     IMPRESSION / MDM / ASSESSMENT AND PLAN / ED COURSE  I reviewed the triage vital signs and the nursing notes.   Differential diagnosis includes, but is not limited to, contusion, fracture, less likely  dislocation.  Patient is awake and alert, hemodynamically stable and afebrile.  She has no evidence of hematoma or ecchymosis, no erythema.  She is neurovascularly intact with normal strength and sensation throughout her lower extremity and equal to opposite, normal pedal pulses, normal capillary refill, do not suspect vascular injury.  X-ray obtained of her hip which is the location of her pain.  This did not reveal any acute findings.  Patient is ambulatory with a steady gait.  We discussed return precautions and outpatient follow-up.  Patient understands and agrees with plan.  She was given a work note per her request.  Recommended close outpatient follow-up and strict return precautions.  Patient was discharged in stable condition.   Patient's presentation is most consistent with acute complicated illness / injury requiring diagnostic workup.    FINAL CLINICAL IMPRESSION(S) / ED DIAGNOSES   Final diagnoses:  Fall, initial encounter  Contusion of right hip, initial encounter     Rx / DC Orders   ED Discharge Orders     None        Note:  This document was prepared using Dragon voice recognition software and may include unintentional dictation errors.   Keturah Shavers 06/25/23 1220    Sharman Cheek, MD 06/25/23 [phone]

## 2023-06-25 NOTE — Discharge Instructions (Addendum)
Your x-ray was normal.  You may follow-up with your outpatient provider.  You may continue to take Tylenol/ibuprofen per package instructions as needed for pain.  Please return for any new, worsening, or change in symptoms or other concerns.  It was a pleasure caring for you today.

## 2023-06-25 NOTE — ED Notes (Signed)
See triage note  Presents with pain to right upper thigh  States she slipped but did not fall  Ambulates with slight limp

## 2023-06-25 NOTE — ED Triage Notes (Signed)
Slipped on wet grass this morning. C/O righ thip, upper leg, and knee pain.

## 2023-07-01 DIAGNOSIS — S76011A Strain of muscle, fascia and tendon of right hip, initial encounter: Secondary | ICD-10-CM | POA: Diagnosis not present

## 2023-07-23 DIAGNOSIS — R600 Localized edema: Secondary | ICD-10-CM | POA: Insufficient documentation

## 2023-07-28 ENCOUNTER — Telehealth: Payer: BC Managed Care – PPO | Admitting: Physician Assistant

## 2023-07-28 DIAGNOSIS — K644 Residual hemorrhoidal skin tags: Secondary | ICD-10-CM | POA: Diagnosis not present

## 2023-07-28 MED ORDER — HYDROCORTISONE ACETATE 25 MG RE SUPP: 25.0000 mg | Freq: Two times a day (BID) | RECTAL | 0 refills | Status: DC

## 2023-07-28 NOTE — Progress Notes (Signed)

## 2023-08-10 ENCOUNTER — Telehealth: Payer: BC Managed Care – PPO | Admitting: Family Medicine

## 2023-08-10 DIAGNOSIS — J029 Acute pharyngitis, unspecified: Secondary | ICD-10-CM

## 2023-08-10 DIAGNOSIS — R6889 Other general symptoms and signs: Secondary | ICD-10-CM

## 2023-08-10 MED ORDER — PSEUDOEPH-BROMPHEN-DM 30-2-10 MG/5ML PO SYRP: 5.0000 mL | ORAL_SOLUTION | Freq: Three times a day (TID) | ORAL | 0 refills | Status: DC | PRN

## 2023-08-10 MED ORDER — LIDOCAINE VISCOUS HCL 2 % MT SOLN: 15.0000 mL | OROMUCOSAL | 0 refills | Status: DC | PRN

## 2023-08-10 MED ORDER — FLUTICASONE PROPIONATE 50 MCG/ACT NA SUSP: 2.0000 | Freq: Every day | NASAL | 0 refills | Status: DC

## 2023-08-10 NOTE — Progress Notes (Signed)
E visit for Flu like symptoms   We are sorry that you are not feeling well.  Here is how we plan to help! Based on what you have shared with me it looks like you may have flu-like symptoms that should be watched but do not seem to indicate anti-viral treatment.  Influenza or "the flu" is   an infection caused by a respiratory virus. The flu virus is highly contagious and persons who did not receive their yearly flu vaccination may "catch" the flu from close contact.  We have anti-viral medications to treat the viruses that cause this infection. They are not a "cure" and only shorten the course of the infection. These prescriptions are most effective when they are given within the first 2 days of "flu" symptoms. Antiviral medication are indicated if you have a high risk of complications from the flu.    Based upon your symptoms and potential risk factors I recommend that you follow the flu symptoms recommendation that I have listed below.  I will order you a cough and congestion syrup. I will order a sore throat swish to help numb your throat as well. Continue to use the nasal spray- Flonase   ANYONE WHO HAS FLU SYMPTOMS SHOULD: Stay home. The flu is highly contagious and going out or to work exposes others! Be sure to drink plenty of fluids. Water is fine as well as fruit juices, sodas and electrolyte beverages. You may want to stay away from caffeine or alcohol. If you are nauseated, try taking small sips of liquids. How do you know if you are getting enough fluid? Your urine should be a pale yellow or almost colorless. Get rest. Taking a steamy shower or using a humidifier may help nasal congestion and ease sore throat pain. Using a saline nasal spray works much the same way. Cough drops, hard candies and sore throat lozenges may ease your cough. Line up a caregiver. Have someone check on you regularly.   GET HELP RIGHT AWAY IF: You cannot keep down liquids or your medications. You  become short of breath Your fell like you are going to pass out or loose consciousness. Your symptoms persist after you have completed your treatment plan MAKE SURE YOU  Understand these instructions. Will watch your condition. Will get help right away if you are not doing well or get worse.  Your e-visit answers were reviewed by a board certified advanced clinical practitioner to complete your personal care plan.  Depending on the condition, your plan could have included both over the counter or prescription medications.  If there is a problem please reply  once you have received a response from your provider.  Your safety is important to [place].  If you have drug allergies check your prescription carefully.    You can use MyChart to ask questions about today's visit, request a non-urgent call back, or ask for a work or school excuse for 24 hours related to this e-Visit. If it has been greater than 24 hours you will need to follow up with your provider, or enter a new e-Visit to address those concerns.  You will get an e-mail in the next two days asking about your experience.  I hope that your e-visit has been valuable and will speed your recovery. Thank you for using e-visits.  I provided 5 minutes of non face-to-face time during this encounter for chart review, medication and order placement, as well as and documentation.

## 2023-08-13 DIAGNOSIS — J069 Acute upper respiratory infection, unspecified: Secondary | ICD-10-CM | POA: Diagnosis not present

## 2023-08-13 DIAGNOSIS — Z0131 Encounter for examination of blood pressure with abnormal findings: Secondary | ICD-10-CM | POA: Diagnosis not present

## 2023-08-13 DIAGNOSIS — Z1389 Encounter for screening for other disorder: Secondary | ICD-10-CM | POA: Diagnosis not present

## 2023-09-06 ENCOUNTER — Encounter: Payer: Self-pay | Admitting: Emergency Medicine

## 2023-09-06 ENCOUNTER — Ambulatory Visit
Admission: EM | Admit: 2023-09-06 | Discharge: 2023-09-06 | Disposition: A | Discharge status: Home / Self Care | Payer: BC Managed Care – PPO | Attending: Emergency Medicine | Admitting: Emergency Medicine

## 2023-09-06 DIAGNOSIS — J0140 Acute pansinusitis, unspecified: Secondary | ICD-10-CM

## 2023-09-06 MED ORDER — IPRATROPIUM BROMIDE 0.06 % NA SOLN: 2.0000 | Freq: Four times a day (QID) | NASAL | 12 refills | Status: DC

## 2023-09-06 MED ORDER — AMOXICILLIN-POT CLAVULANATE 875-125 MG PO TABS: 1.0000 | ORAL_TABLET | Freq: Two times a day (BID) | ORAL | 0 refills | Status: AC

## 2023-09-06 MED ORDER — BENZONATATE 100 MG PO CAPS: 200.0000 mg | ORAL_CAPSULE | Freq: Three times a day (TID) | ORAL | 0 refills | Status: DC

## 2023-09-06 MED ORDER — PROMETHAZINE-DM 6.25-15 MG/5ML PO SYRP
5.0000 mL | ORAL_SOLUTION | Freq: Four times a day (QID) | ORAL | 0 refills | Status: DC | PRN
Start: 2023-09-06 — End: 2023-11-17

## 2023-09-06 NOTE — Discharge Instructions (Addendum)
The Augmentin twice daily with food for 10 days for treatment of your sinusitis.  Perform sinus irrigation 2-3 times a day with a NeilMed sinus rinse kit and distilled water.  Do not use tap water.  You can use plain over-the-counter Mucinex every 6 hours to break up the stickiness of the mucus so your body can clear it.  Increase your oral fluid intake to thin out your mucus so that is also able for your body to clear more easily.  Take an over-the-counter probiotic, such as Culturelle-align-activia, 1 hour after each dose of antibiotic to prevent diarrhea.  Use the Atrovent nasal spray, 2 squirts in each nostril every 6 hours, as needed for runny nose and postnasal drip.  Use the Tessalon Perles every 8 hours during the day.  Take them with a small sip of water.  They may give you some numbness to the base of your tongue or a metallic taste in your mouth, this is normal.  Use the Promethazine DM cough syrup at bedtime for cough and congestion.  It will make you drowsy so do not take it during the day.  Return for reevaluation or see your primary care provider for any new or worsening symptoms.

## 2023-09-06 NOTE — ED Triage Notes (Signed)
Patient reports cough, chest congestion, runny nose, and nasal congestion that started 2 weeks ago.  Patient reports sinus pressure and left ear pain that started on Thursday.  Patient unsure of fevers.

## 2023-09-06 NOTE — ED Provider Notes (Signed)
[facility]    CSN: [id number] Arrival date & time: 09/06/23  0802      History   Chief Complaint Chief Complaint  Patient presents with   Cough   Nasal Congestion    HPI [name] is a 47 y.o. female.   HPI  47 year old female with past medical history significant for leukocytosis, focal nodular hyperplasia, and depression presents for evaluation of 2 weeks with the respiratory symptoms.  Her symptoms began with runny nose and nasal congestion for yellow nasal discharge and a cough that is productive for yellow sputum 2 weeks ago.  In the last 3 days she has developed sinus pressure, left ear pain, subjective fever, and wheezing.  She recently started on 2 medications, 1 of which is Wellbutrin, for smoking cessation.  Patient works as an Associate Professor.  Past Medical History:  Diagnosis Date   Depression    Focal nodular hyperplasia determined by liver biopsy    Leukocytosis    Tobacco use     Patient Active Problem List   Diagnosis Date Noted   Thrombocytosis 05/15/2022   Erythrocytosis 05/15/2022   Status post surgery 08/29/2019   GI bleed 03/23/2018   Leucocytosis 03/04/2016    Past Surgical History:  Procedure Laterality Date   ABDOMINAL HYSTERECTOMY     BREAST BIOPSY Right 2016   [place] core, fat necrosis   COLONOSCOPY WITH PROPOFOL N/A 03/25/2018   Procedure: COLONOSCOPY WITH PROPOFOL;  Surgeon: Toledo, Boykin Nearing, MD;  Location: [facility];  Service: Gastroenterology;  Laterality: N/A;   ESOPHAGOGASTRODUODENOSCOPY (EGD) WITH PROPOFOL N/A 03/25/2018   Procedure: ESOPHAGOGASTRODUODENOSCOPY (EGD) WITH PROPOFOL;  Surgeon: Toledo, Boykin Nearing, MD;  Location: [facility];  Service: Gastroenterology;  Laterality: N/A;   INTRAUTERINE DEVICE INSERTION     IUD REMOVAL  08/29/2019   Procedure: INTRAUTERINE DEVICE (IUD) REMOVAL;  Surgeon: Linzie Collin, MD;  Location: [facility];  Service: Gynecology;;   KNEE CARTILAGE SURGERY Right 02/2015    LAPAROSCOPIC ASSISTED VAGINAL HYSTERECTOMY N/A 08/29/2019   Procedure: LAPAROSCOPIC ASSISTED VAGINAL HYSTERECTOMY;  Surgeon: Linzie Collin, MD;  Location: [facility];  Service: Gynecology;  Laterality: N/A;   LAPAROSCOPIC BILATERAL SALPINGO OOPHERECTOMY Bilateral 08/29/2019   Procedure: LAPAROSCOPIC BILATERAL SALPINGO OOPHORECTOMY;  Surgeon: Linzie Collin, MD;  Location: [facility];  Service: Gynecology;  Laterality: Bilateral;   LAPAROSCOPIC TUBAL LIGATION      OB History     Gravida  4   Para  3   Term      Preterm      AB  1   Living         SAB  1   IAB      Ectopic      Multiple      Live Births           Obstetric Comments  1st Menstrual Cycle:  18 1st Pregnancy:  18           Home Medications    Prior to Admission medications   Medication Sig Start Date End Date Taking? Authorizing Provider  amoxicillin-clavulanate (AUGMENTIN) 875-125 MG tablet Take 1 tablet by mouth every 12 (twelve) hours for 10 days. 09/06/23 09/16/23 Yes Becky Augusta, NP  benzonatate (TESSALON) 100 MG capsule Take 2 capsules (200 mg total) by mouth every 8 (eight) hours. 09/06/23  Yes Becky Augusta, NP  ipratropium (ATROVENT) 0.06 % nasal spray Place 2 sprays into both nostrils 4 (four) times daily. 09/06/23  Yes Rebeccah Ivins,  Riki Rusk, NP  promethazine-dextromethorphan (PROMETHAZINE-DM) 6.25-15 MG/5ML syrup Take 5 mLs by mouth 4 (four) times daily as needed. 09/06/23  Yes Becky Augusta, NP  fluticasone (FLONASE) 50 MCG/ACT nasal spray Place 2 sprays into both nostrils daily. 08/10/23   [name], NP  gabapentin (NEURONTIN) 300 MG capsule Take by mouth. 03/13/21   [provider]  hydrocortisone (ANUSOL-HC) 25 MG suppository Place 1 suppository (25 mg total) rectally 2 (two) times daily. 07/28/23   Margaretann Loveless, PA-C  lidocaine (XYLOCAINE) 2 % solution Use as directed 15 mLs in the mouth or throat as needed for mouth pain. 08/10/23   [name], NP  melatonin 5  MG TABS Take 5 mg by mouth at bedtime as needed. Patient not taking: Reported on 03/26/2021    [provider]  naproxen (NAPROSYN) 500 MG tablet Take 1 tablet (500 mg total) by mouth 2 (two) times daily with a meal. 09/16/22   [name], NP  traZODone (DESYREL) 50 MG tablet Take 1 tablet (50 mg total) by mouth at bedtime. 06/26/20 08/23/20  Becky Augusta, NP    Family History Family History  Problem Relation Age of Onset   Hypertension Mother    Prostate cancer Father    Hypertension Father    Breast cancer Maternal Grandmother 63   Colon cancer Neg Hx     Social History Social History   Tobacco Use   Smoking status: Every Day    Current packs/day: 1.00    Average packs/day: 1 pack/day for 10.0 years (10.0 ttl pk-yrs)    Types: Cigarettes   Smokeless tobacco: Never  Vaping Use   Vaping status: Never Used  Substance Use Topics   Alcohol use: No    Alcohol/week: 0.0 standard drinks of alcohol   Drug use: No     Allergies   Patient has no known allergies.   Review of Systems Review of Systems  Constitutional:  Positive for fever.  HENT:  Positive for congestion, ear pain, rhinorrhea and sinus pain. Negative for sore throat.   Respiratory:  Positive for cough and wheezing. Negative for shortness of breath.      Physical Exam Triage Vital Signs ED Triage Vitals  Encounter Vitals Group     BP      Systolic BP Percentile      Diastolic BP Percentile      Pulse      Resp      Temp      Temp src      SpO2      Weight      Height      Head Circumference      Peak Flow      Pain Score      Pain Loc      Pain Education      Exclude from Growth Chart    No data found.  Updated Vital Signs BP (!) 156/99 (BP Location: Left Arm)   Pulse 94   Temp 98.5 F (36.9 C) (Oral)   Resp 15   Ht 5\' 2"  (1.575 m)   Wt 160 lb 0.9 oz (72.6 kg)   LMP 08/22/2019   SpO2 98%   BMI 29.27 kg/m   Visual Acuity Right Eye Distance:   Left Eye Distance:    Bilateral Distance:    Right Eye Near:   Left Eye Near:    Bilateral Near:     Physical Exam Vitals and nursing note reviewed.  Constitutional:  Appearance: Normal appearance. She is not ill-appearing.  HENT:     Head: Normocephalic and atraumatic.     Right Ear: Tympanic membrane, ear canal and external ear normal. There is no impacted cerumen.     Left Ear: Tympanic membrane, ear canal and external ear normal. There is no impacted cerumen.     Nose: Congestion and rhinorrhea present.     Comments: Nasal mucosa is edematous with yellow discharge in both nares.  Marked tenderness to compression of bilateral frontal and maxillary sinuses.    Mouth/Throat:     Mouth: Mucous membranes are moist.     Pharynx: Oropharynx is clear. No oropharyngeal exudate or posterior oropharyngeal erythema.  Neck:     Comments: Bilateral anterior, tender cervical lymphadenopathy. Cardiovascular:     Rate and Rhythm: Normal rate and regular rhythm.     Pulses: Normal pulses.     Heart sounds: Normal heart sounds. No murmur heard.    No friction rub. No gallop.  Pulmonary:     Effort: Pulmonary effort is normal.     Breath sounds: Normal breath sounds. No wheezing, rhonchi or rales.  Musculoskeletal:     Cervical back: Normal range of motion and neck supple. Tenderness present.  Lymphadenopathy:     Cervical: Cervical adenopathy present.  Skin:    General: Skin is warm and dry.     Capillary Refill: Capillary refill takes less than 2 seconds.     Findings: No rash.  Neurological:     General: No focal deficit present.     Mental Status: She is alert and oriented to person, place, and time.      UC Treatments / Results  Labs (all labs ordered are listed, but only abnormal results are displayed) Labs Reviewed - No data to display  EKG   Radiology No results found.  Procedures Procedures (including critical care time)  Medications Ordered in UC Medications - No data to  display  Initial Impression / Assessment and Plan / UC Course  I have reviewed the triage vital signs and the nursing notes.  Pertinent labs & imaging results that were available during my care of the patient were reviewed by me and considered in my medical decision making (see chart for details).   Patient is a nontoxic-appearing 47 year old female presenting for evaluation of 2 weeks of respiratory symptoms as outlined HPI above.  Her physical exam reveals inflammation of her nasal mucosa with yellow nasal discharge and tenderness to compression of bilateral frontal and maxillary sinuses.  Her cardiopulmonary exam is benign.  The patient works in home health and she has taken COVID and flu testing that were both negative.  Given the patient's symptoms for 2 weeks I will treat her for pansinusitis with Augmentin 875 twice daily for 10 days.  We also discussed using sinus irrigation to help alleviate her mucus burden.  Atrovent nasal spray to help with nasal congestion and Tessalon Perles and Promethazine DM cough syrup for cough and congestion.  Return precautions reviewed.   Final Clinical Impressions(s) / UC Diagnoses   Final diagnoses:  Acute non-recurrent pansinusitis     Discharge Instructions      The Augmentin twice daily with food for 10 days for treatment of your sinusitis.  Perform sinus irrigation 2-3 times a day with a NeilMed sinus rinse kit and distilled water.  Do not use tap water.  You can use plain over-the-counter Mucinex every 6 hours to break up the stickiness of the mucus  so your body can clear it.  Increase your oral fluid intake to thin out your mucus so that is also able for your body to clear more easily.  Take an over-the-counter probiotic, such as Culturelle-align-activia, 1 hour after each dose of antibiotic to prevent diarrhea.  Use the Atrovent nasal spray, 2 squirts in each nostril every 6 hours, as needed for runny nose and postnasal drip.  Use the  Tessalon Perles every 8 hours during the day.  Take them with a small sip of water.  They may give you some numbness to the base of your tongue or a metallic taste in your mouth, this is normal.  Use the Promethazine DM cough syrup at bedtime for cough and congestion.  It will make you drowsy so do not take it during the day.  Return for reevaluation or see your primary care provider for any new or worsening symptoms.      ED Prescriptions     Medication Sig Dispense Auth. Provider   amoxicillin-clavulanate (AUGMENTIN) 875-125 MG tablet Take 1 tablet by mouth every 12 (twelve) hours for 10 days. 20 tablet Becky Augusta, NP   benzonatate (TESSALON) 100 MG capsule Take 2 capsules (200 mg total) by mouth every 8 (eight) hours. 21 capsule Becky Augusta, NP   ipratropium (ATROVENT) 0.06 % nasal spray Place 2 sprays into both nostrils 4 (four) times daily. 15 mL Becky Augusta, NP   promethazine-dextromethorphan (PROMETHAZINE-DM) 6.25-15 MG/5ML syrup Take 5 mLs by mouth 4 (four) times daily as needed. 118 mL Becky Augusta, NP      PDMP not reviewed this encounter.   Becky Augusta, NP 09/06/23 [phone]

## 2023-09-21 ENCOUNTER — Telehealth: Payer: BC Managed Care – PPO | Admitting: Physician Assistant

## 2023-09-21 DIAGNOSIS — R6889 Other general symptoms and signs: Secondary | ICD-10-CM

## 2023-09-21 MED ORDER — OSELTAMIVIR PHOSPHATE 75 MG PO CAPS: 75.0000 mg | ORAL_CAPSULE | Freq: Two times a day (BID) | ORAL | 0 refills | Status: DC

## 2023-09-21 NOTE — Progress Notes (Signed)
E visit for Flu like symptoms   We are sorry that you are not feeling well.  Here is how we plan to help! Based on what you have shared with me it looks like you may have a respiratory virus that may be influenza.  Influenza or "the flu" is   an infection caused by a respiratory virus. The flu virus is highly contagious and persons who did not receive their yearly flu vaccination may "catch" the flu from close contact.  We have anti-viral medications to treat the viruses that cause this infection. They are not a "cure" and only shorten the course of the infection. These prescriptions are most effective when they are given within the first 2 days of "flu" symptoms. Antiviral medication are indicated if you have a high risk of complications from the flu. You should  also consider an antiviral medication if you are in close contact with someone who is at risk. These medications can help patients avoid complications from the flu  but have side effects that you should know. Possible side effects from Tamiflu or oseltamivir include nausea, vomiting, diarrhea, dizziness, headaches, eye redness, sleep problems or other respiratory symptoms. You should not take Tamiflu if you have an allergy to oseltamivir or any to the ingredients in Tamiflu.  Based upon your symptoms and potential risk factors I have prescribed Oseltamivir (Tamiflu).  It has been sent to your designated pharmacy.  You will take one 75 mg capsule orally twice a day for the next 5 days.  ANYONE WHO HAS FLU SYMPTOMS SHOULD: Stay home. The flu is highly contagious and going out or to work exposes others! Be sure to drink plenty of fluids. Water is fine as well as fruit juices, sodas and electrolyte beverages. You may want to stay away from caffeine or alcohol. If you are nauseated, try taking small sips of liquids. How do you know if you are getting enough fluid? Your urine should be a pale yellow or almost colorless. Get rest. Taking a steamy  shower or using a humidifier may help nasal congestion and ease sore throat pain. Using a saline nasal spray works much the same way. Cough drops, hard candies and sore throat lozenges may ease your cough. Line up a caregiver. Have someone check on you regularly.   GET HELP RIGHT AWAY IF: You cannot keep down liquids or your medications. You become short of breath Your fell like you are going to pass out or loose consciousness. Your symptoms persist after you have completed your treatment plan MAKE SURE YOU  Understand these instructions. Will watch your condition. Will get help right away if you are not doing well or get worse.  Your e-visit answers were reviewed by a board certified advanced clinical practitioner to complete your personal care plan.  Depending on the condition, your plan could have included both over the counter or prescription medications.  If there is a problem please reply  once you have received a response from your provider.  Your safety is important to us.  If you have drug allergies check your prescription carefully.    You can use MyChart to ask questions about today's visit, request a non-urgent call back, or ask for a work or school excuse for 24 hours related to this e-Visit. If it has been greater than 24 hours you will need to follow up with your provider, or enter a new e-Visit to address those concerns.  You will get an e-mail in the next   two days asking about your experience.  I hope that your e-visit has been valuable and will speed your recovery. Thank you for using e-visits.  I have spent 5 minutes in review of e-visit questionnaire, review and updating patient chart, medical decision making and response to patient.   Meryl Hubers M Amadi Frady, PA-C  

## 2023-09-22 ENCOUNTER — Telehealth: Payer: BC Managed Care – PPO | Admitting: Family Medicine

## 2023-09-22 DIAGNOSIS — J40 Bronchitis, not specified as acute or chronic: Secondary | ICD-10-CM

## 2023-09-22 MED ORDER — PSEUDOEPH-BROMPHEN-DM 30-2-10 MG/5ML PO SYRP: 5.0000 mL | ORAL_SOLUTION | Freq: Four times a day (QID) | ORAL | 0 refills | Status: DC | PRN

## 2023-09-22 MED ORDER — PREDNISONE 10 MG (21) PO TBPK: ORAL_TABLET | ORAL | 0 refills | Status: DC

## 2023-09-22 MED ORDER — DOXYCYCLINE MONOHYDRATE 100 MG PO CAPS: 100.0000 mg | ORAL_CAPSULE | Freq: Two times a day (BID) | ORAL | 0 refills | Status: AC

## 2023-09-22 NOTE — Addendum Note (Signed)
Addended by: Margaretann Loveless on: 09/22/2023 07:37 AM   Modules accepted: Orders

## 2023-09-22 NOTE — Patient Instructions (Signed)
 [name], thank you for joining Roosvelt Mater, PA-C for today's virtual visit.  While this provider is not your primary care provider (PCP), if your PCP is located in our provider database this encounter information will be shared with them immediately following your visit.   A [facility] MyChart account gives you access to today's visit and all your visits, tests, and labs performed at [facility]  click here if you don't have a [place] [facility] MyChart account or go to [email or web address]  Consent: (Patient) [name] provided verbal consent for this virtual visit at the beginning of the encounter.  Current Medications:  Current Outpatient Medications:    doxycycline  (MONODOX ) 100 MG capsule, Take 1 capsule (100 mg total) by mouth 2 (two) times daily for 7 days., Disp: 14 capsule, Rfl: 0   predniSONE  (STERAPRED UNI-PAK 21 TAB) 10 MG (21) TBPK tablet, Take following package directions., Disp: 21 tablet, Rfl: 0   benzonatate  (TESSALON ) 100 MG capsule, Take 2 capsules (200 mg total) by mouth every 8 (eight) hours., Disp: 21 capsule, Rfl: 0   brompheniramine-pseudoephedrine -DM 30-2-10 MG/5ML syrup, Take 5 mLs by mouth 4 (four) times daily as needed., Disp: 120 mL, Rfl: 0   fluticasone  (FLONASE ) 50 MCG/ACT nasal spray, Place 2 sprays into both nostrils daily., Disp: 16 g, Rfl: 0   gabapentin (NEURONTIN) 300 MG capsule, Take by mouth., Disp: , Rfl:    ipratropium (ATROVENT ) 0.06 % nasal spray, Place 2 sprays into both nostrils 4 (four) times daily., Disp: 15 mL, Rfl: 12   naproxen  (NAPROSYN ) 500 MG tablet, Take 1 tablet (500 mg total) by mouth 2 (two) times daily with a meal., Disp: 30 tablet, Rfl: 0   oseltamivir  (TAMIFLU ) 75 MG capsule, Take 1 capsule (75 mg total) by mouth 2 (two) times daily., Disp: 10 capsule, Rfl: 0   promethazine -dextromethorphan  (PROMETHAZINE -DM) 6.25-15 MG/5ML syrup, Take 5 mLs by mouth 4 (four) times daily as needed., Disp: 118 mL, Rfl: 0    Medications ordered in this encounter:  Meds ordered this encounter  Medications   doxycycline  (MONODOX ) 100 MG capsule    Sig: Take 1 capsule (100 mg total) by mouth 2 (two) times daily for 7 days.    Dispense:  14 capsule    Refill:  0   predniSONE  (STERAPRED UNI-PAK 21 TAB) 10 MG (21) TBPK tablet    Sig: Take following package directions.    Dispense:  21 tablet    Refill:  0    Please dispense one standard blister pack taper.     *If you need refills on other medications prior to your next appointment, please contact your pharmacy*  Follow-Up: Call back or seek an in-person evaluation if the symptoms worsen or if the condition fails to improve as anticipated.  [facility] [phone]  Other Instructions Acute Bronchitis, Adult  Acute bronchitis is sudden inflammation of the main airways (bronchi) that come off the windpipe (trachea) in the lungs. The swelling causes the airways to get smaller and make more mucus than normal. This can make it hard to breathe and can cause coughing or noisy breathing (wheezing). Acute bronchitis may last several weeks. The cough may last longer. Allergies, asthma, and exposure to smoke may make the condition worse. What are the causes? This condition can be caused by germs and by substances that irritate the lungs, including: Cold and flu viruses. The most common cause of this condition is the virus that causes the common cold. Bacteria.  This is less common. Breathing in substances that irritate the lungs, including: Smoke from cigarettes and other forms of tobacco. Dust and pollen. Fumes from household cleaning products, gases, or burned fuel. Indoor or outdoor air pollution. What increases the risk? The following factors may make you more likely to develop this condition: A weak body's defense system, also called the immune system. A condition that affects your lungs and breathing, such as asthma. What are the signs or  symptoms? Common symptoms of this condition include: Coughing. This may bring up clear, yellow, or green mucus from your lungs (sputum). Wheezing. Runny or stuffy nose. Having too much mucus in your lungs (chest congestion). Shortness of breath. Aches and pains, including sore throat or chest. How is this diagnosed? This condition is usually diagnosed based on: Your symptoms and medical history. A physical exam. You may also have other tests, including tests to rule out other conditions, such as pneumonia. These tests include: A test of lung function. Test of a mucus sample to look for the presence of bacteria. Tests to check the oxygen level in your blood. Blood tests. Chest X-ray. How is this treated? Most cases of acute bronchitis clear up over time without treatment. Your health care provider may recommend: Drinking more fluids to help thin your mucus so it is easier to cough up. Taking inhaled medicine (inhaler) to improve air flow in and out of your lungs. Using a vaporizer or a humidifier. These are machines that add water to the air to help you breathe better. Taking a medicine that thins mucus and clears congestion (expectorant). Taking a medicine that prevents or stops coughing (cough suppressant). It is not common to take an antibiotic medicine for this condition. Follow these instructions at home:  Take over-the-counter and prescription medicines only as told by your health care provider. Use an inhaler, vaporizer, or humidifier as told by your health care provider. Take two teaspoons (10 mL) of honey at bedtime to lessen coughing at night. Drink enough fluid to keep your urine pale yellow. Do not use any products that contain nicotine  or tobacco. These products include cigarettes, chewing tobacco, and vaping devices, such as e-cigarettes. If you need help quitting, ask your health care provider. Get plenty of rest. Return to your normal activities as told by your health  care provider. Ask your health care provider what activities are safe for you. Keep all follow-up visits. This is important. How is this prevented? To lower your risk of getting this condition again: Wash your hands often with soap and water for at least 20 seconds. If soap and water are not available, use hand sanitizer. Avoid contact with people who have cold symptoms. Try not to touch your mouth, nose, or eyes with your hands. Avoid breathing in smoke or chemical fumes. Breathing smoke or chemical fumes will make your condition worse. Get the flu shot every year. Contact a health care provider if: Your symptoms do not improve after 2 weeks. You have trouble coughing up the mucus. Your cough keeps you awake at night. You have a fever. Get help right away if you: Cough up blood. Feel pain in your chest. Have severe shortness of breath. Faint or keep feeling like you are going to faint. Have a severe headache. Have a fever or chills that get worse. These symptoms may represent a serious problem that is an emergency. Do not wait to see if the symptoms will go away. Get medical help right away. Call your local  emergency services (911 in the [place] Do not drive yourself to the hospital. Summary Acute bronchitis is inflammation of the main airways (bronchi) that come off the windpipe (trachea) in the lungs. The swelling causes the airways to get smaller and make more mucus than normal. Drinking more fluids can help thin your mucus so it is easier to cough up. Take over-the-counter and prescription medicines only as told by your health care provider. Do not use any products that contain nicotine  or tobacco. These products include cigarettes, chewing tobacco, and vaping devices, such as e-cigarettes. If you need help quitting, ask your health care provider. Contact a health care provider if your symptoms do not improve after 2 weeks. This information is not intended to replace advice given to  you by your health care provider. Make sure you discuss any questions you have with your health care provider. Document Revised: 12/19/2021 Document Reviewed: 01/09/2021 Elsevier Patient Education  2024 Elsevier Inc.    If you have been instructed to have an in-person evaluation today at a local Urgent Care facility, please use the link below. It will take you to a list of all of our available [facility], including address, phone number and hours of operation. Please do not delay care.  [facility]  If you or a family member do not have a primary care provider, use the link below to schedule a visit and establish care. When you choose a [facility] primary care physician or advanced practice provider, you gain a long-term partner in health. Find a Primary Care Provider  Learn more about [facility]'s in-office and virtual care options:  - Get Care Now

## 2023-09-22 NOTE — Progress Notes (Signed)
 Virtual Visit Consent   [name], you are scheduled for a virtual visit with a [facility] provider today. Just as with appointments in the office, your consent must be obtained to participate. Your consent will be active for this visit and any virtual visit you may have with one of our providers in the next 365 days. If you have a MyChart account, a copy of this consent can be sent to you electronically.  As this is a virtual visit, video technology does not allow for your provider to perform a traditional examination. This may limit your provider's ability to fully assess your condition. If your provider identifies any concerns that need to be evaluated in person or the need to arrange testing (such as labs, EKG, etc.), we will make arrangements to do so. Although advances in technology are sophisticated, we cannot ensure that it will always work on either your end or our end. If the connection with a video visit is poor, the visit may have to be switched to a telephone visit. With either a video or telephone visit, we are not always able to ensure that we have a secure connection.  By engaging in this virtual visit, you consent to the provision of healthcare and authorize for your insurance to be billed (if applicable) for the services provided during this visit. Depending on your insurance coverage, you may receive a charge related to this service.  I need to obtain your verbal consent now. Are you willing to proceed with your visit today? [name] has provided verbal consent on 09/22/2023 for a virtual visit (video or telephone). [name], [place]  Date: 09/22/2023 7:40 PM  Virtual Visit via Video Note   I, [name], connected with  [name]  ([id number], 06/05/1976) on 09/22/23 at  7:30 PM EST by a video-enabled telemedicine application and verified that I am speaking with the correct person using two identifiers.  Location: Patient: Virtual Visit Location  Patient: Home Provider: Virtual Visit Location Provider: Home Office   I discussed the limitations of evaluation and management by telemedicine and the availability of in person appointments. The patient expressed understanding and agreed to proceed.    History of Present Illness: [name] is a 47 y.o. who identifies as a female who was assigned female at birth, and is being seen today for c/o having the flu but states she is having a really bad cough and it hurts her whole body.  Pt states it hurts to move and is very uncomfortable.  Pt states the cough has worsen in the last couple of days. Pt states she feels like its a dry cough and nothing is coming up. Pt denies wheezing. Pt states taking prescribed cough medicine but is not helping.  HPI: HPI  Problems:  Patient Active Problem List   Diagnosis Date Noted   Thrombocytosis 05/15/2022   Erythrocytosis 05/15/2022   Status post surgery 08/29/2019   GI bleed 03/23/2018   Leucocytosis 03/04/2016    Allergies: No Known Allergies Medications:  Current Outpatient Medications:    doxycycline  (MONODOX ) 100 MG capsule, Take 1 capsule (100 mg total) by mouth 2 (two) times daily for 7 days., Disp: 14 capsule, Rfl: 0   predniSONE  (STERAPRED UNI-PAK 21 TAB) 10 MG (21) TBPK tablet, Take following package directions., Disp: 21 tablet, Rfl: 0   benzonatate  (TESSALON ) 100 MG capsule, Take 2 capsules (200 mg total) by mouth every 8 (eight) hours., Disp: 21 capsule, Rfl: 0  brompheniramine-pseudoephedrine -DM 30-2-10 MG/5ML syrup, Take 5 mLs by mouth 4 (four) times daily as needed., Disp: 120 mL, Rfl: 0   fluticasone  (FLONASE ) 50 MCG/ACT nasal spray, Place 2 sprays into both nostrils daily., Disp: 16 g, Rfl: 0   gabapentin (NEURONTIN) 300 MG capsule, Take by mouth., Disp: , Rfl:    ipratropium (ATROVENT ) 0.06 % nasal spray, Place 2 sprays into both nostrils 4 (four) times daily., Disp: 15 mL, Rfl: 12   naproxen  (NAPROSYN ) 500 MG tablet, Take 1  tablet (500 mg total) by mouth 2 (two) times daily with a meal., Disp: 30 tablet, Rfl: 0   oseltamivir  (TAMIFLU ) 75 MG capsule, Take 1 capsule (75 mg total) by mouth 2 (two) times daily., Disp: 10 capsule, Rfl: 0   promethazine -dextromethorphan  (PROMETHAZINE -DM) 6.25-15 MG/5ML syrup, Take 5 mLs by mouth 4 (four) times daily as needed., Disp: 118 mL, Rfl: 0  Observations/Objective: Patient is well-developed, well-nourished in no acute distress.  Resting comfortably at home.  Head is normocephalic, atraumatic.  No labored breathing.  Speech is clear and coherent with logical content.  Patient is alert and oriented at baseline.    Assessment and Plan: 1. Bronchitis (Primary) - doxycycline  (MONODOX ) 100 MG capsule; Take 1 capsule (100 mg total) by mouth 2 (two) times daily for 7 days.  Dispense: 14 capsule; Refill: 0 - predniSONE  (STERAPRED UNI-PAK 21 TAB) 10 MG (21) TBPK tablet; Take following package directions.  Dispense: 21 tablet; Refill: 0   -Will treat patient for bronchitis vs pneumonia, given holiday and time of night, urgent cares may be over populated therefore will treat with that in mind as well  -Advised Pt to follow up in person if symptoms persist or worsen.   Follow Up Instructions: I discussed the assessment and treatment plan with the patient. The patient was provided an opportunity to ask questions and all were answered. The patient agreed with the plan and demonstrated an understanding of the instructions.  A copy of instructions were sent to the patient via MyChart unless otherwise noted below.    The patient was advised to call back or seek an in-person evaluation if the symptoms worsen or if the condition fails to improve as anticipated.    Jasmine Mater, PA-C

## 2023-10-17 DIAGNOSIS — R42 Dizziness and giddiness: Secondary | ICD-10-CM | POA: Diagnosis not present

## 2023-10-17 DIAGNOSIS — Z0000 Encounter for general adult medical examination without abnormal findings: Secondary | ICD-10-CM | POA: Diagnosis not present

## 2023-10-22 DIAGNOSIS — R1030 Lower abdominal pain, unspecified: Secondary | ICD-10-CM | POA: Diagnosis not present

## 2023-10-22 DIAGNOSIS — Z1389 Encounter for screening for other disorder: Secondary | ICD-10-CM | POA: Diagnosis not present

## 2023-10-22 DIAGNOSIS — Z0131 Encounter for examination of blood pressure with abnormal findings: Secondary | ICD-10-CM | POA: Diagnosis not present

## 2023-10-22 DIAGNOSIS — E781 Pure hyperglyceridemia: Secondary | ICD-10-CM | POA: Diagnosis not present

## 2023-10-22 DIAGNOSIS — M25551 Pain in right hip: Secondary | ICD-10-CM | POA: Diagnosis not present

## 2023-10-29 ENCOUNTER — Encounter: Payer: Self-pay | Admitting: Obstetrics and Gynecology

## 2023-10-29 DIAGNOSIS — F418 Other specified anxiety disorders: Secondary | ICD-10-CM | POA: Diagnosis not present

## 2023-11-04 ENCOUNTER — Telehealth: Payer: BC Managed Care – PPO | Admitting: Family

## 2023-11-04 DIAGNOSIS — K0889 Other specified disorders of teeth and supporting structures: Secondary | ICD-10-CM | POA: Diagnosis not present

## 2023-11-04 MED ORDER — IBUPROFEN 600 MG PO TABS: 600.0000 mg | ORAL_TABLET | Freq: Three times a day (TID) | ORAL | 0 refills | Status: DC | PRN

## 2023-11-04 NOTE — Progress Notes (Signed)
E-Visit for Dental Pain  We are sorry that you are not feeling well.  Here is how we plan to help!  Based on what you have shared with me in the questionnaire, it sounds like you have dental pain.   Ibuprofen 600mg  3 times a day for 7 days for discomfort  It is imperative that you see a dentist within 10 days of this eVisit to determine the cause of the dental pain and be sure it is adequately treated. I have sent a work note to Pharmacologist.   A toothache or tooth pain is caused when the nerve in the root of a tooth or surrounding a tooth is irritated. Dental (tooth) infection, decay, injury, or loss of a tooth are the most common causes of dental pain. Pain may also occur after an extraction (tooth is pulled out). Pain sometimes originates from other areas and radiates to the jaw, thus appearing to be tooth pain.Bacteria growing inside your mouth can contribute to gum disease and dental decay, both of which can cause pain. A toothache occurs from inflammation of the central portion of the tooth called pulp. The pulp contains nerve endings that are very sensitive to pain. Inflammation to the pulp or pulpitis may be caused by dental cavities, trauma, and infection.    HOME CARE:   For toothaches: Over-the-counter pain medications such as acetaminophen or ibuprofen may be used. Take these as directed on the package while you arrange for a dental appointment. Avoid very cold or hot foods, because they may make the pain worse. You may get relief from biting on a cotton ball soaked in oil of cloves. You can get oil of cloves at most drug stores.  For jaw pain:  Aspirin may be helpful for problems in the joint of the jaw in adults. If pain happens every time you open your mouth widely, the temporomandibular joint (TMJ) may be the source of the pain. Yawning or taking a large bite of food may worsen the pain. An appointment with your doctor or dentist will help you find the cause.     GET HELP  RIGHT AWAY IF:  You have a high fever or chills If you have had a recent head or face injury and develop headache, light headedness, nausea, vomiting, or other symptoms that concern you after an injury to your face or mouth, you could have a more serious injury in addition to your dental injury. A facial rash associated with a toothache: This condition may improve with medication. Contact your doctor for them to decide what is appropriate. Any jaw pain occurring with chest pain: Although jaw pain is most commonly caused by dental disease, it is sometimes referred pain from other areas. People with heart disease, especially people who have had stents placed, people with diabetes, or those who have had heart surgery may have jaw pain as a symptom of heart attack or angina. If your jaw or tooth pain is associated with lightheadedness, sweating, or shortness of breath, you should see a doctor as soon as possible. Trouble swallowing or excessive pain or bleeding from gums: If you have a history of a weakened immune system, diabetes, or steroid use, you may be more susceptible to infections. Infections can often be more severe and extensive or caused by unusual organisms. Dental and gum infections in people with these conditions may require more aggressive treatment. An abscess may need draining or IV antibiotics, for example.  MAKE SURE YOU   Understand these  instructions. Will watch your condition. Will get help right away if you are not doing well or get worse.  Thank you for choosing an e-visit.  Your e-visit answers were reviewed by a board certified advanced clinical practitioner to complete your personal care plan. Depending upon the condition, your plan could have included both over the counter or prescription medications.  Please review your pharmacy choice. Make sure the pharmacy is open so you can pick up prescription now. If there is a problem, you may contact your provider through [employer] and have the prescription routed to another pharmacy.  Your safety is important to [place]. If you have drug allergies check your prescription carefully.   For the next 24 hours you can use MyChart to ask questions about today's visit, request a non-urgent call back, or ask for a work or school excuse. You will get an email in the next two days asking about your experience. I hope that your e-visit has been valuable and will speed your recovery. Approximately 5 minutes was spent documenting and reviewing patient's chart.

## 2023-11-05 ENCOUNTER — Ambulatory Visit: Payer: BC Managed Care – PPO | Admitting: Obstetrics and Gynecology

## 2023-11-05 DIAGNOSIS — Z01419 Encounter for gynecological examination (general) (routine) without abnormal findings: Secondary | ICD-10-CM

## 2023-11-05 DIAGNOSIS — Z1231 Encounter for screening mammogram for malignant neoplasm of breast: Secondary | ICD-10-CM

## 2023-11-12 ENCOUNTER — Encounter: Payer: Self-pay | Admitting: Obstetrics and Gynecology

## 2023-11-17 ENCOUNTER — Telehealth: Payer: BC Managed Care – PPO | Admitting: Physician Assistant

## 2023-11-17 DIAGNOSIS — S39012A Strain of muscle, fascia and tendon of lower back, initial encounter: Secondary | ICD-10-CM | POA: Diagnosis not present

## 2023-11-17 NOTE — Progress Notes (Signed)
 E-Visit for Back Pain   We are sorry that you are not feeling well.  Here is how we plan to help!  Based on what you have shared with me it looks like you mostly have acute back pain.  Acute back pain is defined as musculoskeletal pain that can resolve in 1-3 weeks with conservative treatment.   Back pain is very common.  The pain often gets better over time.  The cause of back pain is usually not dangerous.  Most people can learn to manage their back pain on their own. I will send back exercises in a separate message to follow.   A work note has been provided for you today. It will be available under "letters" in your MyChart account.  Home Care Stay active.  Start with short walks on flat ground if you can.  Try to walk farther each day. Do not sit, drive or stand in one place for more than 30 minutes.  Do not stay in bed. Do not avoid exercise or work.  Activity can help your back heal faster. Be careful when you bend or lift an object.  Bend at your knees, keep the object close to you, and do not twist. Sleep on a firm mattress.  Lie on your side, and bend your knees.  If you lie on your back, put a pillow under your knees. Only take medicines as told by your doctor. Put ice on the injured area. Put ice in a plastic bag Place a towel between your skin and the bag Leave the ice on for 15-20 minutes, 3-4 times a day for the first 2-3 days. 210 After that, you can switch between ice and heat packs. Ask your doctor about back exercises or massage. Avoid feeling anxious or stressed.  Find good ways to deal with stress, such as exercise.  Get Help Right Way If: Your pain does not go away with rest or medicine. Your pain does not go away in 1 week. You have new problems. You do not feel well. The pain spreads into your legs. You cannot control when you poop (bowel movement) or pee (urinate) You feel sick to your stomach (nauseous) or throw up (vomit) You have belly (abdominal)  pain. You feel like you may pass out (faint). If you develop a fever.  Make Sure you: Understand these instructions. Will watch your condition Will get help right away if you are not doing well or get worse.  Your e-visit answers were reviewed by a board certified advanced clinical practitioner to complete your personal care plan.  Depending on the condition, your plan could have included both over the counter or prescription medications.  If there is a problem please reply  once you have received a response from your provider.  Your safety is important to [place].  If you have drug allergies check your prescription carefully.    You can use MyChart to ask questions about today's visit, request a non-urgent call back, or ask for a work or school excuse for 24 hours related to this e-Visit. If it has been greater than 24 hours you will need to follow up with your provider, or enter a new e-Visit to address those concerns.  You will get an e-mail in the next two days asking about your experience.  I hope that your e-visit has been valuable and will speed your recovery. Thank you for using e-visits.   I have spent 5 minutes in review of e-visit questionnaire, review  and updating patient chart, medical decision making and response to patient.   Margaretann Loveless, PA-C

## 2023-12-02 ENCOUNTER — Telehealth: Admitting: Physician Assistant

## 2023-12-02 DIAGNOSIS — J019 Acute sinusitis, unspecified: Secondary | ICD-10-CM

## 2023-12-02 DIAGNOSIS — B9789 Other viral agents as the cause of diseases classified elsewhere: Secondary | ICD-10-CM

## 2023-12-02 NOTE — Progress Notes (Signed)
 E-Visit for Sinus Problems  We are sorry that you are not feeling well.  Here is how we plan to help!  Based on what you have shared with me it looks like you have sinusitis.  Sinusitis is inflammation and infection in the sinus cavities of the head.  Based on your presentation I believe you most likely have Acute Viral Sinusitis.This is an infection most likely caused by a virus. There is not specific treatment for viral sinusitis other than to help you with the symptoms until the infection runs its course.  You may use an oral decongestant such as Mucinex D or if you have glaucoma or high blood pressure use plain Mucinex. Saline nasal spray help and can safely be used as often as needed for congestion.  Some authorities believe that zinc sprays or the use of Echinacea may shorten the course of your symptoms.  Sinus infections are not as easily transmitted as other respiratory infection, however we still recommend that you avoid close contact with loved ones, especially the very young and elderly.  Remember to wash your hands thoroughly throughout the day as this is the number one way to prevent the spread of infection!  A work note has been provided for you today. It will be available under "letters" in your MyChart account.  Home Care: Only take medications as instructed by your medical team. Do not take these medications with alcohol. A steam or ultrasonic humidifier can help congestion.  You can place a towel over your head and breathe in the steam from hot water coming from a faucet. Avoid close contacts especially the very young and the elderly. Cover your mouth when you cough or sneeze. Always remember to wash your hands.  Get Help Right Away If: You develop worsening fever or sinus pain. You develop a severe head ache or visual changes. Your symptoms persist after you have completed your treatment plan.  Make sure you Understand these instructions. Will watch your condition. Will  get help right away if you are not doing well or get worse.   Thank you for choosing an e-visit.  Your e-visit answers were reviewed by a board certified advanced clinical practitioner to complete your personal care plan. Depending upon the condition, your plan could have included both over the counter or prescription medications.  Please review your pharmacy choice. Make sure the pharmacy is open so you can pick up prescription now. If there is a problem, you may contact your provider through [employer] and have the prescription routed to another pharmacy.  Your safety is important to [place]. If you have drug allergies check your prescription carefully.   For the next 24 hours you can use MyChart to ask questions about today's visit, request a non-urgent call back, or ask for a work or school excuse. You will get an email in the next two days asking about your experience. I hope that your e-visit has been valuable and will speed your recovery.    I have spent 5 minutes in review of e-visit questionnaire, review and updating patient chart, medical decision making and response to patient.   Margaretann Loveless, PA-C

## 2024-01-01 DIAGNOSIS — M545 Low back pain, unspecified: Secondary | ICD-10-CM | POA: Diagnosis not present

## 2024-01-01 DIAGNOSIS — I10 Essential (primary) hypertension: Secondary | ICD-10-CM | POA: Diagnosis not present

## 2024-01-01 DIAGNOSIS — E781 Pure hyperglyceridemia: Secondary | ICD-10-CM | POA: Diagnosis not present

## 2024-01-01 DIAGNOSIS — Z0130 Encounter for examination of blood pressure without abnormal findings: Secondary | ICD-10-CM | POA: Diagnosis not present

## 2024-01-01 DIAGNOSIS — Z1389 Encounter for screening for other disorder: Secondary | ICD-10-CM | POA: Diagnosis not present

## 2024-01-01 DIAGNOSIS — Z0131 Encounter for examination of blood pressure with abnormal findings: Secondary | ICD-10-CM | POA: Diagnosis not present

## 2024-01-18 ENCOUNTER — Ambulatory Visit
Admission: EM | Admit: 2024-01-18 | Discharge: 2024-01-18 | Disposition: A | Discharge status: Home / Self Care | Attending: Family Medicine | Admitting: Family Medicine

## 2024-01-18 DIAGNOSIS — B354 Tinea corporis: Secondary | ICD-10-CM | POA: Diagnosis not present

## 2024-01-18 MED ORDER — TERBINAFINE HCL 250 MG PO TABS: 250.0000 mg | ORAL_TABLET | Freq: Every day | ORAL | 0 refills | Status: AC

## 2024-01-18 NOTE — ED Triage Notes (Signed)
 Patient states that she noticed a rash on her left wrist this morning.

## 2024-01-18 NOTE — Discharge Instructions (Signed)
 Stop by the pharmacy to pick up your prescriptions.  Follow up with your primary care provider or return to the urgent care, if not improving. See handout on ringworm for more information.

## 2024-01-18 NOTE — ED Provider Notes (Signed)
 [facility]    CSN: [id number] Arrival date & time: 01/18/24  0901      History   Chief Complaint Chief Complaint  Patient presents with   Rash    HPI [name] is a 48 y.o. female.   HPI  [name] presents for rash on her left distal forearm/wrist that she noticed this morning after waking up.  She wears a smart watch with silicone band. Her grand-daughter has MRSA and was in the hospital last week. She has been around her granddaughter.   She has dogs and works with patients.    There is been no new products including soaps and detergents. She felt tired yesterday. There has been no medication changes or new supplements.  Denies any new foods or drinks.   [name] has otherwise been well and has no other concerns.    Past Medical History:  Diagnosis Date   Depression    Focal nodular hyperplasia determined by liver biopsy    Leukocytosis    Tobacco use     Patient Active Problem List   Diagnosis Date Noted   Thrombocytosis 05/15/2022   Erythrocytosis 05/15/2022   Status post surgery 08/29/2019   GI bleed 03/23/2018   Leucocytosis 03/04/2016    Past Surgical History:  Procedure Laterality Date   ABDOMINAL HYSTERECTOMY     BREAST BIOPSY Right 2016   us  core, fat necrosis   COLONOSCOPY WITH PROPOFOL  N/A 03/25/2018   Procedure: COLONOSCOPY WITH PROPOFOL ;  Surgeon: Toledo, Alphonsus Jeans, MD;  Location: [facility];  Service: Gastroenterology;  Laterality: N/A;   ESOPHAGOGASTRODUODENOSCOPY (EGD) WITH PROPOFOL  N/A 03/25/2018   Procedure: ESOPHAGOGASTRODUODENOSCOPY (EGD) WITH PROPOFOL ;  Surgeon: Toledo, Alphonsus Jeans, MD;  Location: [facility];  Service: Gastroenterology;  Laterality: N/A;   INTRAUTERINE DEVICE INSERTION     IUD REMOVAL  08/29/2019   Procedure: INTRAUTERINE DEVICE (IUD) REMOVAL;  Surgeon: Zenobia Hila, MD;  Location: [facility];  Service: Gynecology;;   KNEE CARTILAGE SURGERY Right 02/2015   LAPAROSCOPIC ASSISTED VAGINAL HYSTERECTOMY  N/A 08/29/2019   Procedure: LAPAROSCOPIC ASSISTED VAGINAL HYSTERECTOMY;  Surgeon: Zenobia Hila, MD;  Location: [facility];  Service: Gynecology;  Laterality: N/A;   LAPAROSCOPIC BILATERAL SALPINGO OOPHERECTOMY Bilateral 08/29/2019   Procedure: LAPAROSCOPIC BILATERAL SALPINGO OOPHORECTOMY;  Surgeon: Zenobia Hila, MD;  Location: [facility];  Service: Gynecology;  Laterality: Bilateral;   LAPAROSCOPIC TUBAL LIGATION      OB History     Gravida  4   Para  3   Term      Preterm      AB  1   Living         SAB  1   IAB      Ectopic      Multiple      Live Births           Obstetric Comments  1st Menstrual Cycle:  18 1st Pregnancy:  18           Home Medications    Prior to Admission medications   Medication Sig Start Date End Date Taking? Authorizing Provider  gabapentin (NEURONTIN) 300 MG capsule Take by mouth. 03/13/21  Yes [provider]  terbinafine (LAMISIL) 250 MG tablet Take 1 tablet (250 mg total) by mouth daily for 10 days. 01/18/24 01/28/24 Yes Timira Bieda, DO  ibuprofen  (ADVIL ) 600 MG tablet Take 1 tablet (600 mg total) by mouth every 8 (eight) hours as needed. 11/04/23   Yevette Hem, FNP  traZODone  (DESYREL ) 50 MG tablet Take 1 tablet (50 mg total) by mouth at bedtime. 06/26/20 08/23/20  Kent Pear, NP    Family History Family History  Problem Relation Age of Onset   Hypertension Mother    Prostate cancer Father    Hypertension Father    Breast cancer Maternal Grandmother 32   Colon cancer Neg Hx     Social History Social History   Tobacco Use   Smoking status: Every Day    Current packs/day: 1.00    Average packs/day: 1 pack/day for 10.0 years (10.0 ttl pk-yrs)    Types: Cigarettes   Smokeless tobacco: Never  Vaping Use   Vaping status: Never Used  Substance Use Topics   Alcohol use: No    Alcohol/week: 0.0 standard drinks of alcohol   Drug use: No     Allergies   Hydrocodone    Review of Systems Review  of Systems :negative unless otherwise stated in HPI.      Physical Exam Triage Vital Signs ED Triage Vitals  Encounter Vitals Group     BP 01/18/24 0932 (!) 142/96     Systolic BP Percentile --      Diastolic BP Percentile --      Pulse Rate 01/18/24 0932 93     Resp 01/18/24 0932 14     Temp 01/18/24 0932 98.9 F (37.2 C)     Temp Source 01/18/24 0932 Oral     SpO2 01/18/24 0932 96 %     Weight --      Height --      Head Circumference --      Peak Flow --      Pain Score 01/18/24 0931 3     Pain Loc --      Pain Education --      Exclude from Growth Chart --    No data found.  Updated Vital Signs BP (!) 142/96 (BP Location: Right Arm)   Pulse 93   Temp 98.9 F (37.2 C) (Oral)   Resp 14   LMP 08/22/2019   SpO2 96%   Visual Acuity Right Eye Distance:   Left Eye Distance:   Bilateral Distance:    Right Eye Near:   Left Eye Near:    Bilateral Near:     Physical Exam  GEN: alert, well appearing female, in no acute distress  EYES: no scleral injection or discharge CV: regular rate, brisk cap refill  RESP: no increased work of breathing MSK: no extremity edema, good ROM of bilateral forearms and wrists  NEURO: alert, moves all extremities appropriately SKIN: warm and dry; 2 circular erythematous lesions with raised borders and central clearing on left distal forearm/wrist area, similar smaller lesion on the right inner forearm     UC Treatments / Results  Labs (all labs ordered are listed, but only abnormal results are displayed) Labs Reviewed - No data to display  EKG   Radiology No results found.  Procedures Procedures (including critical care time)  Medications Ordered in UC Medications - No data to display  Initial Impression / Assessment and Plan / UC Course  I have reviewed the triage vital signs and the nursing notes.  Pertinent labs & imaging results that were available during my care of the patient were reviewed by me and considered in  my medical decision making (see chart for details).     Patient is a 48 y.o. femalewho presents for rash for the past day.  Overall, patient  is well-appearing and well-hydrated.  Vital signs stable.  [name] is afebrile.  Exam concerning for tinea corporis.  Treat with terbinafine daily for 10 days. CMP from June 2023 showed normal liver function.  No sign of bacterial infection to suggest antibiotics at this time.  Not likely viral exanthem. Doubt contact dermatitis.    Reviewed expectations regarding course of current medical issues.  All questions asked were answered.  Outlined signs and symptoms indicating need for more acute intervention. Patient verbalized understanding. After Visit Summary given.   Final Clinical Impressions(s) / UC Diagnoses   Final diagnoses:  Tinea corporis     Discharge Instructions      Stop by the pharmacy to pick up your prescriptions.  Follow up with your primary care provider or return to the urgent care, if not improving. See handout on ringworm for more information.       ED Prescriptions     Medication Sig Dispense Auth. Provider   terbinafine (LAMISIL) 250 MG tablet Take 1 tablet (250 mg total) by mouth daily for 10 days. 10 tablet Yolonda Purtle, DO      PDMP not reviewed this encounter.              Mahnoor Mathisen, DO 01/18/24 1122

## 2024-02-09 ENCOUNTER — Telehealth: Admitting: Physician Assistant

## 2024-02-09 DIAGNOSIS — W57XXXA Bitten or stung by nonvenomous insect and other nonvenomous arthropods, initial encounter: Secondary | ICD-10-CM | POA: Diagnosis not present

## 2024-02-09 DIAGNOSIS — S30861A Insect bite (nonvenomous) of abdominal wall, initial encounter: Secondary | ICD-10-CM | POA: Diagnosis not present

## 2024-02-09 DIAGNOSIS — L03311 Cellulitis of abdominal wall: Secondary | ICD-10-CM | POA: Diagnosis not present

## 2024-02-09 MED ORDER — DOXYCYCLINE HYCLATE 100 MG PO TABS: 100.0000 mg | ORAL_TABLET | Freq: Two times a day (BID) | ORAL | 0 refills | Status: DC

## 2024-02-09 NOTE — Progress Notes (Signed)
 Message sent to patient requesting further input regarding current symptoms. Awaiting patient response.

## 2024-02-09 NOTE — Progress Notes (Signed)
 E-Visit for Tick Bite  Thank you for describing your tick bite, Here is how we plan to help! Based on the information that you shared with me it looks like you have A tick that bite that we will treat with a short course of doxycycline .  In most cases a tick bite is painless and does not itch.  Most tick bites in which the tick is quickly removed do not require prescriptions. Ticks can transmit several diseases if they are infected and remain attacked to your skin. Therefore the length that the tick was attached and any symptoms you have experienced after the bite are import to accurately develop your custom treatment plan. In most cases a single dose of doxycycline  may prevent the development of a more serious condition.  Based on your information I have Provided a home care guide for tick bites and  instructions on when to call for help. I have prescribed Doxycycline  100 mg twice daily for 7 days to take as directed.  Which ticks  are associated with illness?  The Wood Tick (dog tick) is the size of a watermelon seed and can sometimes transmit [facility] spotted fever and [place]  tick fever.   The Deer Tick (black-legged tick) is between the size of a poppy seed (pin head) and an apple seed, and can sometimes transmit Lyme disease.  A brown to black tick with a white splotch on its back is likely a female Amblyomma americanum ([place] tick). This tick has been associated with [place] Tick Associated illness ( STARI)  Lyme disease has become the most common tick-borne illness in the [place] . The risk of Lyme disease following a recognized deer tick bite is estimated to be 1%.  The majority of cases of Lyme disease start with a bull's eye rash at the site of the tick bite. The rash can occur days to weeks (typically 7-10 days) after a tick bite. Treatment with antibiotics is indicated if this rash appears. Flu-like symptoms may accompany the rash, including: fever, chills,  headaches, muscle aches, and fatigue. Removing ticks promptly may prevent tick borne disease.  What can be used to prevent Tick Bites?  Insect repellant with at leas 20% DEET. Wearing long pants with sock and shoes. Avoiding tall grass and heavily wooded areas. Checking your skin after being outdoors. Shower with a washcloth after outdoor exposures.  HOME CARE ADVICE FOR TICK BITE  Wood Tick Removal:  Use a pair of tweezers and grasp the wood tick close to the skin (on its head). Pull the wood tick straight upward without twisting or crushing it. Maintain a steady pressure until it releases its grip.   If tweezers aren't available, use fingers, a loop of thread around the jaws, or a needle between the jaws for traction.  Note: covering the tick with petroleum jelly, nail polish or rubbing alcohol doesn't work. Neither does touching the tick with a hot or cold object. Tiny Deer Tick Removal:   Needs to be scraped off with a knife blade or credit card edge. Place tick in a sealed container (e.g. glass jar, zip lock plastic bag), in case your doctor wants to see it. Tick's Head Removal:  If the wood tick's head breaks off in the skin, it must be removed. Clean the skin. Then use a sterile needle to uncover the head and lift it out or scrape it off.  If a very small piece of the head remains, the skin will eventually slough it off.  Antibiotic Ointment:  Wash the wound and your hands with soap and water after removal to prevent catching any tick disease.  Apply an over the counter antibiotic ointment (e.g. bacitracin) to the bite once. Expected Course: Tick bites normally don't itch or hurt. That's why they often go unnoticed. Call Your Doctor If:  You can't remove the tick or the tick's head Fever, a severe head ache, or rash occur in the next 2 weeks Bite begins to look infected Lyme's disease is common in your area You have not had a tetanus in the last 10 years Your current symptoms  become worse    MAKE SURE YOU  Understand these instructions. Will watch your condition. Will get help right away if you are not doing well or get worse.    Thank you for choosing an e-visit.  Your e-visit answers were reviewed by a board certified advanced clinical practitioner to complete your personal care plan. Depending upon the condition, your plan could have included both over the counter or prescription medications.  Please review your pharmacy choice. Make sure the pharmacy is open so you can pick up prescription now. If there is a problem, you may contact your provider through [employer] and have the prescription routed to another pharmacy.  Your safety is important to us . If you have drug allergies check your prescription carefully.   For the next 24 hours you can use MyChart to ask questions about today's visit, request a non-urgent call back, or ask for a work or school excuse. You will get an email in the next two days asking about your experience. I hope that your e-visit has been valuable and will speed your recovery.

## 2024-02-09 NOTE — Progress Notes (Signed)
 I have spent 5 minutes in review of e-visit questionnaire, review and updating patient chart, medical decision making and response to patient.   Piedad Climes, PA-C

## 2024-02-13 DIAGNOSIS — G5601 Carpal tunnel syndrome, right upper limb: Secondary | ICD-10-CM | POA: Insufficient documentation

## 2024-03-20 ENCOUNTER — Telehealth: Admitting: Family

## 2024-03-20 DIAGNOSIS — J069 Acute upper respiratory infection, unspecified: Secondary | ICD-10-CM

## 2024-03-20 MED ORDER — BENZONATATE 100 MG PO CAPS: 100.0000 mg | ORAL_CAPSULE | Freq: Three times a day (TID) | ORAL | 0 refills | Status: DC | PRN

## 2024-03-20 MED ORDER — FLUTICASONE PROPIONATE 50 MCG/ACT NA SUSP
2.0000 | Freq: Every day | NASAL | 6 refills | Status: DC
Start: 2024-03-20 — End: 2024-06-16

## 2024-03-20 NOTE — Progress Notes (Signed)

## 2024-03-21 MED ORDER — PREDNISONE 20 MG PO TABS: 40.0000 mg | ORAL_TABLET | Freq: Every day | ORAL | 0 refills | Status: DC

## 2024-03-21 NOTE — Addendum Note (Signed)
 Addended by: VIVIENNE DELON HERO on: 03/21/2024 09:55 AM   Modules accepted: Orders

## 2024-03-22 ENCOUNTER — Ambulatory Visit (INDEPENDENT_AMBULATORY_CARE_PROVIDER_SITE_OTHER)

## 2024-03-22 ENCOUNTER — Ambulatory Visit
Admission: EM | Admit: 2024-03-22 | Discharge: 2024-03-22 | Disposition: A | Discharge status: Home / Self Care | Attending: Physician Assistant | Admitting: Physician Assistant

## 2024-03-22 DIAGNOSIS — R051 Acute cough: Secondary | ICD-10-CM | POA: Diagnosis not present

## 2024-03-22 DIAGNOSIS — J208 Acute bronchitis due to other specified organisms: Secondary | ICD-10-CM | POA: Diagnosis not present

## 2024-03-22 DIAGNOSIS — Z720 Tobacco use: Secondary | ICD-10-CM | POA: Diagnosis not present

## 2024-03-22 DIAGNOSIS — J984 Other disorders of lung: Secondary | ICD-10-CM | POA: Insufficient documentation

## 2024-03-22 DIAGNOSIS — R059 Cough, unspecified: Secondary | ICD-10-CM | POA: Diagnosis not present

## 2024-03-22 LAB — SARS CORONAVIRUS 2 BY RT PCR: SARS Coronavirus 2 by RT PCR: NEGATIVE

## 2024-03-22 MED ORDER — ALBUTEROL SULFATE (2.5 MG/3ML) 0.083% IN NEBU
2.5000 mg | INHALATION_SOLUTION | Freq: Once | RESPIRATORY_TRACT | Status: AC
  Administered 2024-03-22: 2.5 mg via RESPIRATORY_TRACT

## 2024-03-22 MED ORDER — PROMETHAZINE-DM 6.25-15 MG/5ML PO SYRP: 5.0000 mL | ORAL_SOLUTION | Freq: Four times a day (QID) | ORAL | 0 refills | Status: DC | PRN

## 2024-03-22 MED ORDER — ALBUTEROL SULFATE HFA 108 (90 BASE) MCG/ACT IN AERS: 1.0000 | INHALATION_SPRAY | Freq: Four times a day (QID) | RESPIRATORY_TRACT | 0 refills | Status: DC | PRN

## 2024-03-22 NOTE — Discharge Instructions (Addendum)
-   Chest was negative. - Your x-ray shows no pneumonia or signs of bacterial infection.  It does show chronic interstitial coarsening so you appear to have underlying chronic lung disease.  I advise you to follow-up with your PCP regarding this.  You may need a referral to pulmonology to have pulmonary function testing to further evaluate and treat this. - Cough related to bronchitis can last for up to 6 weeks.  Continue with the prednisone .  I sent cough medicine and an inhaler for you. - Return if not improving after a couple of weeks, fever or shortness of breath.

## 2024-03-22 NOTE — ED Provider Notes (Signed)
 [facility]    CSN: [id number] Arrival date & time: 03/22/24  0805      History   Chief Complaint Chief Complaint  Patient presents with   Nasal Congestion    HPI [name] is a 48 y.o. female presenting for 4 day history of cough and nasal congestion. Also reports fatigue.  No report of ear pain, sinus pain, chest pain or shortness of breath.  Reports cough causes her back to hurt and also leads to a bit of incontinence at times.  At home COVID test has been negative.  Patient had a telemedicine encounter 2 days ago and provider sent benzonatate , Flonase  and prednisone .  Patient has only had 1 dose of the prednisone .  She says she does not really feel much better over the past couple days.  Patient is a smoker but no history of COPD  or asthma in history.  Patient denies known history of chronic lung disease or restrictive lung disease.  HPI  Past Medical History:  Diagnosis Date   Depression    Focal nodular hyperplasia determined by liver biopsy    Leukocytosis    Tobacco use     Patient Active Problem List   Diagnosis Date Noted   Carpal tunnel syndrome of right wrist 02/13/2024   Low back pain 01/01/2024   Abdominal pain, lower 10/22/2023   Localized edema 07/23/2023   Hip pain, right 06/25/2023   Dental caries 06/05/2023   Hypertriglyceridemia 03/10/2023   Essential (primary) hypertension 09/23/2022   Thrombocytosis 05/15/2022   Erythrocytosis 05/15/2022   Steatosis of liver 03/06/2022   Increased frequency of urination 03/04/2022   Other specified anxiety disorders 07/24/2021   Postsurgical menopause 12/27/2020   Status post surgery 08/29/2019   Attention deficit hyperactivity disorder (ADHD), combined type 03/15/2019   Adrenal mass, left (HCC) 06/01/2018   GI bleed 03/23/2018   Knee pain, right 02/24/2018   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 01/01/2018   Hx of abnormal mammogram 01/01/2018   Tenosynovitis of  wrist 07/17/2016   Depressive disorder 10/17/2015   Tobacco use disorder 03/31/2014   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   Routine history and physical examination of adult 01/08/2010   Leukocytosis 10/03/2008   Allergic rhinitis 10/03/2008    Past Surgical History:  Procedure Laterality Date   ABDOMINAL HYSTERECTOMY     BREAST BIOPSY Right 2016   us  core, fat necrosis   COLONOSCOPY WITH PROPOFOL  N/A 03/25/2018   Procedure: COLONOSCOPY WITH PROPOFOL ;  Surgeon: Toledo, Ladell POUR, MD;  Location: [facility];  Service: Gastroenterology;  Laterality: N/A;   ESOPHAGOGASTRODUODENOSCOPY (EGD) WITH PROPOFOL  N/A 03/25/2018   Procedure: ESOPHAGOGASTRODUODENOSCOPY (EGD) WITH PROPOFOL ;  Surgeon: Toledo, Ladell POUR, MD;  Location: [facility];  Service: Gastroenterology;  Laterality: N/A;   INTRAUTERINE DEVICE INSERTION     IUD REMOVAL  08/29/2019   Procedure: INTRAUTERINE DEVICE (IUD) REMOVAL;  Surgeon: Janit Alm Agent, MD;  Location: [facility];  Service: Gynecology;;   KNEE CARTILAGE SURGERY Right 02/2015   LAPAROSCOPIC ASSISTED VAGINAL HYSTERECTOMY N/A 08/29/2019   Procedure: LAPAROSCOPIC ASSISTED VAGINAL HYSTERECTOMY;  Surgeon: Janit Alm Agent, MD;  Location: [facility];  Service: Gynecology;  Laterality: N/A;   LAPAROSCOPIC BILATERAL SALPINGO OOPHERECTOMY Bilateral 08/29/2019   Procedure: LAPAROSCOPIC BILATERAL SALPINGO OOPHORECTOMY;  Surgeon: Janit Alm Agent, MD;  Location: [facility];  Service: Gynecology;  Laterality: Bilateral;   LAPAROSCOPIC TUBAL LIGATION      OB History     Gravida  4  Para  3   Term      Preterm      AB  1   Living         SAB  1   IAB      Ectopic      Multiple      Live Births           Obstetric Comments  1st Menstrual Cycle:  18 1st Pregnancy:  18           Home Medications    Prior to Admission medications   Medication Sig Start Date End Date Taking? Authorizing Provider  albuterol  (VENTOLIN  HFA) 108  (90 Base) MCG/ACT inhaler Inhale 1-2 puffs into the lungs every 6 (six) hours as needed for wheezing or shortness of breath. 03/22/24  Yes Arvis Jolan NOVAK, PA-C  promethazine -dextromethorphan  (PROMETHAZINE -DM) 6.25-15 MG/5ML syrup Take 5 mLs by mouth 4 (four) times daily as needed. 03/22/24  Yes Arvis Jolan B, PA-C  benzonatate  (TESSALON  PERLES) 100 MG capsule Take 1 capsule (100 mg total) by mouth 3 (three) times daily as needed. 03/20/24   Lavell Bari LABOR, FNP  doxycycline  (VIBRA -TABS) 100 MG tablet Take 1 tablet (100 mg total) by mouth 2 (two) times daily. 02/09/24   Gladis Elsie BROCKS, PA-C  fluticasone  (FLONASE ) 50 MCG/ACT nasal spray Place 2 sprays into both nostrils daily. 03/20/24   Lavell Bari LABOR, FNP  gabapentin (NEURONTIN) 300 MG capsule Take by mouth. 03/13/21   [provider]  ibuprofen  (ADVIL ) 600 MG tablet Take 1 tablet (600 mg total) by mouth every 8 (eight) hours as needed. 11/04/23   Lavell Bari LABOR, FNP  predniSONE  (DELTASONE ) 20 MG tablet Take 2 tablets (40 mg total) by mouth daily with breakfast. 03/21/24   Vivienne Delon HERO, PA-C  traZODone  (DESYREL ) 50 MG tablet Take 1 tablet (50 mg total) by mouth at bedtime. 06/26/20 08/23/20  Bernardino Ditch, NP    Family History Family History  Problem Relation Age of Onset   Hypertension Mother    Prostate cancer Father    Hypertension Father    Breast cancer Maternal Grandmother 24   Colon cancer Neg Hx     Social History Social History   Tobacco Use   Smoking status: Every Day    Current packs/day: 1.00    Average packs/day: 1 pack/day for 10.0 years (10.0 ttl pk-yrs)    Types: Cigarettes   Smokeless tobacco: Never  Vaping Use   Vaping status: Never Used  Substance Use Topics   Alcohol use: No    Alcohol/week: 0.0 standard drinks of alcohol   Drug use: No     Allergies   Hydrocodone    Review of Systems Review of Systems  Constitutional:  Positive for fatigue. Negative for chills, diaphoresis and fever.   HENT:  Positive for congestion, rhinorrhea and sore throat. Negative for ear pain, sinus pressure and sinus pain.   Respiratory:  Positive for cough. Negative for shortness of breath.   Cardiovascular:  Negative for chest pain.  Gastrointestinal:  Negative for abdominal pain, nausea and vomiting.  Musculoskeletal:  Positive for back pain. Negative for myalgias.  Skin:  Negative for rash.  Neurological:  Negative for weakness and headaches.  Hematological:  Negative for adenopathy.     Physical Exam Triage Vital Signs ED Triage Vitals  Encounter Vitals Group     BP      Girls Systolic BP Percentile      Girls Diastolic BP Percentile  Boys Systolic BP Percentile      Boys Diastolic BP Percentile      Pulse      Resp      Temp      Temp src      SpO2      Weight      Height      Head Circumference      Peak Flow      Pain Score      Pain Loc      Pain Education      Exclude from Growth Chart    No data found.  Updated Vital Signs BP (!) 146/91 (BP Location: Right Arm)   Pulse (!) 110   Temp 99.1 F (37.3 C) (Oral)   Resp 16   Ht 5' 2 (1.575 m)   Wt 150 lb (68 kg)   LMP 08/22/2019   SpO2 96%   BMI 27.44 kg/m      Physical Exam Vitals and nursing note reviewed.  Constitutional:      General: She is not in acute distress.    Appearance: Normal appearance. She is not ill-appearing or toxic-appearing.  HENT:     Head: Normocephalic and atraumatic.     Nose: Congestion present.     Mouth/Throat:     Mouth: Mucous membranes are moist.     Pharynx: Oropharynx is clear.   Eyes:     General: No scleral icterus.       Right eye: No discharge.        Left eye: No discharge.     Conjunctiva/sclera: Conjunctivae normal.    Cardiovascular:     Rate and Rhythm: Regular rhythm. Tachycardia present.     Heart sounds: Normal heart sounds.  Pulmonary:     Effort: Pulmonary effort is normal. No respiratory distress.     Breath sounds: Wheezing (LLL. Clears  with cough) present.   Musculoskeletal:     Cervical back: Neck supple.   Skin:    General: Skin is dry.   Neurological:     General: No focal deficit present.     Mental Status: She is alert. Mental status is at baseline.     Motor: No weakness.     Gait: Gait normal.   Psychiatric:        Mood and Affect: Mood normal.        Behavior: Behavior normal.      UC Treatments / Results  Labs (all labs ordered are listed, but only abnormal results are displayed) Labs Reviewed  SARS CORONAVIRUS 2 BY RT PCR    EKG   Radiology DG Chest 2 View Result Date: 03/22/2024 CLINICAL DATA:  Coughing congestion. EXAM: CHEST - 2 VIEW COMPARISON:  06/24/2022 FINDINGS: The lungs are clear without focal pneumonia, edema, pneumothorax or pleural effusion. Interstitial markings are diffusely coarsened with chronic features. The cardiopericardial silhouette is within normal limits for size. No acute bony abnormality. IMPRESSION: Chronic interstitial coarsening without acute cardiopulmonary findings. Electronically Signed   By: Camellia Candle M.D.   On: 03/22/2024 08:46    Procedures Procedures (including critical care time)  Medications Ordered in UC Medications  albuterol  (PROVENTIL ) (2.5 MG/3ML) 0.083% nebulizer solution 2.5 mg (2.5 mg Nebulization Given 03/22/24 0847)    Initial Impression / Assessment and Plan / UC Course  I have reviewed the triage vital signs and the nursing notes.  Pertinent labs & imaging results that were available during my care of the patient were reviewed  by me and considered in my medical decision making (see chart for details).   48 year old female presenting for cough, congestion, fatigue, and miild sore throat x 4 days.  Denies fever, breathing difficultly.  Has been around a couple sick contacts.  Has COVID test at home and it was negative.   Patient is afebrile.  She is mildly ill-appearing but nontoxic.  On exam she has nasal congestion and postnasal  drainage.  Throat is clear.  Wheezes LLL which clear with cough.   COVID test obtained. Negative.   Patient given albuterol  neb.   CXR obtained--shows chronic lung changes (interstitial coarsening).  Reviewed results of x-ray with patient.  Patient denies known history of interstitial lung disease.  Printed copy of x-ray results and encouraged her to follow-up with PCP.  Encouraged her to request referral to pulmonology for pulmonary function testing and further diagnosis and treatment of this condition.  Explained if she does not this chronic condition could worsen.  At this time symptoms consistent with acute viral bronchitis.  Supportive care encouraged.  Continue prednisone , benzonatate  and Flonase .  Sent Promethazine  DM and albuterol .  Advised her that she should expect to be sick for a couple of weeks but to return for any worsening of symptoms.  Work note given.    Final Clinical Impressions(s) / UC Diagnoses   Final diagnoses:  Acute cough  Acute viral bronchitis  Chronic lung disease  Tobacco abuse     Discharge Instructions      - Chest was negative. - Your x-ray shows no pneumonia or signs of bacterial infection.  It does show chronic interstitial coarsening so you appear to have underlying chronic lung disease.  I advise you to follow-up with your PCP regarding this.  You may need a referral to pulmonology to have pulmonary function testing to further evaluate and treat this. - Cough related to bronchitis can last for up to 6 weeks.  Continue with the prednisone .  I sent cough medicine and an inhaler for you. - Return if not improving after a couple of weeks, fever or shortness of breath.     ED Prescriptions     Medication Sig Dispense Auth. Provider   albuterol  (VENTOLIN  HFA) 108 (90 Base) MCG/ACT inhaler Inhale 1-2 puffs into the lungs every 6 (six) hours as needed for wheezing or shortness of breath. 1 each Arvis Jolan NOVAK, PA-C   promethazine -dextromethorphan   (PROMETHAZINE -DM) 6.25-15 MG/5ML syrup Take 5 mLs by mouth 4 (four) times daily as needed. 118 mL Arvis Jolan NOVAK, PA-C      PDMP not reviewed this encounter.   Arvis Jolan NOVAK, PA-C 03/22/24 [phone]

## 2024-03-22 NOTE — ED Triage Notes (Signed)
 Pt c/o cough & congestion x4 days. Had E-visit on 6/29 for the same issue. Given PRED,tessalon  & Flonase  w/minor relief.

## 2024-03-25 ENCOUNTER — Telehealth: Admitting: Physician Assistant

## 2024-03-25 DIAGNOSIS — B9689 Other specified bacterial agents as the cause of diseases classified elsewhere: Secondary | ICD-10-CM

## 2024-03-25 MED ORDER — AMOXICILLIN-POT CLAVULANATE 875-125 MG PO TABS: 1.0000 | ORAL_TABLET | Freq: Two times a day (BID) | ORAL | 0 refills | Status: DC

## 2024-03-25 NOTE — Progress Notes (Signed)

## 2024-04-11 ENCOUNTER — Ambulatory Visit: Admitting: Pulmonary Disease

## 2024-04-11 ENCOUNTER — Encounter: Payer: Self-pay | Admitting: Pulmonary Disease

## 2024-04-11 VITALS — BP 138/88 | HR 108 | Ht 62.0 in | Wt 164.0 lb

## 2024-04-11 DIAGNOSIS — F17210 Nicotine dependence, cigarettes, uncomplicated: Secondary | ICD-10-CM | POA: Diagnosis not present

## 2024-04-11 DIAGNOSIS — J449 Chronic obstructive pulmonary disease, unspecified: Secondary | ICD-10-CM

## 2024-04-11 MED ORDER — TRELEGY ELLIPTA 100-62.5-25 MCG/ACT IN AEPB: 1.0000 | INHALATION_SPRAY | Freq: Every day | RESPIRATORY_TRACT | 0 refills | Status: DC

## 2024-04-11 MED ORDER — TRELEGY ELLIPTA 100-62.5-25 MCG/ACT IN AEPB: 1.0000 | INHALATION_SPRAY | Freq: Every day | RESPIRATORY_TRACT | 11 refills | Status: DC

## 2024-04-11 NOTE — Progress Notes (Signed)
 Subjective:    Patient ID: [name], female    DOB: 03/05/1976, 48 y.o.   MRN: [id number]  Patient Care Team: [facility], [facility] as PCP - General (General Practice) Cindie Jesusa HERO, RN as Registered Nurse Dannielle Arlean FALCON, RN (Inactive) as Registered Nurse  Chief Complaint  Patient presents with   Establish Care   Nicotine  Dependence    BACKGROUND: Patient is a 48 year old current smoker (1 PPD) who presents for evaluation of potential COPD and abnormal chest x-ray.  She is kindly referred by Harlene Patten, NP/[facility].  HPI Discussed the use of AI scribe software for clinical note transcription with the patient, who gave verbal consent to proceed.  History of Present Illness   [name] is a 48 year old female who presents with evaluation of COPD. She is accompanied by her husband and daughter.  She has a history of smoking, currently smoking a pack to a pack and a half per day. She experiences dyspnea, particularly during exertion, such as pushing a wheelchair uphill. Her breathing issues are intermittent but have been more noticeable recently. She occasionally experiences peripheral edema but denies frequent wheezing.  She has a family history of lung cancer, with her mother having undergone surgery for lung cancer and an aunt who passed away from the disease. She is concerned about her risk due to her family history and smoking habits. She denies a history of asthma but notes that asthma runs in her family, affecting her brother and father.  She reports using an inhaler given to her previously, but only four times, with minimal improvement in her symptoms.  This is albuterol .  She also received a breathing treatment in the past at urgent care without significant benefit.  She began smoking after her children were born, as she did not smoke during her pregnancies. She works as a Water engineer and is concerned about  her ability to perform her job duties due to her breathing difficulties.  She mentions a persistent cough that worsens when she is sick, cough mostly in the mornings productive of whitish sputum.  No hemoptysis.  She has no orthopnea, paroxysmal nocturnal dyspnea nor lower extremity edema.  No weight loss or anorexia.    As noted she works as a Water engineer.  She has no prior military history.   Review of Systems A 10 point review of systems was performed and it is as noted above otherwise negative.   Past Medical History:  Diagnosis Date   Depression    Focal nodular hyperplasia determined by liver biopsy    Leukocytosis    Tobacco use     Past Surgical History:  Procedure Laterality Date   ABDOMINAL HYSTERECTOMY     BREAST BIOPSY Right 2016   us  core, fat necrosis   COLONOSCOPY WITH PROPOFOL  N/A 03/25/2018   Procedure: COLONOSCOPY WITH PROPOFOL ;  Surgeon: Toledo, Ladell POUR, MD;  Location: [facility];  Service: Gastroenterology;  Laterality: N/A;   ESOPHAGOGASTRODUODENOSCOPY (EGD) WITH PROPOFOL  N/A 03/25/2018   Procedure: ESOPHAGOGASTRODUODENOSCOPY (EGD) WITH PROPOFOL ;  Surgeon: Toledo, Ladell POUR, MD;  Location: [facility];  Service: Gastroenterology;  Laterality: N/A;   INTRAUTERINE DEVICE INSERTION     IUD REMOVAL  08/29/2019   Procedure: INTRAUTERINE DEVICE (IUD) REMOVAL;  Surgeon: Janit Alm Agent, MD;  Location: [facility];  Service: Gynecology;;   KNEE CARTILAGE SURGERY Right 02/2015   LAPAROSCOPIC ASSISTED VAGINAL HYSTERECTOMY N/A 08/29/2019   Procedure: LAPAROSCOPIC  ASSISTED VAGINAL HYSTERECTOMY;  Surgeon: Janit Alm Agent, MD;  Location: [facility];  Service: Gynecology;  Laterality: N/A;   LAPAROSCOPIC BILATERAL SALPINGO OOPHERECTOMY Bilateral 08/29/2019   Procedure: LAPAROSCOPIC BILATERAL SALPINGO OOPHORECTOMY;  Surgeon: Janit Alm Agent, MD;  Location: [facility];  Service: Gynecology;  Laterality: Bilateral;   LAPAROSCOPIC TUBAL LIGATION      Patient Active  Problem List   Diagnosis Date Noted   Carpal tunnel syndrome of right wrist 02/13/2024   Low back pain 01/01/2024   Abdominal pain, lower 10/22/2023   Localized edema 07/23/2023   Hip pain, right 06/25/2023   Dental caries 06/05/2023   Hypertriglyceridemia 03/10/2023   Essential (primary) hypertension 09/23/2022   Thrombocytosis 05/15/2022   Erythrocytosis 05/15/2022   Steatosis of liver 03/06/2022   Increased frequency of urination 03/04/2022   Other specified anxiety disorders 07/24/2021   Postsurgical menopause 12/27/2020   Status post surgery 08/29/2019   Attention deficit hyperactivity disorder (ADHD), combined type 03/15/2019   Adrenal mass, left (HCC) 06/01/2018   GI bleed 03/23/2018   Knee pain, right 02/24/2018   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 01/01/2018   Hx of abnormal mammogram 01/01/2018   Tenosynovitis of wrist 07/17/2016   Depressive disorder 10/17/2015   Tobacco use disorder 03/31/2014   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   Leukocytosis 10/03/2008   Allergic rhinitis 10/03/2008    Family History  Problem Relation Age of Onset   Hypertension Mother    Prostate cancer Father    Hypertension Father    Breast cancer Maternal Grandmother 104   Colon cancer Neg Hx     Social History   Tobacco Use   Smoking status: Every Day    Current packs/day: 1.00    Average packs/day: 1 pack/day for 10.0 years (10.0 ttl pk-yrs)    Types: Cigarettes   Smokeless tobacco: Never  Substance Use Topics   Alcohol use: No    Alcohol/week: 0.0 standard drinks of alcohol    Allergies  Allergen Reactions   Hydrocodone  Other (See Comments)    Other Reaction(s): emesis (given after fall and injury R leg)  hydrocodone     Current Meds  Medication Sig   albuterol  (VENTOLIN  HFA) 108 (90 Base) MCG/ACT inhaler Inhale 1-2 puffs into the lungs every 6 (six) hours as needed for wheezing or shortness of breath.   buPROPion   (WELLBUTRIN  XL) 300 MG 24 hr tablet Take 300 mg by mouth daily.   fluticasone  (FLONASE ) 50 MCG/ACT nasal spray Place 2 sprays into both nostrils daily.   [START ON 04/25/2024] Fluticasone -Umeclidin-Vilant (TRELEGY ELLIPTA ) 100-62.5-25 MCG/ACT AEPB Inhale 1 puff into the lungs daily.   Fluticasone -Umeclidin-Vilant (TRELEGY ELLIPTA ) 100-62.5-25 MCG/ACT AEPB Inhale 1 puff into the lungs daily.   gabapentin (NEURONTIN) 100 MG capsule Take 100 mg by mouth 2 (two) times daily.   promethazine -dextromethorphan  (PROMETHAZINE -DM) 6.25-15 MG/5ML syrup Take 5 mLs by mouth 4 (four) times daily as needed.    Immunization History  Administered Date(s) Administered   Influenza,inj,Quad PF,6+ Mos 08/06/2021, 09/19/2022   Influenza,inj,quad, With Preservative 09/05/2015, 07/02/2016, 07/23/2016, 07/09/2017   Influenza-Unspecified 10/03/2008, 10/13/2012   Moderna Covid-19 Fall Seasonal Vaccine 20yrs & older 09/19/2022        Objective:     BP 138/88 (BP Location: Left Arm, Patient Position: Sitting, Cuff Size: Normal)   Pulse (!) 108   Ht 5' 2 (1.575 m)   Wt 164 lb (74.4 kg)   LMP 08/22/2019   SpO2 97%   BMI 30.00 kg/m  SpO2: 97 %  GENERAL: Overweight well-developed woman, no acute distress.  Fully ambulatory.  No conversational dyspnea. HEAD: Normocephalic, atraumatic.  EYES: Pupils equal, round, reactive to light.  No scleral icterus.  MOUTH: Poor dentition, few chipped teeth. NECK: Supple. No thyromegaly. Trachea midline. No JVD.  No adenopathy. PULMONARY: Good air entry bilaterally.  Coarse, otherwise, no adventitious sounds. CARDIOVASCULAR: S1 and S2. Regular rate and rhythm.  No rubs, murmurs or gallops heard. ABDOMEN: Benign. MUSCULOSKELETAL: No joint deformity, no clubbing, no edema.  NEUROLOGIC: No overt focal deficit, no gait disturbance, speech is fluent. SKIN: Intact,warm,dry. PSYCH: Mood and behavior normal.   Chest x-ray performed 22 March 2024 shows markings consistent with  COPD, no acute cardiopulmonary findings.       Assessment & Plan:     ICD-10-CM   1. COPD suggested by initial evaluation (HCC)  J44.9 Pulmonary function test    2. Tobacco dependence due to cigarettes  F17.210       Orders Placed This Encounter  Procedures   Pulmonary function test    Standing Status:   Future    Expected Date:   04/25/2024    Expiration Date:   04/11/2025    Where should this test be performed?:   Outpatient Pulmonary    What type of PFT is being ordered?:   Full PFT    Meds ordered this encounter  Medications   Fluticasone -Umeclidin-Vilant (TRELEGY ELLIPTA ) 100-62.5-25 MCG/ACT AEPB    Sig: Inhale 1 puff into the lungs daily.    Dispense:  28 each    Refill:  11   Fluticasone -Umeclidin-Vilant (TRELEGY ELLIPTA ) 100-62.5-25 MCG/ACT AEPB    Sig: Inhale 1 puff into the lungs daily.    Dispense:  2 each    Refill:  0   Discussion:    Chronic Obstructive Pulmonary Disease (COPD) COPD with interstitial prominence on chest x-ray, likely related to COPD changes from chronic tobacco use. Symptoms include dyspnea on exertion, particularly when pushing a wheelchair uphill. No significant wheezing reported. Smoking history of approximately one pack per day, sometimes up to one and a half packs. Minimal previous inhaler use, not perceived as beneficial. Family history of lung cancer noted, but she is currently too young for lung cancer screening CT, however, she may need high-resolution chest CT pending PFTs. Physical exam reveals coarse breath sounds, indicating airway inflammation. No pneumonia or other acute findings on imaging. Differential includes potential asthma, given family history, but no personal history of asthma treatment. - Order pulmonary function tests to assess lung function. - Prescribe Trelegy Ellipta  100 for daily use to manage COPD symptoms. Instruct to rinse mouth after use. - Continue use of albuterol  (Ventolin ) inhaler as needed for acute  symptoms. - Advise smoking cessation to improve lung health. - Schedule follow-up appointment in 6 to 8 weeks to reassess symptoms and treatment efficacy.  Tobacco dependence due to cigarettes Patient counseled regards to discontinuation of smoking total counseling time 8 to 10 minutes.  Advised if symptoms do not improve or worsen, to please contact office for sooner follow up or seek emergency care.    I spent 60 minutes of dedicated to the care of this patient on the date of this encounter to include pre-visit review of records, face-to-face time with the patient discussing conditions above, post visit ordering of testing, clinical documentation with the electronic health record, making appropriate referrals as documented, and communicating necessary findings to members of the patients care team.   C. Leita Sanders, MD  Advanced Bronchoscopy PCCM [facility]    *This note was dictated using voice recognition software/Dragon.  Despite best efforts to proofread, errors can occur which can change the meaning. Any transcriptional errors that result from this process are unintentional and may not be fully corrected at the time of dictation.

## 2024-04-11 NOTE — Patient Instructions (Addendum)
 VISIT SUMMARY:  Today, we discussed your breathing difficulties and evaluated your condition, which is consistent with Chronic Obstructive Pulmonary Disease (COPD). We reviewed your symptoms, smoking history, and family history of lung issues. We also discussed your current treatment and future steps to manage your condition.  YOUR PLAN:  -CHRONIC OBSTRUCTIVE PULMONARY DISEASE (COPD): COPD is a chronic lung disease that makes it hard to breathe due to airway inflammation and damage. To manage your symptoms, we will start you on Trelegy Ellipta  100, 1 puff daily.  You should rinse your mouth after each use. Continue using your albuterol  inhaler as needed for sudden symptoms. We also recommend that you stop smoking to help improve your lung health. We will perform pulmonary function tests to assess your lung function and schedule a follow-up appointment in 6 to 8 weeks to see how you are doing.  INSTRUCTIONS:  Please schedule a follow-up appointment in 6 to 8 weeks to reassess your symptoms and the effectiveness of your treatment. Additionally, we will conduct pulmonary function tests to better understand your lung health.

## 2024-04-14 ENCOUNTER — Telehealth: Admitting: Physician Assistant

## 2024-04-14 ENCOUNTER — Encounter: Payer: Self-pay | Admitting: Pulmonary Disease

## 2024-04-14 DIAGNOSIS — J45901 Unspecified asthma with (acute) exacerbation: Secondary | ICD-10-CM

## 2024-04-14 MED ORDER — BENZONATATE 100 MG PO CAPS: 100.0000 mg | ORAL_CAPSULE | Freq: Three times a day (TID) | ORAL | 0 refills | Status: DC | PRN

## 2024-04-14 MED ORDER — PREDNISONE 20 MG PO TABS: 40.0000 mg | ORAL_TABLET | Freq: Every day | ORAL | 0 refills | Status: DC

## 2024-04-14 NOTE — Progress Notes (Signed)
 E-Visit for Asthma  Based on what you have shared with me, it looks like you may have a flare up of your asthma.  Asthma is a chronic (ongoing) lung disease which results in airway obstruction, inflammation and hyper-responsiveness.   Asthma symptoms vary from person to person, with common symptoms including nighttime awakening and decreased ability to participate in normal activities as a result of shortness of breath. It is often triggered by changes in weather, changes in the season, changes in air temperature, or inside (home, school, daycare or work) allergens such as animal dander, mold, mildew, woodstoves or cockroaches.   It can also be triggered by hormonal changes, extreme emotion, physical exertion or an upper respiratory tract illness.     It is important to identify the trigger, and then eliminate or avoid the trigger if possible.   If you have been prescribed medications to be taken on a regular basis, it is important to follow the asthma action plan and to follow guidelines to adjust medication in response to increasing symptoms of decreased peak expiratory flow rate  Treatment: I have prescribed: Prednisone  40mg  by mouth per day for 5 - 7 days. I have also prescribed Tessalon  for cough. It is ok for you to continue Gabapentin but hold off on the Ibuprofen  until you have completed the Prednisone .   I have sent a work note to Pharmacologist. You can find by going to the Menu on your homepage, scrolling down to the Communications section, and selecting Letters. Let us  know if you have any issue locating. Take care and feel better soon!   HOME CARE Only take medications as instructed by your medical team. Consider wearing a mask or scarf to improve breathing air temperature have been shown to decrease irritation and decrease exacerbations Get rest. Taking a steamy shower or  using a humidifier may help nasal congestion sand ease sore throat pain. You can place a towel over your head and breathe in the steam from hot water coming from a faucet. Using a saline nasal spray works much the same way.  Cough drops, hare candies and sore throat lozenges may ease your cough.  Avoid close contacts especially the very you and the elderly Cover your mouth if you cough or sneeze Always remember to wash your hands.    GET HELP RIGHT AWAY IF: You develop worsening symptoms; breathlessness at rest, drowsy, confused or agitated, unable to speak in full sentences You have coughing fits You develop a severe headache or visual changes You develop shortness of breath, difficulty breathing or start having chest pain Your symptoms persist after you have completed your treatment plan If your symptoms do not improve within 10 days  MAKE SURE YOU Understand these instructions. Will watch your condition. Will get help right away if you are not doing well or get worse.   Your e-visit answers were reviewed by a board certified advanced clinical practitioner to complete your personal care plan, Depending upon the condition, your plan could have included both over the counter or prescription medications.   Please review your pharmacy choice. Your safety is important to us . If you have drug allergies check your prescription carefully.  You can use MyChart to ask questions about today's visit, request a non-urgent  call back, or ask for a work or school excuse for 24 hours related to this e-Visit. If it has been greater than 24 hours you will need to follow up with your provider, or enter a new e-Visit  to address those concerns.   You will get an e-mail in the next two days asking about your experience. I hope that your e-visit has been valuable and will speed your recovery. Thank you for using e-visits.

## 2024-04-14 NOTE — Progress Notes (Signed)
 I have spent 5 minutes in review of e-visit questionnaire, review and updating patient chart, medical decision making and response to patient.   Piedad Climes, PA-C

## 2024-04-14 NOTE — Telephone Encounter (Signed)
 If she rinsing her mouth well after use?  If she is not and is still having the issue we can try providing her with samples of Breztri 2 puffs twice a day or we can call the [facility] into her pharmacy.  Any inhaler she uses she should rinse her mouth well after use.

## 2024-04-15 ENCOUNTER — Encounter: Payer: Self-pay | Admitting: Pulmonary Disease

## 2024-04-15 MED ORDER — BREZTRI AEROSPHERE 160-9-4.8 MCG/ACT IN AERO: 2.0000 | INHALATION_SPRAY | Freq: Two times a day (BID) | RESPIRATORY_TRACT | 11 refills | Status: AC

## 2024-05-02 ENCOUNTER — Other Ambulatory Visit: Payer: Self-pay | Admitting: Medical Genetics

## 2024-05-03 ENCOUNTER — Other Ambulatory Visit

## 2024-05-04 ENCOUNTER — Other Ambulatory Visit

## 2024-05-04 ENCOUNTER — Encounter: Payer: Self-pay | Admitting: Pulmonary Disease

## 2024-05-04 DIAGNOSIS — Z1389 Encounter for screening for other disorder: Secondary | ICD-10-CM | POA: Diagnosis not present

## 2024-05-04 DIAGNOSIS — I10 Essential (primary) hypertension: Secondary | ICD-10-CM | POA: Diagnosis not present

## 2024-05-04 DIAGNOSIS — F418 Other specified anxiety disorders: Secondary | ICD-10-CM | POA: Diagnosis not present

## 2024-05-04 DIAGNOSIS — Z0131 Encounter for examination of blood pressure with abnormal findings: Secondary | ICD-10-CM | POA: Diagnosis not present

## 2024-05-04 DIAGNOSIS — R5383 Other fatigue: Secondary | ICD-10-CM | POA: Diagnosis not present

## 2024-05-04 DIAGNOSIS — R350 Frequency of micturition: Secondary | ICD-10-CM | POA: Diagnosis not present

## 2024-05-04 NOTE — Telephone Encounter (Signed)
 This would be best addressed by her primary care physician.

## 2024-05-06 ENCOUNTER — Other Ambulatory Visit

## 2024-05-12 ENCOUNTER — Ambulatory Visit: Admitting: Pulmonary Disease

## 2024-05-24 ENCOUNTER — Other Ambulatory Visit

## 2024-06-16 ENCOUNTER — Ambulatory Visit: Admitting: Pulmonary Disease

## 2024-06-16 ENCOUNTER — Ambulatory Visit (INDEPENDENT_AMBULATORY_CARE_PROVIDER_SITE_OTHER): Admitting: Pulmonary Disease

## 2024-06-16 ENCOUNTER — Encounter: Payer: Self-pay | Admitting: Pulmonary Disease

## 2024-06-16 VITALS — BP 140/88 | HR 93 | Temp 97.6°F | Ht 62.0 in | Wt 160.8 lb

## 2024-06-16 DIAGNOSIS — J4489 Other specified chronic obstructive pulmonary disease: Secondary | ICD-10-CM

## 2024-06-16 DIAGNOSIS — J449 Chronic obstructive pulmonary disease, unspecified: Secondary | ICD-10-CM

## 2024-06-16 DIAGNOSIS — Z23 Encounter for immunization: Secondary | ICD-10-CM | POA: Diagnosis not present

## 2024-06-16 DIAGNOSIS — R9389 Abnormal findings on diagnostic imaging of other specified body structures: Secondary | ICD-10-CM | POA: Diagnosis not present

## 2024-06-16 DIAGNOSIS — F17210 Nicotine dependence, cigarettes, uncomplicated: Secondary | ICD-10-CM | POA: Diagnosis not present

## 2024-06-16 DIAGNOSIS — R0602 Shortness of breath: Secondary | ICD-10-CM | POA: Diagnosis not present

## 2024-06-16 LAB — PULMONARY FUNCTION TEST
DL/VA % pred: 126 %
DL/VA: 5.55 ml/min/mmHg/L
DLCO unc % pred: 101 %
DLCO unc: 20.23 ml/min/mmHg
FEF 25-75 Post: 0.86 L/s
FEF 25-75 Pre: 0.56 L/s
FEF2575-%Change-Post: 55 %
FEF2575-%Pred-Post: 30 %
FEF2575-%Pred-Pre: 19 %
FEV1-%Change-Post: 12 %
FEV1-%Pred-Post: 51 %
FEV1-%Pred-Pre: 45 %
FEV1-Post: 1.39 L
FEV1-Pre: 1.24 L
FEV1FVC-%Change-Post: 13 %
FEV1FVC-%Pred-Pre: 67 %
FEV6-%Change-Post: -2 %
FEV6-%Pred-Post: 66 %
FEV6-%Pred-Pre: 68 %
FEV6-Post: 2.2 L
FEV6-Pre: 2.25 L
FEV6FVC-%Change-Post: 0 %
FEV6FVC-%Pred-Post: 102 %
FEV6FVC-%Pred-Pre: 101 %
FVC-%Change-Post: -1 %
FVC-%Pred-Post: 66 %
FVC-%Pred-Pre: 67 %
FVC-Post: 2.25 L
FVC-Pre: 2.27 L
Post FEV1/FVC ratio: 62 %
Post FEV6/FVC ratio: 100 %
Pre FEV1/FVC ratio: 55 %
Pre FEV6/FVC Ratio: 99 %
RV % pred: 94 %
RV: 1.51 L
TLC % pred: 81 %
TLC: 3.89 L

## 2024-06-16 NOTE — Progress Notes (Signed)
 Full PFT completed today ? ?

## 2024-06-16 NOTE — Progress Notes (Unsigned)
 Subjective:    Patient ID: [name], female    DOB: Sep 09, 1976, 48 y.o.   MRN: [id number]  Patient Care Team: Center, [facility] as PCP - General (General Practice) Jasmine Jesusa HERO, RN as Registered Nurse Jasmine Arlean FALCON, RN (Inactive) as Registered Nurse  Chief Complaint  Patient presents with   COPD    Occasional cough, shortness of breath and wheezing.    BACKGROUND/INTERVAL: Patient is a 48 year old current smoker (1 PPD) who presents for follow-up of potential COPD and abnormal chest x-ray.   HPI   Review of Systems A 10 point review of systems was performed and it is as noted above otherwise negative.   Patient Active Problem List   Diagnosis Date Noted   Carpal tunnel syndrome of right wrist 02/13/2024   Low back pain 01/01/2024   Abdominal pain, lower 10/22/2023   Localized edema 07/23/2023   Hip pain, right 06/25/2023   Dental caries 06/05/2023   Hypertriglyceridemia 03/10/2023   Essential (primary) hypertension 09/23/2022   Thrombocytosis 05/15/2022   Erythrocytosis 05/15/2022   Steatosis of liver 03/06/2022   Increased frequency of urination 03/04/2022   Other specified anxiety disorders 07/24/2021   Postsurgical menopause 12/27/2020   Status post surgery 08/29/2019   Attention deficit hyperactivity disorder (ADHD), combined type 03/15/2019   Adrenal mass, 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   GI bleed 03/23/2018   Knee pain, right 02/24/2018   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 01/01/2018   Hx of abnormal mammogram 01/01/2018   Tenosynovitis of wrist 07/17/2016   Depressive disorder 10/17/2015   Tobacco use disorder 03/31/2014   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   Leukocytosis 10/03/2008   Allergic rhinitis 10/03/2008    Social History   Tobacco Use   Smoking status: Every Day    Current packs/day: 1.00    Average packs/day: 1 pack/day for 20.7 years (20.7 ttl pk-yrs)    Types:  Cigarettes    Start date: 2005   Smokeless tobacco: Never  Substance Use Topics   Alcohol use: No    Alcohol/week: 0.0 standard drinks of alcohol    Allergies  Allergen Reactions   Hydrocodone  Other (See Comments)    Other Reaction(s): emesis (given after fall and injury R leg)  hydrocodone     Current Meds  Medication Sig   albuterol  (VENTOLIN  HFA) 108 (90 Base) MCG/ACT inhaler Inhale 1-2 puffs into the lungs every 6 (six) hours as needed for wheezing or shortness of breath.   benzonatate  (TESSALON ) 100 MG capsule Take 1 capsule (100 mg total) by mouth 3 (three) times daily as needed for cough.   budesonide-glycopyrrolate -formoterol (BREZTRI  AEROSPHERE) 160-9-4.8 MCG/ACT AERO inhaler Inhale 2 puffs into the lungs in the morning and at bedtime.   gabapentin (NEURONTIN) 300 MG capsule Take 300 mg by mouth at bedtime.   predniSONE  (DELTASONE ) 20 MG tablet Take 2 tablets (40 mg total) by mouth daily with breakfast.   [DISCONTINUED] gabapentin (NEURONTIN) 100 MG capsule Take 100 mg by mouth 2 (two) times daily.    Immunization History  Administered Date(s) Administered   Influenza,inj,Quad PF,6+ Mos 08/06/2021, 09/19/2022   Influenza,inj,quad, With Preservative 09/05/2015, 07/02/2016, 07/23/2016, 07/09/2017   Influenza-Unspecified 10/03/2008, 10/13/2012   Moderna Covid-19 Fall Seasonal Vaccine 23yrs & older 09/19/2022        Objective:     BP (!) 140/88   Pulse 93   Temp 97.6 F (36.4 C) (Temporal)   Ht 5' 2 (1.575 m)   Wt  160 lb 12.8 oz (72.9 kg)   LMP 08/22/2019   SpO2 99%   BMI 29.41 kg/m   SpO2: 99 %  GENERAL: HEAD: Normocephalic, atraumatic.  EYES: Pupils equal, round, reactive to light.  No scleral icterus.  MOUTH:  NECK: Supple. No thyromegaly. Trachea midline. No JVD.  No adenopathy. PULMONARY: Good air entry bilaterally.  No adventitious sounds. CARDIOVASCULAR: S1 and S2. Regular rate and rhythm.  ABDOMEN: MUSCULOSKELETAL: No joint deformity, no  clubbing, no edema.  NEUROLOGIC:  SKIN: Intact,warm,dry. PSYCH:        Assessment & Plan:     ICD-10-CM   1. Stage 2 moderate COPD by GOLD classification (HCC)  J44.9     2. COPD with asthma (HCC)  J44.89     3. Abnormal CXR (chest x-ray)  R93.89     4. Tobacco dependence due to cigarettes  F17.210       No orders of the defined types were placed in this encounter.   No orders of the defined types were placed in this encounter.  Smoking/Tobacco Cessation Counseling [name] is a current user of tobacco or nicotine  products. She is not ready to quit at this time. Counseling provided today addressed the risks of continued use and the benefits of cessation. Discussed tobacco/nicotine  use history, readiness to quit, and evidence-based treatment options including behavioral strategies, support resources, and pharmacologic therapies. Provided encouragement and educational materials on steps and resources to quit smoking. Patient questions were addressed, and follow-up recommended for continued support. Total time spent on counseling: 5 minutes.  .    Advised if symptoms do not improve or worsen, to please contact office for sooner follow up or seek emergency care.    I spent xxx minutes of dedicated to the care of this patient on the date of this encounter to include pre-visit review of records, face-to-face time with the patient discussing conditions above, post visit ordering of testing, clinical documentation with the electronic health record, making appropriate referrals as documented, and communicating necessary findings to members of the patients care team.     C. Leita Sanders, MD Advanced Bronchoscopy PCCM [facility]    *This note was generated using voice recognition software/Dragon and/or AI transcription program.  Despite best efforts to proofread, errors can occur which can change the meaning. Any transcriptional errors that result from  this process are unintentional and may not be fully corrected at the time of dictation.

## 2024-06-16 NOTE — Patient Instructions (Signed)
 VISIT SUMMARY:  Today, we discussed your chronic obstructive pulmonary disease (COPD) and the importance of consistent medication use, smoking cessation, and further investigation of your lung health. We also addressed the need for a flu shot and screening for a possible hereditary condition.  YOUR PLAN:  -CHRONIC OBSTRUCTIVE PULMONARY DISEASE WITH ASTHMA COMPONENT: COPD is a chronic lung disease that makes it hard to breathe. You need to use your Breztri  inhaler two puffs twice a day consistently to improve your lung function. Make sure to use a spacer with the inhaler and rinse your mouth well after each use. Also, confirm that you have a rescue inhaler available for emergencies.  -TOBACCO USE DISORDER: Continued smoking is harmful to your lungs and overall health. Quitting smoking is the most important step you can take to improve your lung function and manage your COPD.  -ABNORMAL CHEST X-RAY: Your chest x-ray showed some abnormal findings that need further investigation. We will schedule a CT scan of your chest to get a detailed look at your lungs.  -SCREENING FOR HEREDITARY EMPHYSEMA (ALPHA-1 ANTITRYPSIN DEFICIENCY): We need to check for a hereditary condition that can affect your lungs. You will have a blood test for alpha-1 antitrypsin deficiency at the main hospital lab.  -INFLUENZA VACCINATION: Getting your flu shot is important to protect you from the seasonal flu. You will receive the influenza vaccination today.  INSTRUCTIONS:  Please follow up with the CT scan of your chest as scheduled. Also, make sure to get the blood test for alpha-1 antitrypsin deficiency at the main hospital lab. Continue using your Breztri  inhaler as directed and work on quitting smoking. If you have any questions or concerns, please contact our office.

## 2024-06-16 NOTE — Patient Instructions (Signed)
 Full PFT completed today ? ?

## 2024-06-17 ENCOUNTER — Other Ambulatory Visit
Admission: RE | Admit: 2024-06-17 | Discharge: 2024-06-17 | Disposition: A | Discharge status: Home / Self Care | Attending: Pulmonary Disease | Admitting: Pulmonary Disease

## 2024-06-17 ENCOUNTER — Encounter: Payer: Self-pay | Admitting: Pulmonary Disease

## 2024-06-17 ENCOUNTER — Ambulatory Visit: Payer: Self-pay | Admitting: Pulmonary Disease

## 2024-06-17 DIAGNOSIS — R0602 Shortness of breath: Secondary | ICD-10-CM | POA: Diagnosis not present

## 2024-06-17 DIAGNOSIS — J449 Chronic obstructive pulmonary disease, unspecified: Secondary | ICD-10-CM | POA: Insufficient documentation

## 2024-06-17 NOTE — Telephone Encounter (Signed)
 I am sorry that you feel that not enough time was spent to explain things.  If it anytime you feel that way during a visit you are free to say please can you expand on that and I will be more than happy to do so.  If you look at your After Visit Summary everything that was discussed is in the summary, this is a direct transcription from our note generating program on the chart.  So all of those topics were discussed.  Please feel free to make another appointment if you want to discuss further.  For smoking cessation I would consider patches as safe for step if you want to try quitting again.  You expressed that you had tried quitting before and had not been successful.  You may call 1 800 QUIT NOW they are a good source and can help you with your smoking cessation.

## 2024-06-17 NOTE — Telephone Encounter (Signed)
 As we discussed yesterday, she has very poor lung function for someone so young.  She needs to quit smoking.  The CT is looking for potential associated scarring in the lungs or a condition called smokers bronchiolitis, her chest x-ray in the past has shown some haziness and I just want to confirm that that is not an issue.  I was waiting for her lung function testing which we got yesterday and therefore I have requested the CT.  The blood work is looking for potential causes of emphysema that is hereditary it is: Alpha 1 antitrypsin level.  We can generate a letter stating that she has COPD and asthma.

## 2024-06-20 ENCOUNTER — Encounter: Payer: Self-pay | Admitting: Pulmonary Disease

## 2024-06-20 NOTE — Telephone Encounter (Signed)
 This scan should give us  a pretty good view of that if there is any additional images needed we could add those at a later time but I think we should be able to see an off on the scan by itself.

## 2024-06-21 ENCOUNTER — Ambulatory Visit
Admission: RE | Admit: 2024-06-21 | Discharge: 2024-06-21 | Disposition: A | Discharge status: Home / Self Care | Source: Ambulatory Visit | Attending: Pulmonary Disease | Admitting: Pulmonary Disease

## 2024-06-21 ENCOUNTER — Ambulatory Visit: Payer: Self-pay | Admitting: Pulmonary Disease

## 2024-06-21 DIAGNOSIS — R0602 Shortness of breath: Secondary | ICD-10-CM | POA: Diagnosis not present

## 2024-06-21 DIAGNOSIS — R918 Other nonspecific abnormal finding of lung field: Secondary | ICD-10-CM

## 2024-06-21 DIAGNOSIS — J4489 Other specified chronic obstructive pulmonary disease: Secondary | ICD-10-CM

## 2024-06-21 DIAGNOSIS — R9389 Abnormal findings on diagnostic imaging of other specified body structures: Secondary | ICD-10-CM | POA: Insufficient documentation

## 2024-06-22 ENCOUNTER — Encounter: Payer: Self-pay | Admitting: Pulmonary Disease

## 2024-06-22 MED ORDER — ALBUTEROL SULFATE HFA 108 (90 BASE) MCG/ACT IN AERS: 1.0000 | INHALATION_SPRAY | Freq: Four times a day (QID) | RESPIRATORY_TRACT | 11 refills | Status: AC | PRN

## 2024-06-22 NOTE — Telephone Encounter (Signed)
 Albuterol  inhaler has been sent in to the [facility] pharmacy on file.

## 2024-06-22 NOTE — Addendum Note (Signed)
 Addended by: Lawson Isabell J on: 06/22/2024 11:14 AM   Modules accepted: Orders

## 2024-06-22 NOTE — Telephone Encounter (Signed)
 1) She has COPD 2) She has to use Breztri  2 puffs twice a day, rinse mouth well after use 3) The findings on the lungs can be due to hypersensitivity pneumonitis (allergic type reaction), sarcoidosis or respiratory bronchiolitis (smoker's bronchiolitis) - this is why the blood work is being done. 4) Bloodwork takes a few days to come back. 5) Any further questions will need to be addressed on follow up appointment

## 2024-06-22 NOTE — Telephone Encounter (Signed)
 Yes she needs to continue her current inhaler therapy because she has COPD as well.  Currently there is no other testing necessary other than what will be requested for sarcoidosis (ACE level) and hypersensitivity pneumonitis panel.  Will also get a sed rate and CRP which are inflammatory markers.  Her current medications do not raise any red flags with regards to her lung findings.  We can send in a prescription for her albuterol  inhaler.

## 2024-06-23 ENCOUNTER — Encounter: Payer: Self-pay | Admitting: Pulmonary Disease

## 2024-06-23 LAB — ALPHA-1-ANTITRYPSIN PHENOTYP: A-1 Antitrypsin, Ser: 133 mg/dL (ref 101–187)

## 2024-06-23 NOTE — Telephone Encounter (Signed)
 I will discuss the blood work when I get ALL of it.  That way I can put it all into context.

## 2024-06-27 ENCOUNTER — Encounter: Payer: Self-pay | Admitting: Pulmonary Disease

## 2024-06-27 DIAGNOSIS — J849 Interstitial pulmonary disease, unspecified: Secondary | ICD-10-CM

## 2024-06-28 ENCOUNTER — Telehealth: Admitting: Family Medicine

## 2024-06-28 ENCOUNTER — Telehealth: Admitting: Physician Assistant

## 2024-06-28 DIAGNOSIS — U071 COVID-19: Secondary | ICD-10-CM

## 2024-06-28 DIAGNOSIS — J069 Acute upper respiratory infection, unspecified: Secondary | ICD-10-CM

## 2024-06-28 LAB — CBC WITH DIFFERENTIAL/PLATELET
Basophils Absolute: 0.1 x10E3/uL (ref 0.0–0.2)
Basos: 1 %
EOS (ABSOLUTE): 0.5 x10E3/uL — ABNORMAL HIGH (ref 0.0–0.4)
Eos: 4 %
Hematocrit: 45.7 % (ref 34.0–46.6)
Hemoglobin: 15.9 g/dL (ref 11.1–15.9)
Immature Grans (Abs): 0 x10E3/uL (ref 0.0–0.1)
Immature Granulocytes: 0 %
Lymphocytes Absolute: 3.5 x10E3/uL — ABNORMAL HIGH (ref 0.7–3.1)
Lymphs: 23 %
MCH: 31.4 pg (ref 26.6–33.0)
MCHC: 34.8 g/dL (ref 31.5–35.7)
MCV: 90 fL (ref 79–97)
Monocytes Absolute: 0.9 x10E3/uL (ref 0.1–0.9)
Monocytes: 6 %
Neutrophils Absolute: 10.5 x10E3/uL — ABNORMAL HIGH (ref 1.4–7.0)
Neutrophils: 66 %
Platelets: 465 x10E3/uL — ABNORMAL HIGH (ref 150–450)
RBC: 5.07 x10E6/uL (ref 3.77–5.28)
RDW: 13.2 % (ref 11.7–15.4)
WBC: 15.5 x10E3/uL — ABNORMAL HIGH (ref 3.4–10.8)

## 2024-06-28 LAB — SEDIMENTATION RATE: Sed Rate: 30 mm/h (ref 0–32)

## 2024-06-28 LAB — HYPERSENSITIVITY PNEUMONITIS

## 2024-06-28 LAB — ANGIOTENSIN CONVERTING ENZYME: Angio Convert Enzyme: 95 U/L — ABNORMAL HIGH (ref 14–82)

## 2024-06-28 LAB — C-REACTIVE PROTEIN: CRP: 3 mg/L (ref 0–10)

## 2024-06-28 MED ORDER — NIRMATRELVIR/RITONAVIR (PAXLOVID)TABLET: 3.0000 | ORAL_TABLET | Freq: Two times a day (BID) | ORAL | 0 refills | Status: AC

## 2024-06-28 MED ORDER — BENZONATATE 100 MG PO CAPS: 100.0000 mg | ORAL_CAPSULE | Freq: Three times a day (TID) | ORAL | 0 refills | Status: DC | PRN

## 2024-06-28 MED ORDER — FLUTICASONE PROPIONATE 50 MCG/ACT NA SUSP
2.0000 | Freq: Every day | NASAL | 0 refills | Status: AC
Start: 2024-06-28 — End: ?

## 2024-06-28 NOTE — Patient Instructions (Signed)
 [name], thank you for joining Jasmine CHRISTELLA Barefoot, NP for today's virtual visit.  While this provider is not your primary care provider (PCP), if your PCP is located in our provider database this encounter information will be shared with them immediately following your visit.   A [facility] MyChart account gives you access to today's visit and all your visits, tests, and labs performed at [facility]  click here if you don't have a [facility] MyChart account or go to [email or web address]  Consent: (Patient) [name] provided verbal consent for this virtual visit at the beginning of the encounter.  Current Medications:  Current Outpatient Medications:    fluticasone  (FLONASE ) 50 MCG/ACT nasal spray, Place 2 sprays into both nostrils daily., Disp: 16 g, Rfl: 0   nirmatrelvir/ritonavir (PAXLOVID) 20 x 150 MG & 10 x 100MG  TABS, Take 3 tablets by mouth 2 (two) times daily for 5 days. (Take nirmatrelvir 150 mg two tablets twice daily for 5 days and ritonavir 100 mg one tablet twice daily for 5 days) Patient GFR is 110 8/25, Disp: 30 tablet, Rfl: 0   albuterol  (VENTOLIN  HFA) 108 (90 Base) MCG/ACT inhaler, Inhale 1-2 puffs into the lungs every 6 (six) hours as needed for wheezing or shortness of breath (or cough)., Disp: 1 each, Rfl: 11   benzonatate  (TESSALON ) 100 MG capsule, Take 1 capsule (100 mg total) by mouth 3 (three) times daily as needed for cough., Disp: 30 capsule, Rfl: 0   budesonide-glycopyrrolate -formoterol (BREZTRI  AEROSPHERE) 160-9-4.8 MCG/ACT AERO inhaler, Inhale 2 puffs into the lungs in the morning and at bedtime., Disp: 10.7 g, Rfl: 11   gabapentin (NEURONTIN) 300 MG capsule, Take 300 mg by mouth at bedtime., Disp: , Rfl:    predniSONE  (DELTASONE ) 20 MG tablet, Take 2 tablets (40 mg total) by mouth daily with breakfast., Disp: 10 tablet, Rfl: 0   Medications ordered in this encounter:  Meds ordered this encounter  Medications    nirmatrelvir/ritonavir (PAXLOVID) 20 x 150 MG & 10 x 100MG  TABS    Sig: Take 3 tablets by mouth 2 (two) times daily for 5 days. (Take nirmatrelvir 150 mg two tablets twice daily for 5 days and ritonavir 100 mg one tablet twice daily for 5 days) Patient GFR is 110 8/25    Dispense:  30 tablet    Refill:  0    Supervising Provider:   BLAISE ALEENE KIDD [[id number]]   benzonatate  (TESSALON ) 100 MG capsule    Sig: Take 1 capsule (100 mg total) by mouth 3 (three) times daily as needed for cough.    Dispense:  30 capsule    Refill:  0    Supervising Provider:   LAMPTEY, PHILIP O [[id number]]   fluticasone  (FLONASE ) 50 MCG/ACT nasal spray    Sig: Place 2 sprays into both nostrils daily.    Dispense:  16 g    Refill:  0    Supervising Provider:   BLAISE ALEENE KIDD [[id number]]     *If you need refills on other medications prior to your next appointment, please contact your pharmacy*  Follow-Up: Call back or seek an in-person evaluation if the symptoms worsen or if the condition fails to improve as anticipated.  [facility] [phone]  Care Instructions:   Isolation Instructions: You are to isolate at home until you have been fever free for at least 24 hours without a fever-reducing medication, and symptoms have been steadily improving for 24 hours. At that time,  you can end isolation but need to mask for an additional 5 days.   If you must be around other household members who do not have symptoms, you need to make sure that both you and the family members are masking consistently with a high-quality mask.  If you note any worsening of symptoms despite treatment, please seek an in-person evaluation ASAP. If you note any significant shortness of breath or any chest pain, please seek ER evaluation. Please do not delay care!   COVID-19: What to Do if You Are Sick If you test positive and are an older adult or someone who is at high risk of getting very sick from COVID-19, treatment  may be available. Contact a healthcare provider right away after a positive test to determine if you are eligible, even if your symptoms are mild right now. You can also visit a Test to Treat location and, if eligible, receive a prescription from a provider. Don't delay: Treatment must be started within the first few days to be effective. If you have a fever, cough, or other symptoms, you might have COVID-19. Most people have mild illness and are able to recover at home. If you are sick: Keep track of your symptoms. If you have an emergency warning sign (including trouble breathing), call 911. Steps to help prevent the spread of COVID-19 if you are sick If you are sick with COVID-19 or think you might have COVID-19, follow the steps below to care for yourself and to help protect other people in your home and community. Stay home except to get medical care Stay home. Most people with COVID-19 have mild illness and can recover at home without medical care. Do not leave your home, except to get medical care. Do not visit public areas and do not go to places where you are unable to wear a mask. Take care of yourself. Get rest and stay hydrated. Take over-the-counter medicines, such as acetaminophen , to help you feel better. Stay in touch with your doctor. Call before you get medical care. Be sure to get care if you have trouble breathing, or have any other emergency warning signs, or if you think it is an emergency. Avoid public transportation, ride-sharing, or taxis if possible. Get tested If you have symptoms of COVID-19, get tested. While waiting for test results, stay away from others, including staying apart from those living in your household. Get tested as soon as possible after your symptoms start. Treatments may be available for people with COVID-19 who are at risk for becoming very sick. Don't delay: Treatment must be started early to be effective--some treatments must begin within 5 days of your  first symptoms. Contact your healthcare provider right away if your test result is positive to determine if you are eligible. Self-tests are one of several options for testing for the virus that causes COVID-19 and may be more convenient than laboratory-based tests and point-of-care tests. Ask your healthcare provider or your local health department if you need help interpreting your test results. You can visit your state, tribal, local, and territorial health department's website to look for the latest local information on testing sites. Separate yourself from other people As much as possible, stay in a specific room and away from other people and pets in your home. If possible, you should use a separate bathroom. If you need to be around other people or animals in or outside of the home, wear a well-fitting mask. Tell your close contacts that they may  have been exposed to COVID-19. An infected person can spread COVID-19 starting 48 hours (or 2 days) before the person has any symptoms or tests positive. By letting your close contacts know they may have been exposed to COVID-19, you are helping to protect everyone. See COVID-19 and Animals if you have questions about pets. If you are diagnosed with COVID-19, someone from the health department may call you. Answer the call to slow the spread. Monitor your symptoms Symptoms of COVID-19 include fever, cough, or other symptoms. Follow care instructions from your healthcare provider and local health department. Your local health authorities may give instructions on checking your symptoms and reporting information. When to seek emergency medical attention Look for emergency warning signs* for COVID-19. If someone is showing any of these signs, seek emergency medical care immediately: Trouble breathing Persistent pain or pressure in the chest New confusion Inability to wake or stay awake Pale, gray, or blue-colored skin, lips, or nail beds, depending on  skin tone *This list is not all possible symptoms. Please call your medical provider for any other symptoms that are severe or concerning to you. Call 911 or call ahead to your local emergency facility: Notify the operator that you are seeking care for someone who has or may have COVID-19. Call ahead before visiting your doctor Call ahead. Many medical visits for routine care are being postponed or done by phone or telemedicine. If you have a medical appointment that cannot be postponed, call your doctor's office, and tell them you have or may have COVID-19. This will help the office protect themselves and other patients. If you are sick, wear a well-fitting mask You should wear a mask if you must be around other people or animals, including pets (even at home). Wear a mask with the best fit, protection, and comfort for you. You don't need to wear the mask if you are alone. If you can't put on a mask (because of trouble breathing, for example), cover your coughs and sneezes in some other way. Try to stay at least 6 feet away from other people. This will help protect the people around you. Masks should not be placed on young children under age 73 years, anyone who has trouble breathing, or anyone who is not able to remove the mask without help. Cover your coughs and sneezes Cover your mouth and nose with a tissue when you cough or sneeze. Throw away used tissues in a lined trash can. Immediately wash your hands with soap and water for at least 20 seconds. If soap and water are not available, clean your hands with an alcohol-based hand sanitizer that contains at least 60% alcohol. Clean your hands often Wash your hands often with soap and water for at least 20 seconds. This is especially important after blowing your nose, coughing, or sneezing; going to the bathroom; and before eating or preparing food. Use hand sanitizer if soap and water are not available. Use an alcohol-based hand sanitizer with at  least 60% alcohol, covering all surfaces of your hands and rubbing them together until they feel dry. Soap and water are the best option, especially if hands are visibly dirty. Avoid touching your eyes, nose, and mouth with unwashed hands. Handwashing Tips Avoid sharing personal household items Do not share dishes, drinking glasses, cups, eating utensils, towels, or bedding with other people in your home. Wash these items thoroughly after using them with soap and water or put in the dishwasher. Clean surfaces in your home regularly Clean  and disinfect high-touch surfaces (for example, doorknobs, tables, handles, light switches, and countertops) in your sick room and bathroom. In shared spaces, you should clean and disinfect surfaces and items after each use by the person who is ill. If you are sick and cannot clean, a caregiver or other person should only clean and disinfect the area around you (such as your bedroom and bathroom) on an as needed basis. Your caregiver/other person should wait as long as possible (at least several hours) and wear a mask before entering, cleaning, and disinfecting shared spaces that you use. Clean and disinfect areas that may have blood, stool, or body fluids on them. Use household cleaners and disinfectants. Clean visible dirty surfaces with household cleaners containing soap or detergent. Then, use a household disinfectant. Use a product from [employer] List N: Disinfectants for Coronavirus (COVID-19). Be sure to follow the instructions on the label to ensure safe and effective use of the product. Many products recommend keeping the surface wet with a disinfectant for a certain period of time (look at contact time on the product label). You may also need to wear personal protective equipment, such as gloves, depending on the directions on the product label. Immediately after disinfecting, wash your hands with soap and water for 20 seconds. For completed guidance on  cleaning and disinfecting your home, visit Complete Disinfection Guidance. Take steps to improve ventilation at home Improve ventilation (air flow) at home to help prevent from spreading COVID-19 to other people in your household. Clear out COVID-19 virus particles in the air by opening windows, using air filters, and turning on fans in your home. Use this interactive tool to learn how to improve air flow in your home. When you can be around others after being sick with COVID-19 Deciding when you can be around others is different for different situations. Find out when you can safely end home isolation. For any additional questions about your care, contact your healthcare provider or state or local health department. 12/11/2020 Content source: [facility] ([facility]), Division of Viral Diseases This information is not intended to replace advice given to you by your health care provider. Make sure you discuss any questions you have with your health care provider. Document Revised: 01/24/2021 Document Reviewed: 01/24/2021 Elsevier Patient Education  2022 [employer].     If you have been instructed to have an in-person evaluation today at a local Urgent Care facility, please use the link below. It will take you to a list of all of our available [facility], including address, phone number and hours of operation. Please do not delay care.  [facility]  If you or a family member do not have a primary care provider, use the link below to schedule a visit and establish care. When you choose a [facility] primary care physician or advanced practice provider, you gain a long-term partner in health. Find a Primary Care Provider  Learn more about [facility]'s in-office and virtual care options: [facility] - Get Care Now

## 2024-06-28 NOTE — Progress Notes (Signed)
 Virtual Visit Consent   [name], you are scheduled for a virtual visit with a [facility] provider today. Just as with appointments in the office, your consent must be obtained to participate. Your consent will be active for this visit and any virtual visit you may have with one of our providers in the next 365 days. If you have a MyChart account, a copy of this consent can be sent to you electronically.  As this is a virtual visit, video technology does not allow for your provider to perform a traditional examination. This may limit your provider's ability to fully assess your condition. If your provider identifies any concerns that need to be evaluated in person or the need to arrange testing (such as labs, EKG, etc.), we will make arrangements to do so. Although advances in technology are sophisticated, we cannot ensure that it will always work on either your end or our end. If the connection with a video visit is poor, the visit may have to be switched to a telephone visit. With either a video or telephone visit, we are not always able to ensure that we have a secure connection.  By engaging in this virtual visit, you consent to the provision of healthcare and authorize for your insurance to be billed (if applicable) for the services provided during this visit. Depending on your insurance coverage, you may receive a charge related to this service.  I need to obtain your verbal consent now. Are you willing to proceed with your visit today? [name] has provided verbal consent on 06/28/2024 for a virtual visit (video or telephone). [name], NP  Date: 06/28/2024 11:14 AM   Virtual Visit via Video Note   I, [name], connected with  [name]  ([id number], 10/07/1975) on 06/28/24 at 11:15 AM EDT by a video-enabled telemedicine application and verified that I am speaking with the correct person using two identifiers.  Location: Patient: Virtual Visit Location  Patient: Home Provider: Virtual Visit Location Provider: Home Office   I discussed the limitations of evaluation and management by telemedicine and the availability of in person appointments. The patient expressed understanding and agreed to proceed.    History of Present Illness: [name] is a 48 y.o. who identifies as a female who was assigned female at birth, and is being seen today for COVID  Onset was 2 days ago with congestion nose, sneezing, throat pain, and headache and ear pain Modifying factors are daily meds  Denies chest pain, shortness of breath, fevers, chills COVID test: +   Has had covid prior twice- tolerates pax prior.  Updated labs GFR 110 in Aug     Problems:  Patient Active Problem List   Diagnosis Date Noted   Carpal tunnel syndrome of right wrist 02/13/2024   Low back pain 01/01/2024   Abdominal pain, lower 10/22/2023   Localized edema 07/23/2023   Hip pain, right 06/25/2023   Dental caries 06/05/2023   Hypertriglyceridemia 03/10/2023   Essential (primary) hypertension 09/23/2022   Thrombocytosis 05/15/2022   Erythrocytosis 05/15/2022   Steatosis of liver 03/06/2022   Increased frequency of urination 03/04/2022   Other specified anxiety disorders 07/24/2021   Postsurgical menopause 12/27/2020   Status post surgery 08/29/2019   Attention deficit hyperactivity disorder (ADHD), combined type 03/15/2019   Adrenal mass, 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   GI bleed 03/23/2018   Knee pain, right 02/24/2018   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 01/01/2018  Hx of abnormal mammogram 01/01/2018   Tenosynovitis of wrist 07/17/2016   Depressive disorder 10/17/2015   Tobacco use disorder 03/31/2014   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   Leukocytosis 10/03/2008   Allergic rhinitis 10/03/2008    Allergies:  Allergies  Allergen Reactions   Hydrocodone  Other (See Comments)    Other Reaction(s): emesis (given after fall  and injury R leg)  hydrocodone    Medications:  Current Outpatient Medications:    albuterol  (VENTOLIN  HFA) 108 (90 Base) MCG/ACT inhaler, Inhale 1-2 puffs into the lungs every 6 (six) hours as needed for wheezing or shortness of breath (or cough)., Disp: 1 each, Rfl: 11   benzonatate  (TESSALON ) 100 MG capsule, Take 1 capsule (100 mg total) by mouth 3 (three) times daily as needed for cough., Disp: 30 capsule, Rfl: 0   budesonide-glycopyrrolate -formoterol (BREZTRI  AEROSPHERE) 160-9-4.8 MCG/ACT AERO inhaler, Inhale 2 puffs into the lungs in the morning and at bedtime., Disp: 10.7 g, Rfl: 11   gabapentin (NEURONTIN) 300 MG capsule, Take 300 mg by mouth at bedtime., Disp: , Rfl:    predniSONE  (DELTASONE ) 20 MG tablet, Take 2 tablets (40 mg total) by mouth daily with breakfast., Disp: 10 tablet, Rfl: 0  Observations/Objective: Patient is well-developed, well-nourished in no acute distress.  Resting comfortably  at home.  Head is normocephalic, atraumatic.  No labored breathing.  Speech is clear and coherent with logical content.  Patient is alert and oriented at baseline.    Assessment and Plan:   1. COVID (Primary)  - nirmatrelvir/ritonavir (PAXLOVID) 20 x 150 MG & 10 x 100MG  TABS; Take 3 tablets by mouth 2 (two) times daily for 5 days. (Take nirmatrelvir 150 mg two tablets twice daily for 5 days and ritonavir 100 mg one tablet twice daily for 5 days) Patient GFR is 110 8/25  Dispense: 30 tablet; Refill: 0 - benzonatate  (TESSALON ) 100 MG capsule; Take 1 capsule (100 mg total) by mouth 3 (three) times daily as needed for cough.  Dispense: 30 capsule; Refill: 0 - fluticasone  (FLONASE ) 50 MCG/ACT nasal spray; Place 2 sprays into both nostrils daily.  Dispense: 16 g; Refill: 0    - Continue OTC symptomatic management of choice - Info for COVID sent on AVS as well - Take prescribed medications as directed - Push fluids - Rest as needed - Discussed return precautions and when to seek  in-person evaluation, sent via AVS as well - work note provided   Reviewed side effects, risks and benefits of medication.    Patient acknowledged agreement and understanding of the plan.   Past Medical, Surgical, Social History, Allergies, and Medications have been Reviewed.    Follow Up Instructions: I discussed the assessment and treatment plan with the patient. The patient was provided an opportunity to ask questions and all were answered. The patient agreed with the plan and demonstrated an understanding of the instructions.  A copy of instructions were sent to the patient via MyChart unless otherwise noted below.    The patient was advised to call back or seek an in-person evaluation if the symptoms worsen or if the condition fails to improve as anticipated.    [name], NP

## 2024-06-28 NOTE — Telephone Encounter (Signed)
 So it appears that she does have issues with cholesterol management if the sample was lipemic it means that she needs to be evaluated by primary care for high cholesterol and placed on cholesterol medication.  She should wait until she is feeling better to have the testing done.  She can do this testing when she comes back for follow-up appointment and that would probably be best.  Otherwise there is a risk of the test being canceled again.

## 2024-06-28 NOTE — Telephone Encounter (Signed)
 We did not cancel the test on our side.  This must have been done on the labs side.  Unfortunately she will have to have this redrawn and I will have to reorder it.  We will also have to figure out why the lab canceled this.

## 2024-06-28 NOTE — Addendum Note (Signed)
 Addended by: TAMEA DEDRA CROME on: 06/28/2024 08:48 AM   Modules accepted: Orders

## 2024-06-28 NOTE — Progress Notes (Signed)
 We are sorry you are not feeling well.  Here is how we plan to help!  Based on what you have shared with me, it looks like you may have a viral upper respiratory infection.  Upper respiratory infections are caused by a large number of viruses; however, rhinovirus is the most common cause.   Symptoms vary from person to person, with common symptoms including sore throat, cough, and fatigue or lack of energy.  A low-grade fever of up to 100.4 may present, but is often uncommon.  Symptoms vary however, and are closely related to a person's age or underlying illnesses.  The most common symptoms associated with an upper respiratory infection are nasal discharge or congestion, cough, sneezing, headache and pressure in the ears and face.  These symptoms usually persist for about 3 to 10 days, but can last up to 2 weeks.  It is important to know that upper respiratory infections do not cause serious illness or complications in most cases.    Upper respiratory infections can be transmitted from person to person, with the most common method of transmission being a person's hands.  The virus is able to live on the skin and can infect other persons for up to 2 hours after direct contact.  Also, these can be transmitted when someone coughs or sneezes; thus, it is important to cover the mouth to reduce this risk.  To keep the spread of the illness at bay, good hand hygiene is very important.  This is an infection that is most likely caused by a virus. There are no specific treatments other than to help you with the symptoms until the infection runs its course.  We are sorry you are not feeling well.  Here is how we plan to help!   For nasal congestion, you may use an oral decongestants such as Mucinex D or if you have glaucoma or high blood pressure use plain Mucinex.  Saline nasal spray or nasal drops can help and can safely be used as often as needed for congestion.   If you do not have a history of heart disease,  hypertension, diabetes or thyroid disease, prostate/bladder issues or glaucoma, you may also use Sudafed to treat nasal congestion.  It is highly recommended that you consult with a pharmacist or your primary care physician to ensure this medication is safe for you to take.     If you have a cough, you may use cough suppressants such as Delsym  and Robitussin.  If you have glaucoma or high blood pressure, you can also use Coricidin HBP.    If you have a sore or scratchy throat, use a saltwater gargle-  to  teaspoon of salt dissolved in a 4-ounce to 8-ounce glass of warm water.  Gargle the solution for approximately 15-30 seconds and then spit.  It is important not to swallow the solution.  You can also use throat lozenges/cough drops and Chloraseptic spray to help with throat pain or discomfort.  Warm or cold liquids can also be helpful in relieving throat pain.  For headache, pain or general discomfort, you can use Ibuprofen  or Tylenol  as directed.   Some authorities believe that zinc sprays or the use of Echinacea may shorten the course of your symptoms.  I have sent a work note to Pharmacologist. You can find by going to the Menu on your homepage, scrolling down to the Communications section, and selecting Letters. Let us  know if you have any issue locating. Take care  and feel better soon!   HOME CARE Only take medications as instructed by your medical team. Be sure to drink plenty of fluids. Water is fine as well as fruit juices, sodas and electrolyte beverages. You may want to stay away from caffeine or alcohol. If you are nauseated, try taking small sips of liquids. How do you know if you are getting enough fluid? Your urine should be a pale yellow or almost colorless. Get rest. Taking a steamy shower or using a humidifier may help nasal congestion and ease sore throat pain. You can place a towel over your head and breathe in the steam from hot water coming from a faucet. Using a saline nasal  spray works much the same way. Cough drops, hard candies and sore throat lozenges may ease your cough. Avoid close contacts especially the very young and the elderly Cover your mouth if you cough or sneeze Always remember to wash your hands.   GET HELP RIGHT AWAY IF: You develop worsening fever. If your symptoms do not improve within 10 days You develop yellow or green discharge from your nose over 3 days. You have coughing fits You develop a severe head ache or visual changes. You develop shortness of breath, difficulty breathing or start having chest pain Your symptoms persist after you have completed your treatment plan  MAKE SURE YOU  Understand these instructions. Will watch your condition. Will get help right away if you are not doing well or get worse.  Your e-visit answers were reviewed by a board certified advanced clinical practitioner to complete your personal care plan. Depending upon the condition, your plan could have included both over the counter or prescription medications. Please review your pharmacy choice. If there is a problem, you may call our nursing hot line at and have the prescription routed to another pharmacy. Your safety is important to us . If you have drug allergies check your prescription carefully.   You can use MyChart to ask questions about today's visit, request a non-urgent call back, or ask for a work or school excuse for 24 hours related to this e-Visit. If it has been greater than 24 hours you will need to follow up with your provider, or enter a new e-Visit to address those concerns. You will get an e-mail in the next two days asking about your experience.  I hope that your e-visit has been valuable and will speed your recovery. Thank you for using e-visits.   I have spent 5 minutes in review of e-visit questionnaire, review and updating patient chart, medical decision making and response to patient.   Elsie Velma Lunger, PA-C

## 2024-07-16 ENCOUNTER — Telehealth: Admitting: Physician Assistant

## 2024-07-16 DIAGNOSIS — J069 Acute upper respiratory infection, unspecified: Secondary | ICD-10-CM

## 2024-07-16 DIAGNOSIS — U071 COVID-19: Secondary | ICD-10-CM | POA: Diagnosis not present

## 2024-07-16 MED ORDER — PSEUDOEPH-BROMPHEN-DM 30-2-10 MG/5ML PO SYRP: 2.5000 mL | ORAL_SOLUTION | Freq: Four times a day (QID) | ORAL | 0 refills | Status: AC | PRN

## 2024-07-16 MED ORDER — FLUTICASONE PROPIONATE 50 MCG/ACT NA SUSP: 2.0000 | Freq: Every day | NASAL | 6 refills | Status: AC

## 2024-07-16 NOTE — Progress Notes (Signed)

## 2024-07-24 ENCOUNTER — Other Ambulatory Visit: Payer: Self-pay | Admitting: Medical Genetics

## 2024-07-24 DIAGNOSIS — Z006 Encounter for examination for normal comparison and control in clinical research program: Secondary | ICD-10-CM

## 2024-07-31 ENCOUNTER — Encounter: Payer: Self-pay | Admitting: Emergency Medicine

## 2024-07-31 ENCOUNTER — Ambulatory Visit
Admission: EM | Admit: 2024-07-31 | Discharge: 2024-07-31 | Disposition: A | Discharge status: Home / Self Care | Attending: Emergency Medicine | Admitting: Emergency Medicine

## 2024-07-31 ENCOUNTER — Telehealth: Payer: Self-pay | Admitting: Emergency Medicine

## 2024-07-31 DIAGNOSIS — M654 Radial styloid tenosynovitis [de Quervain]: Secondary | ICD-10-CM | POA: Diagnosis not present

## 2024-07-31 DIAGNOSIS — G5603 Carpal tunnel syndrome, bilateral upper limbs: Secondary | ICD-10-CM | POA: Diagnosis not present

## 2024-07-31 MED ORDER — METHYLPREDNISOLONE 4 MG PO TBPK: ORAL_TABLET | Freq: Every day | ORAL | 0 refills | Status: AC

## 2024-07-31 MED ORDER — IBUPROFEN 600 MG PO TABS: 600.0000 mg | ORAL_TABLET | Freq: Four times a day (QID) | ORAL | 0 refills | Status: AC | PRN

## 2024-07-31 NOTE — ED Provider Notes (Incomplete)
 HPI  SUBJECTIVE:  [name] is a right-handed 48 y.o. female who presents with bilateral hands falling asleep for the past week with activity.  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  She reports bilateral hand swelling.  No grip weakness.  She is waking up at night with her wrists flexed.  She also reports right radial wrist pain primarily present with holding heavy things and with ulnar deviation.  She has tried over-the-counter braces, Naprosyn  500 mg twice daily and 300 to 400 mg of Neurontin which is prescribed to her for her to take at night.  She states that the Neurontin helps, but makes her sleepy.  Symptoms are worse with use.  She has a past medical history of benign liver neoplasm, wrist tenosynovitis, status post hysterectomy, asthma/COPD, bilateral carpal tunnel syndrome which has been evaluated by orthopedics earlier this year.  She states that the GI bleed was found to be due to hemorrhoids, not a true GI bleed.  [facility]  Past Medical History:  Diagnosis Date   Depression    Focal nodular hyperplasia determined by liver biopsy    Leukocytosis    Tobacco use     Past Surgical History:  Procedure Laterality Date   ABDOMINAL HYSTERECTOMY     BREAST BIOPSY Right 2016   us  core, fat necrosis   COLONOSCOPY WITH PROPOFOL  N/A 03/25/2018   Procedure: COLONOSCOPY WITH PROPOFOL ;  Surgeon: Toledo, Ladell POUR, MD;  Location: [facility];  Service: Gastroenterology;  Laterality: N/A;   ESOPHAGOGASTRODUODENOSCOPY (EGD) WITH PROPOFOL  N/A 03/25/2018   Procedure: ESOPHAGOGASTRODUODENOSCOPY (EGD) WITH PROPOFOL ;  Surgeon: Toledo, Ladell POUR, MD;  Location: [facility];  Service: Gastroenterology;  Laterality: N/A;   INTRAUTERINE DEVICE INSERTION     IUD REMOVAL  08/29/2019   Procedure: INTRAUTERINE DEVICE (IUD) REMOVAL;  Surgeon: Janit Alm Agent, MD;  Location: [facility];  Service: Gynecology;;   KNEE CARTILAGE  SURGERY Right 02/2015   LAPAROSCOPIC ASSISTED VAGINAL HYSTERECTOMY N/A 08/29/2019   Procedure: LAPAROSCOPIC ASSISTED VAGINAL HYSTERECTOMY;  Surgeon: Janit Alm Agent, MD;  Location: [facility];  Service: Gynecology;  Laterality: N/A;   LAPAROSCOPIC BILATERAL SALPINGO OOPHERECTOMY Bilateral 08/29/2019   Procedure: LAPAROSCOPIC BILATERAL SALPINGO OOPHORECTOMY;  Surgeon: Janit Alm Agent, MD;  Location: [facility];  Service: Gynecology;  Laterality: Bilateral;   LAPAROSCOPIC TUBAL LIGATION      Family History  Problem Relation Age of Onset   Hypertension Mother    Prostate cancer Father    Hypertension Father    Breast cancer Maternal Grandmother 62   Colon cancer Neg Hx     Social History   Tobacco Use   Smoking status: Every Day    Current packs/day: 1.00    Average packs/day: 1 pack/day for 20.9 years (20.9 ttl pk-yrs)    Types: Cigarettes    Start date: 2005   Smokeless tobacco: Never  Vaping Use   Vaping status: Never Used  Substance Use Topics   Alcohol use: No    Alcohol/week: 0.0 standard drinks of alcohol   Drug use: No    No current facility-administered medications for this encounter.  Current Outpatient Medications:    hydrOXYzine (VISTARIL) 25 MG capsule, Take 25 mg by mouth 3 (three) times daily as needed., Disp: , Rfl:    ibuprofen  (ADVIL ) 600 MG tablet, Take 1 tablet (600 mg total) by mouth every 6 (six) hours as needed., Disp: 30 tablet, Rfl: 0   lisinopril (ZESTRIL)  10 MG tablet, Take 10 mg by mouth daily., Disp: , Rfl:    methylPREDNISolone  (MEDROL  DOSEPAK) 4 MG TBPK tablet, Take by mouth daily. Follow package instructions, Disp: 21 tablet, Rfl: 0   albuterol  (VENTOLIN  HFA) 108 (90 Base) MCG/ACT inhaler, Inhale 1-2 puffs into the lungs every 6 (six) hours as needed for wheezing or shortness of breath (or cough)., Disp: 1 each, Rfl: 11   budesonide-glycopyrrolate -formoterol (BREZTRI  AEROSPHERE) 160-9-4.8 MCG/ACT AERO inhaler, Inhale 2 puffs into the lungs in the  Jasmine and at bedtime., Disp: 10.7 g, Rfl: 11   fluticasone  (FLONASE ) 50 MCG/ACT nasal spray, Place 2 sprays into both nostrils daily., Disp: 16 g, Rfl: 0   fluticasone  (FLONASE ) 50 MCG/ACT nasal spray, Place 2 sprays into both nostrils daily., Disp: 16 g, Rfl: 6   gabapentin (NEURONTIN) 300 MG capsule, Take 300 mg by mouth at bedtime., Disp: , Rfl:   Allergies  Allergen Reactions   Hydrocodone  Other (See Comments)    Other Reaction(s): emesis (given after fall and injury R leg)  hydrocodone      ROS  As noted in HPI.   Physical Exam  BP (!) 141/84 (BP Location: Left Arm)   Pulse (!) 102   Temp 98.8 F (37.1 C) (Oral)   Resp 16   Ht 5' 2 (1.575 m)   Wt 72.9 kg   LMP 08/22/2019   SpO2 99%   BMI 29.40 kg/m   Constitutional: Well developed, well nourished, no acute distress Eyes:  EOMI, conjunctiva normal bilaterally HENT: Normocephalic, atraumatic,mucus membranes moist Respiratory: Normal inspiratory effort Cardiovascular: Normal rate GI: nondistended skin: No rash, skin intact Musculoskeletal: no deformities  R   distal radius NT , distal ulnar styloid NT  snuffbox NT, APB/EPB tender, carpals tender, metacarpals NT, digits NT , TFCC NT .  pain with supination,   pain with pronation, pain  with radial / ulnar deviation.  Pain  with flexion/extension.  Motor intact in the median/radial/ulnar distribution, Sensation LT to hand normal. Rp 2+.  No bruising, erythema, edema.  Tinel pos . Phalen pos.  Finklestein Pos.    L  distal radius NT, distal ulnar styloid NT , snuffbox NT, carpals NT, metacarpals NT, digits NT /, TFCC NT.  pain  with supination,  no pain with pronation, pain  with radial / ulnar deviation.  Painwith flexion/extension.  Motor intact in the median/radial/ulnar distribution, Sensation LT to hand normal.. Rp 2+.  No bruising, erythema, edema.  Tinel pos Phalen pos.  Finklestein neg.  Neurologic: Alert & oriented x 3, no focal neuro deficits Psychiatric:  Speech and behavior appropriate   ED Course   Medications - No data to display  Orders Placed This Encounter  Procedures   Apply Wrist brace    Cock-up splint left    Standing Status:   Standing    Number of Occurrences:   1    Laterality:   Right   Thumb spica    Standing Status:   Standing    Number of Occurrences:   1    Laterality:   Right    No results found for this or any previous visit (from the past 24 hours). No results found.  ED Clinical Impression  1. Bilateral carpal tunnel syndrome   2. De Quervain's tenosynovitis, right      ED Assessment/Plan     Outside records reviewed.  Additional medical history obtained.  As noted in HPI.  This is an acute flare of a chronic problem  Patient has bilateral carpal tunnel syndrome.  She also has de Quervain's tenosynovitis on the right.  Thumb spica splint on the right, cock-up splint on the left. Will send home with ibuprofen /Tylenol , Medrol  Dosepak, rest.  She is off until Thursday.  Follow-up with [facility] if not better in a week.  She denies history of GI bleed, states that was coming from hemorrhoids and that is safe for her to take NSAIDs.  She can try 100 mg of Neurontin during the day as she states that this helped her.  Discussed  MDM, treatment plan, and plan for follow-up with patient. patient agrees with plan.   Meds ordered this encounter  Medications   methylPREDNISolone  (MEDROL  DOSEPAK) 4 MG TBPK tablet    Sig: Take by mouth daily. Follow package instructions    Dispense:  21 tablet    Refill:  0   ibuprofen  (ADVIL ) 600 MG tablet    Sig: Take 1 tablet (600 mg total) by mouth every 6 (six) hours as needed.    Dispense:  30 tablet    Refill:  0      *This clinic note was created using Scientist, clinical (histocompatibility and immunogenetics). Therefore, there may be occasional mistakes despite careful proofreading.  ?    Van Knee, MD 08/01/24 [id number]    Van Knee, MD 08/01/24 [phone]

## 2024-07-31 NOTE — Discharge Instructions (Signed)
 Take 1 mg of ibuprofen , 1000 mg of Tylenol  together 3-4 times a day.  Try 100 mg of Neurontin once or twice during the day to see if that helps.  Finish the steroids, even if you feel better.  Wear the splints at all times.  If you are not better in a week, please follow-up with [facility].

## 2024-07-31 NOTE — ED Triage Notes (Signed)
 Pt states she is having bilateral hand numbness. Started about a week ago. She states her hands feel like they go to sleep with doing stuff all day. She states she has known carpel tunnel.

## 2024-09-13 ENCOUNTER — Telehealth: Admitting: Nurse Practitioner

## 2024-09-13 DIAGNOSIS — R051 Acute cough: Secondary | ICD-10-CM

## 2024-09-13 NOTE — Progress Notes (Signed)
   Thank you for the details you included in the comment boxes. Those details are very helpful in determining the best course of treatment for you and help us  to provide the best care. We recommend that you schedule a Virtual Urgent Care video visit in order for the provider to better assess what is going on.  The provider will be able to give you a more accurate diagnosis and treatment plan if we can more freely discuss your symptoms and with the addition of a virtual examination.   If you change your visit to a video visit, we will bill your insurance (similar to an office visit) and you will not be charged for this e-Visit. You will be able to stay at home and speak with the first available [facility] advanced practice provider. The link to do a video visit is in the drop down Menu tab of your Welcome screen in MyChart.

## 2024-09-15 ENCOUNTER — Telehealth: Admitting: Nurse Practitioner

## 2024-09-15 DIAGNOSIS — J440 Chronic obstructive pulmonary disease with acute lower respiratory infection: Secondary | ICD-10-CM | POA: Diagnosis not present

## 2024-09-15 DIAGNOSIS — J209 Acute bronchitis, unspecified: Secondary | ICD-10-CM

## 2024-09-15 MED ORDER — AZITHROMYCIN 250 MG PO TABS: ORAL_TABLET | ORAL | 0 refills | Status: AC

## 2024-09-15 MED ORDER — BENZONATATE 100 MG PO CAPS: 100.0000 mg | ORAL_CAPSULE | Freq: Three times a day (TID) | ORAL | 0 refills | Status: AC | PRN

## 2024-09-15 MED ORDER — PREDNISONE 20 MG PO TABS: 20.0000 mg | ORAL_TABLET | Freq: Two times a day (BID) | ORAL | 0 refills | Status: AC

## 2024-09-15 NOTE — Progress Notes (Signed)
 " Virtual Visit Consent   [name], you are scheduled for a virtual visit with a [facility] provider today. Just as with appointments in the office, your consent must be obtained to participate. Your consent will be active for this visit and any virtual visit you may have with one of our providers in the next 365 days. If you have a MyChart account, a copy of this consent can be sent to you electronically.  As this is a virtual visit, video technology does not allow for your provider to perform a traditional examination. This may limit your provider's ability to fully assess your condition. If your provider identifies any concerns that need to be evaluated in person or the need to arrange testing (such as labs, EKG, etc.), we will make arrangements to do so. Although advances in technology are sophisticated, we cannot ensure that it will always work on either your end or our end. If the connection with a video visit is poor, the visit may have to be switched to a telephone visit. With either a video or telephone visit, we are not always able to ensure that we have a secure connection.  By engaging in this virtual visit, you consent to the provision of healthcare and authorize for your insurance to be billed (if applicable) for the services provided during this visit. Depending on your insurance coverage, you may receive a charge related to this service.  I need to obtain your verbal consent now. Are you willing to proceed with your visit today? [name] has provided verbal consent on 09/15/2024 for a virtual visit (video or telephone). Lauraine Kitty, FNP  Date: 09/15/2024 4:50 PM   Virtual Visit via Video Note   I, Lauraine Kitty, connected with  [name]  ([id number], Sep 18, 1976) on 09/15/2024 at  5:00 PM EST by a video-enabled telemedicine application and verified that I am speaking with the correct person using two identifiers.  Location: Patient: Virtual Visit Location  Patient: Home Provider: Virtual Visit Location Provider: Home Office   I discussed the limitations of evaluation and management by telemedicine and the availability of in person appointments. The patient expressed understanding and agreed to proceed.    History of Present Illness: [name] is a 48 y.o. who identifies as a female who was assigned female at birth, and is being seen today for cough, congestion and loss of voice  She has been sick the past week, noting that the mucous from her sinuses has been getting darker over the past 2 days. Her cough is productive, She is able to eat and drink without difficulty. She has post nasal drainage as well   She has been using allergy medicine and dayquil today with some relief   She has more of a sore throat today, she has had sweats no fever- feels mostly this is worsening her breathing. She has had pneumonia in the past year   She is in the process of COPD workup, currently using Albuterol  as needed and Breztri  morning and night. 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    HPI Problems:  Patient Active Problem List   Diagnosis Date Noted   Carpal tunnel syndrome of right wrist 02/13/2024   Low back pain 01/01/2024   Abdominal pain, lower 10/22/2023   Localized edema 07/23/2023   Hip pain, right 06/25/2023   Dental caries 06/05/2023   Hypertriglyceridemia 03/10/2023   Essential (primary) hypertension 09/23/2022   Thrombocytosis 05/15/2022  Erythrocytosis 05/15/2022   Steatosis of liver 03/06/2022   Increased frequency of urination 03/04/2022   Other specified anxiety disorders 07/24/2021   Postsurgical menopause 12/27/2020   Status post surgery 08/29/2019   Attention deficit hyperactivity disorder (ADHD), combined type 03/15/2019   Adrenal mass, 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  Resting comfortably  at home.  Head is normocephalic, atraumatic.  No labored breathing.  Speech is clear and coherent with logical content.  Patient is alert and oriented at baseline.    Assessment and Plan:  1. Acute bronchitis with COPD (HCC) (Primary) - benzonatate  (TESSALON ) 100 MG capsule; Take 1 capsule (100 mg total) by mouth 3 (three) times daily as needed.  Dispense: 30 capsule; Refill: 0 - azithromycin  (ZITHROMAX ) 250 MG tablet; Take 2 tablets on day 1, then 1 tablet daily on days 2 through 5  Dispense: 6 tablet; Refill: 0 - predniSONE  (DELTASONE ) 20 MG tablet; Take 1 tablet (20 mg total) by mouth 2 (two) times daily with a meal for 5 days.  Dispense: 10 tablet; Refill: 0   Follow Up Instructions: I discussed the assessment and treatment plan with the patient. The patient was provided an opportunity to ask questions and all were answered. The patient agreed with the plan and demonstrated an understanding of the instructions.  A copy of instructions were sent to the patient via MyChart unless otherwise noted below.    The patient was advised to call back or seek an in-person evaluation if the symptoms worsen or if the condition fails to improve as anticipated.    Lauraine Kitty, FNP     [1]  Allergies Allergen Reactions   Hydrocodone  Other (See Comments)    Other Reaction(s): emesis (given after fall and injury R leg)  hydrocodone   [2]  Current Outpatient Medications:    albuterol  (VENTOLIN  HFA) 108 (90 Base) MCG/ACT inhaler, Inhale 1-2 puffs into the lungs every 6  (six) hours as needed for wheezing or shortness of breath (or cough)., Disp: 1 each, Rfl: 11   budesonide-glycopyrrolate -formoterol (BREZTRI  AEROSPHERE) 160-9-4.8 MCG/ACT AERO inhaler, Inhale 2 puffs into the lungs in the morning and at bedtime., Disp: 10.7 g, Rfl: 11   fluticasone  (FLONASE ) 50 MCG/ACT nasal spray, Place 2 sprays into both nostrils daily., Disp: 16 g, Rfl: 0   fluticasone  (FLONASE ) 50 MCG/ACT nasal spray, Place 2 sprays into both nostrils daily., Disp: 16 g, Rfl: 6   gabapentin (NEURONTIN) 300 MG capsule, Take 300 mg by mouth at bedtime., Disp: , Rfl:    hydrOXYzine (VISTARIL) 25 MG capsule, Take 25 mg by mouth 3 (three) times daily as needed., Disp: , Rfl:    ibuprofen  (ADVIL ) 600 MG tablet, Take 1 tablet (600 mg total) by mouth every 6 (six) hours as needed., Disp: 30 tablet, Rfl: 0   lisinopril (ZESTRIL) 10 MG tablet, Take 10 mg by mouth daily., Disp: , Rfl:    methylPREDNISolone  (MEDROL  DOSEPAK) 4 MG TBPK tablet, Take by mouth daily. Follow package instructions, Disp: 21 tablet, Rfl: 0  "

## 2024-09-17 ENCOUNTER — Telehealth: Admitting: Nurse Practitioner

## 2024-09-17 DIAGNOSIS — J40 Bronchitis, not specified as acute or chronic: Secondary | ICD-10-CM

## 2024-09-17 NOTE — Progress Notes (Signed)
 I have provided a work note for you. You will find in under letters in your mychart.  In regard to tamiflu , this can only be prescribed if you have a positive test result for flu.   You will not be charged for this evisit.

## 2024-09-30 ENCOUNTER — Encounter: Payer: Self-pay | Admitting: Pulmonary Disease

## 2024-09-30 NOTE — Telephone Encounter (Signed)
 She did not follow-up so I recommend.  If she is having current issues I would recommend urgent care and they can provide a note for the immediate issue.  The letter that I wrote for her does cover for flareups.  Since I have not seen her since September I cannot write an update for her current issue.
